# Patient Record
Sex: Male | Born: 1945
Health system: Southern US, Community
[De-identification: ages and names within clinical notes are randomized; demographics above are authoritative.]

## PROBLEM LIST (undated history)

## (undated) HISTORY — PX: TONSILLECTOMY AND ADENOIDECTOMY: SUR1326

---

## 1998-04-12 HISTORY — PX: BACK SURGERY: SHX140

## 1999-04-11 ENCOUNTER — Emergency Department (HOSPITAL_COMMUNITY): Admission: EM | Admit: 1999-04-11 | Discharge: 1999-04-11 | Payer: Self-pay | Admitting: Emergency Medicine

## 1999-04-11 ENCOUNTER — Encounter: Payer: Self-pay | Admitting: Emergency Medicine

## 1999-04-12 ENCOUNTER — Encounter: Payer: Self-pay | Admitting: Family Medicine

## 1999-04-12 ENCOUNTER — Ambulatory Visit (HOSPITAL_COMMUNITY): Admission: RE | Admit: 1999-04-12 | Discharge: 1999-04-12 | Payer: Self-pay | Admitting: Family Medicine

## 1999-04-26 ENCOUNTER — Encounter: Payer: Self-pay | Admitting: Neurosurgery

## 1999-04-26 ENCOUNTER — Ambulatory Visit (HOSPITAL_COMMUNITY): Admission: RE | Admit: 1999-04-26 | Discharge: 1999-04-26 | Payer: Self-pay | Admitting: Neurosurgery

## 2001-03-06 ENCOUNTER — Encounter: Payer: Self-pay | Admitting: Family Medicine

## 2001-03-06 ENCOUNTER — Encounter: Admission: RE | Admit: 2001-03-06 | Discharge: 2001-03-06 | Payer: Self-pay | Admitting: Family Medicine

## 2001-11-19 ENCOUNTER — Ambulatory Visit (HOSPITAL_COMMUNITY): Admission: RE | Admit: 2001-11-19 | Discharge: 2001-11-19 | Payer: Self-pay | Admitting: Orthopedic Surgery

## 2001-11-19 ENCOUNTER — Encounter: Payer: Self-pay | Admitting: Orthopedic Surgery

## 2002-11-23 ENCOUNTER — Encounter: Payer: Self-pay | Admitting: Family Medicine

## 2002-11-23 LAB — CONVERTED CEMR LAB: PSA: 0.4 ng/mL

## 2004-04-20 ENCOUNTER — Ambulatory Visit: Payer: Self-pay | Admitting: Family Medicine

## 2004-05-08 ENCOUNTER — Ambulatory Visit: Payer: Self-pay | Admitting: Family Medicine

## 2004-11-09 ENCOUNTER — Ambulatory Visit: Payer: Self-pay | Admitting: Family Medicine

## 2005-10-14 ENCOUNTER — Ambulatory Visit: Payer: Self-pay | Admitting: Family Medicine

## 2007-02-09 ENCOUNTER — Encounter: Payer: Self-pay | Admitting: Family Medicine

## 2007-02-09 DIAGNOSIS — M1990 Unspecified osteoarthritis, unspecified site: Secondary | ICD-10-CM | POA: Insufficient documentation

## 2007-02-16 ENCOUNTER — Ambulatory Visit: Payer: Self-pay | Admitting: Family Medicine

## 2007-02-18 LAB — CONVERTED CEMR LAB
AST: 33 units/L (ref 0–37)
Albumin: 4 g/dL (ref 3.5–5.2)
Alkaline Phosphatase: 62 units/L (ref 39–117)
BUN: 18 mg/dL (ref 6–23)
Basophils Absolute: 0.1 10*3/uL (ref 0.0–0.1)
Chloride: 106 meq/L (ref 96–112)
Cholesterol: 198 mg/dL (ref 0–200)
Creatinine, Ser: 0.9 mg/dL (ref 0.4–1.5)
Eosinophils Absolute: 0.2 10*3/uL (ref 0.0–0.6)
GFR calc non Af Amer: 91 mL/min
HDL: 52.8 mg/dL (ref >39.0)
LDL Cholesterol: 134 mg/dL — ABNORMAL HIGH (ref 0–99)
MCHC: 35.2 g/dL (ref 30.0–36.0)
MCV: 86.5 fL (ref 78.0–100.0)
Monocytes Relative: 6.9 % (ref 3.0–11.0)
Neutrophils Relative %: 68.6 % (ref 43.0–77.0)
PSA: 0.61 ng/mL (ref 0.10–4.00)
Platelets: 214 10*3/uL (ref 150–400)
Potassium: 3.7 meq/L (ref 3.5–5.1)
RBC: 4.94 M/uL (ref 4.22–5.81)
RDW: 12 % (ref 11.5–14.6)
Sodium: 143 meq/L (ref 135–145)
TSH: 1 microintl units/mL (ref 0.35–5.50)
Total Bilirubin: 1 mg/dL (ref 0.3–1.2)
Total CHOL/HDL Ratio: 3.8
Triglycerides: 55 mg/dL (ref 0–149)

## 2007-03-04 ENCOUNTER — Encounter: Payer: Self-pay | Admitting: Family Medicine

## 2007-03-31 ENCOUNTER — Ambulatory Visit: Payer: Self-pay | Admitting: Family Medicine

## 2007-04-01 ENCOUNTER — Encounter (INDEPENDENT_AMBULATORY_CARE_PROVIDER_SITE_OTHER): Payer: Self-pay | Admitting: *Deleted

## 2007-04-20 ENCOUNTER — Ambulatory Visit: Payer: Self-pay | Admitting: Family Medicine

## 2007-04-20 DIAGNOSIS — E782 Mixed hyperlipidemia: Secondary | ICD-10-CM | POA: Insufficient documentation

## 2007-04-20 DIAGNOSIS — I10 Essential (primary) hypertension: Secondary | ICD-10-CM

## 2007-07-20 ENCOUNTER — Ambulatory Visit: Payer: Self-pay | Admitting: Family Medicine

## 2007-09-22 ENCOUNTER — Ambulatory Visit: Payer: Self-pay | Admitting: Family Medicine

## 2007-09-24 LAB — CONVERTED CEMR LAB
HDL: 46.1 mg/dL (ref >39.0)
Triglycerides: 42 mg/dL (ref 0–149)
VLDL: 8 mg/dL (ref 0–40)

## 2008-01-21 ENCOUNTER — Ambulatory Visit: Payer: Self-pay | Admitting: Family Medicine

## 2008-07-14 ENCOUNTER — Encounter (INDEPENDENT_AMBULATORY_CARE_PROVIDER_SITE_OTHER): Payer: Self-pay | Admitting: *Deleted

## 2008-07-18 ENCOUNTER — Ambulatory Visit: Payer: Self-pay | Admitting: Family Medicine

## 2008-07-26 ENCOUNTER — Ambulatory Visit: Payer: Self-pay | Admitting: Family Medicine

## 2008-07-27 LAB — CONVERTED CEMR LAB
ALT: 25 units/L (ref 0–53)
AST: 27 units/L (ref 0–37)
Alkaline Phosphatase: 59 units/L (ref 39–117)
BUN: 16 mg/dL (ref 6–23)
Bilirubin, Direct: 0.1 mg/dL (ref 0.0–0.3)
Calcium: 9 mg/dL (ref 8.4–10.5)
Cholesterol: 191 mg/dL (ref 0–200)
Creatinine, Ser: 1 mg/dL (ref 0.4–1.5)
Eosinophils Relative: 5.1 % — ABNORMAL HIGH (ref 0.0–5.0)
GFR calc non Af Amer: 80.27 mL/min (ref >60)
LDL Cholesterol: 129 mg/dL — ABNORMAL HIGH (ref 0–99)
Lymphocytes Relative: 22 % (ref 12.0–46.0)
Monocytes Absolute: 0.5 10*3/uL (ref 0.1–1.0)
Monocytes Relative: 9.1 % (ref 3.0–12.0)
Neutrophils Relative %: 63.5 % (ref 43.0–77.0)
PSA: 1.01 ng/mL (ref 0.10–4.00)
Platelets: 168 10*3/uL (ref 150.0–400.0)
RBC: 5.3 M/uL (ref 4.22–5.81)
Total Bilirubin: 1.2 mg/dL (ref 0.3–1.2)
Total CHOL/HDL Ratio: 4
Triglycerides: 52 mg/dL (ref 0.0–149.0)
VLDL: 10.4 mg/dL (ref 0.0–40.0)
WBC: 5 10*3/uL (ref 4.5–10.5)

## 2008-08-05 ENCOUNTER — Ambulatory Visit: Payer: Self-pay | Admitting: Gastroenterology

## 2008-08-19 ENCOUNTER — Ambulatory Visit: Payer: Self-pay | Admitting: Gastroenterology

## 2009-01-17 ENCOUNTER — Ambulatory Visit: Payer: Self-pay | Admitting: Family Medicine

## 2009-01-18 ENCOUNTER — Ambulatory Visit: Payer: Self-pay | Admitting: Family Medicine

## 2009-01-18 LAB — CONVERTED CEMR LAB
ALT: 24 units/L (ref 0–53)
HDL: 52.9 mg/dL (ref >39.00)
LDL Cholesterol: 124 mg/dL — ABNORMAL HIGH (ref 0–99)
VLDL: 7.6 mg/dL (ref 0.0–40.0)

## 2009-07-19 ENCOUNTER — Ambulatory Visit: Payer: Self-pay | Admitting: Family Medicine

## 2009-07-19 LAB — CONVERTED CEMR LAB
ALT: 46 units/L (ref 0–53)
AST: 38 units/L — ABNORMAL HIGH (ref 0–37)
Albumin: 3.9 g/dL (ref 3.5–5.2)
Alkaline Phosphatase: 62 units/L (ref 39–117)
Basophils Relative: 0.3 % (ref 0.0–3.0)
CO2: 31 meq/L (ref 19–32)
Calcium: 9.2 mg/dL (ref 8.4–10.5)
GFR calc non Af Amer: 80.01 mL/min (ref >60)
Hemoglobin: 15.7 g/dL (ref 13.0–17.0)
Lymphocytes Relative: 20.8 % (ref 12.0–46.0)
Monocytes Relative: 9.2 % (ref 3.0–12.0)
Neutro Abs: 3 10*3/uL (ref 1.4–7.7)
RBC: 5.16 M/uL (ref 4.22–5.81)
Sodium: 144 meq/L (ref 135–145)
Total CHOL/HDL Ratio: 3
Total Protein: 6.4 g/dL (ref 6.0–8.3)
Triglycerides: 45 mg/dL (ref 0.0–149.0)

## 2009-08-21 ENCOUNTER — Ambulatory Visit: Payer: Self-pay | Admitting: Family Medicine

## 2009-08-21 DIAGNOSIS — R7309 Other abnormal glucose: Secondary | ICD-10-CM

## 2009-09-10 HISTORY — PX: LIPOMA EXCISION: SHX5283

## 2009-09-27 ENCOUNTER — Ambulatory Visit (HOSPITAL_BASED_OUTPATIENT_CLINIC_OR_DEPARTMENT_OTHER): Admission: RE | Admit: 2009-09-27 | Discharge: 2009-09-27 | Payer: Self-pay | Admitting: General Surgery

## 2009-11-08 ENCOUNTER — Ambulatory Visit: Payer: Self-pay | Admitting: Family Medicine

## 2009-11-08 LAB — CONVERTED CEMR LAB
Glucose, Synovial Fluid: 90 mg/dL
Monocyte/Macrophage: 9 % — ABNORMAL LOW (ref 50–90)
Neutrophil, Synovial: 69 % — ABNORMAL HIGH (ref 0–25)

## 2009-11-11 ENCOUNTER — Ambulatory Visit: Payer: Self-pay | Admitting: Family Medicine

## 2009-11-30 ENCOUNTER — Ambulatory Visit: Payer: Self-pay | Admitting: Family Medicine

## 2009-12-04 LAB — CONVERTED CEMR LAB: Crystals, Fluid: NONE SEEN

## 2010-02-19 ENCOUNTER — Ambulatory Visit: Payer: Self-pay | Admitting: Family Medicine

## 2010-02-19 LAB — CONVERTED CEMR LAB
Glucose, Bld: 95 mg/dL (ref 70–99)
HDL: 56.8 mg/dL (ref >39.00)
LDL Cholesterol: 125 mg/dL — ABNORMAL HIGH (ref 0–99)
Total CHOL/HDL Ratio: 3
Triglycerides: 47 mg/dL (ref 0.0–149.0)

## 2010-02-21 ENCOUNTER — Ambulatory Visit: Payer: Self-pay | Admitting: Family Medicine

## 2010-04-30 ENCOUNTER — Ambulatory Visit: Payer: Self-pay | Admitting: Family Medicine

## 2010-04-30 DIAGNOSIS — M67919 Unspecified disorder of synovium and tendon, unspecified shoulder: Secondary | ICD-10-CM | POA: Insufficient documentation

## 2010-04-30 DIAGNOSIS — M719 Bursopathy, unspecified: Secondary | ICD-10-CM

## 2010-05-02 ENCOUNTER — Encounter
Admission: RE | Admit: 2010-05-02 | Discharge: 2010-05-02 | Payer: Self-pay | Source: Home / Self Care | Attending: Family Medicine | Admitting: Family Medicine

## 2010-05-13 HISTORY — PX: ROTATOR CUFF REPAIR: SHX139

## 2010-05-23 ENCOUNTER — Ambulatory Visit: Admit: 2010-05-23 | Payer: Self-pay | Admitting: Family Medicine

## 2010-05-24 ENCOUNTER — Encounter (INDEPENDENT_AMBULATORY_CARE_PROVIDER_SITE_OTHER): Payer: Self-pay | Admitting: *Deleted

## 2010-05-28 ENCOUNTER — Ambulatory Visit: Admit: 2010-05-28 | Payer: Self-pay | Admitting: Family Medicine

## 2010-05-28 ENCOUNTER — Encounter: Payer: Self-pay | Admitting: Family Medicine

## 2010-06-12 NOTE — Assessment & Plan Note (Signed)
Summary: FOLLOW UP AFTER LABS / LFW   Vital Signs:  Patient profile:   65 year old male Height:      73 inches Weight:      239.50 pounds BMI:     31.71 Temp:     97.8 degrees F oral Pulse rate:   64 / minute Pulse rhythm:   regular BP sitting:   124 / 80  (left arm) Cuff size:   large  Vitals Entered By: Lewanda Rife LPN (August 21, 2009 4:04 PM) CC: follow up after labs   History of Present Illness: here for f/u for chronic health problems and to review health mt list   feeling good -- overall working very hard  keeping schedule is harder than it used to be -- is hoping to cut back to 4 days per week  does not think he will ever totally retire   wt is up 4 lb with bmi 31 had been doing well with wt loss is not as good with his habits -- cuttng back some but not enough  he feels comfortable at this wt   eating has not gotten any worse  not as much exercise --  walks 5 miles per day at work he thinks - does try to get his heartrate up   bp great at 124/80-- this is stable with no problems   colonosc ok 2010- due 5 y  psa 1.91  no prostate problems  nocturia times one max -only if he drinks a lot  no problems with stream   Td 04   lipids up a little with LDL in 130s from 120s diet   sugar 111 - borderliine   ALT just a little high  drinks a lot of coffee with sugar and cream   Allergies: 1)  Parafon Forte  Past History:  Past Medical History: Last updated: 07/18/2008 Osteoarthritis HTN-- imp with diet and exercise/wt loss  hyperlipidemia- diet controlled  obesity  Past Surgical History: Last updated: 08/21/2008 Tonsillectomy MRI- spine- herniated disk (04/12/1999) Back surgery (04/26/1999) Colonoscopy (1999) Colonoscopy- diverticulosis, hemorrhoids (12/2002) (4/10) colonosc- tics, re check 5 y  Family History: Last updated: 2009-02-02 Father: deceased- cancer ? intestine, lung, CHF Mother: deceased- DM, alzheimer's, TIA's Siblings: 2  brothers, 1 sister sister high cholesterol Brother HTN, GERD sister breast ca brother DM  Social History: Last updated: 01/21/2008 Marital Status: Married Children: 3 Occupation: Research scientist (life sciences) store  non smoker exercises regularly  Risk Factors: Smoking Status: never (02/09/2007)  Review of Systems General:  Denies fatigue, fever, loss of appetite, and malaise. Eyes:  Denies blurring and eye irritation. CV:  Denies chest pain or discomfort and palpitations. Resp:  Denies cough and pleuritic. GI:  Denies abdominal pain, bloody stools, and change in bowel habits. MS:  Denies joint pain, joint redness, joint swelling, and cramps. Derm:  Denies itching, lesion(s), poor wound healing, and rash. Neuro:  Denies numbness and tingling. Endo:  Denies cold intolerance, excessive thirst, excessive urination, and heat intolerance. Heme:  Denies abnormal bruising and bleeding.  Physical Exam  General:  overweight but generally well appearing  Head:  normocephalic, atraumatic, and no abnormalities observed.   Eyes:  vision grossly intact, pupils equal, pupils round, and pupils reactive to light.  no conjunctival pallor, injection or icterus  Ears:  R ear normal and L ear normal.   Nose:  no nasal discharge.   Mouth:  pharynx pink and moist.   Neck:  supple with full rom and no masses  or thyromegally, no JVD or carotid bruit  Lungs:  Normal respiratory effort, chest expands symmetrically. Lungs are clear to auscultation, no crackles or wheezes. Heart:  Normal rate and regular rhythm. S1 and S2 normal without gallop, murmur, click, rub or other extra sounds. Abdomen:  Bowel sounds positive,abdomen soft and non-tender without masses, organomegaly or hernias noted. no renal bruits  Rectal:  No external abnormalities noted. Normal sphincter tone. No rectal masses or tenderness. Prostate:  Prostate gland firm and smooth, no enlargement, nodularity, tenderness, mass, asymmetry or induration. Msk:  No  deformity or scoliosis noted of thoracic or lumbar spine.  no acute joint changes  Pulses:  R and L carotid,radial,femoral,dorsalis pedis and posterior tibial pulses are full and equal bilaterally Extremities:  No clubbing, cyanosis, edema, or deformity noted with normal full range of motion of all joints.   Neurologic:  sensation intact to light touch, gait normal, and DTRs symmetrical and normal.   Skin:  Intact without suspicious lesions or rashes some lentigos baseline large lipoma on mid back that is nontender  Cervical Nodes:  No lymphadenopathy noted Inguinal Nodes:  No significant adenopathy Psych:  normal affect, talkative and pleasant    Impression & Recommendations:  Problem # 1:  SPECIAL SCREENING MALIGNANT NEOPLASM OF PROSTATE (ICD-V76.44) Assessment Unchanged psa is wnl and no change in exam/no symptoms  Problem # 2:  HYPERLIPIDEMIA (ICD-272.2) Assessment: Deteriorated  cholesterol is creeping up  disc diet change incl cutting fried foods and processed foods re check 6 mo and f/u has been very well diet controlled in past   Labs Reviewed: SGOT: 38 (07/19/2009)   SGPT: 46 (07/19/2009)   HDL:69.70 (07/19/2009), 52.90 (01/17/2009)  LDL:124 (01/17/2009), 129 (07/26/2008)  Chol:207 (07/19/2009), 184 (01/17/2009)  Trig:45.0 (07/19/2009), 38.0 (01/17/2009)  Problem # 3:  ESSENTIAL HYPERTENSION (ICD-401.9) Assessment: Unchanged  bp remains in good control with lifestyle at this time  rev labs with pt  will continue to monitor   BP today: 124/80 Prior BP: 100/68 (01/18/2009)  Labs Reviewed: K+: 4.0 (07/19/2009) Creat: : 1.0 (07/19/2009)   Chol: 207 (07/19/2009)   HDL: 69.70 (07/19/2009)   LDL: 124 (01/17/2009)   TG: 45.0 (07/19/2009)  Problem # 4:  LIPOMA, BACK (ICD-214.9) Assessment: Deteriorated bothering him more  he is interested in another surgeon opinion for this  Orders: Surgical Referral (Surgery)  Problem # 5:  HYPERGLYCEMIA (ICD-790.29) mild with  fasting sugar of 111- need to watch disc link between obesity and DM and his fam hx  disc risks assoc with DM - vasc and otherwise  disc healthy diet (low simple sugar/ choose complex carbs/ low sat fat) diet and exercise in detail  check AIC in 6 mo and f/u  Patient Instructions: 1)  we will do surgical referral at check out  2)  cut out sweets and sugar - your sugar level is a bit high  3)  artificial sweetner is ok  4)  also cholesterol is up - watch the fats  5)  work on weight loss  6)  add 20 minutes of extra exercise 5 days per week  7)  schedule fasting labs and then follow up in 6 months lipid/ast/alt/glucose and AIC for 272 and hyperglycemia   Prior Medications (reviewed today): None Current Allergies (reviewed today): PARAFON FORTE

## 2010-06-12 NOTE — Assessment & Plan Note (Signed)
Summary: SWOLLEN ELBOW/CLE   Vital Signs:  Patient profile:   65 year old male Height:      73 inches Weight:      236.38 pounds BMI:     31.30 Temp:     97.7 degrees F oral Pulse rate:   68 / minute Pulse rhythm:   regular BP sitting:   120 / 80  (right arm) Cuff size:   regular  Vitals Entered By: Linde Gillis CMA Duncan Dull) (November 08, 2009 2:48 PM) CC: swollen left elbow   History of Present Illness: 65 yo here for left swollen, red elbow joint.  Started about two weeks ago, outside of elbow a little swollen, slowly increasing in size.  Now red and a little warm to touch.  Not very painful to touch.  Has FROM of joint.  No h/o gout. No fevers, chills, nausea vomiting. No tick bites, does not participate in high risk sexual activity or drug use.  Does sometimes get "pimples" on his elbow.  He thinks he had one prior to this starting.  Current Medications (verified): 1)  None  Allergies: 1)  Parafon Forte  Past History:  Past Medical History: Last updated: 07/18/2008 Osteoarthritis HTN-- imp with diet and exercise/wt loss  hyperlipidemia- diet controlled  obesity  Past Surgical History: Last updated: 09/27/2009 Tonsillectomy MRI- spine- herniated disk (04/12/1999) Back surgery (04/26/1999) Colonoscopy (1999) Colonoscopy- diverticulosis, hemorrhoids (12/2002) (4/10) colonosc- tics, re check 5 y 5/11 large lipoma removal on back   Family History: Last updated: January 28, 2009 Father: deceased- cancer ? 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 GI:  Denies nausea and vomiting. MS:   Complains of joint pain, joint redness, and joint swelling; denies loss of strength.  Physical Exam  General:  overweight but generally well appearing  Msk:  left elbow- moderate effusion, erythema and warm to touch.  Not tender to palpation.  FROM. Psych:  normal affect, talkative and pleasant    Impression & Recommendations:  Problem # 1:  EFFUSION OF UPPER ARM JOINT (ICD-719.02) Assessment New Based on exam and appearance of fluid, inflammatory versus gouty arthritis.  Does not appear consistent with bacterial or septic joint.  Pt is afebrile, has FROM of elbow and no signs of other systemic infection.  Will treat with dose IM CTX empirically and send for fluid analysis.  Will also check CBC and uric acid. Orders: T-Fluid, Synovial Panel ([id number]) Venipuncture (54098) TLB-Uric Acid, Blood (84550-URIC) Specimen Handling (11914) Rocephin  250mg  (N8295) Admin of Therapeutic Inj  intramuscular or subcutaneous (62130)  Prior Medications (reviewed today): None Current Allergies (reviewed today): PARAFON FORTE   Procedure Note Last Tetanus: Td (11/22/2002)  Injections: Duration of symptoms: 2 weeks  Procedure # 1: joint aspiration & injection    Location: left elbow    Comment: 15 cc of clear yellow fluid removed.  Pt tolerated procedure well.  No bleeding.  Cleaned and prepped with: alcohol and betadine Wound dressing: bandaid   Medication Administration  Injection # 1:    Medication: Rocephin  250mg     Diagnosis: EFFUSION OF UPPER ARM JOINT (ICD-719.02)    Route: IM    Site:  LUOQ gluteus    Exp Date: 05/13/2012    Lot #: [id number]    Mfr: Novaplus    Comments: 1 Gram given    Patient tolerated injection without complications    Given by: Linde Gillis CMA Duncan Dull) (November 08, 2009 3:18 PM)  Orders Added: 1)  T-Fluid, Synovial Panel [[id number]] 2)  Venipuncture [04540] 3)  TLB-Uric Acid, Blood [84550-URIC] 4)  Specimen Handling [99000] 5)  Rocephin   250mg  [J0696] 6)  Admin of Therapeutic Inj  intramuscular or subcutaneous [96372] 7)  Est. Patient Level IV [98119]

## 2010-06-12 NOTE — Letter (Signed)
Summary: Out of Work  [facility]  [address] [place], [place] [address]   Phone: [phone]  Fax: [phone]    November 08, 2009   Employee:  Troy [name]    To Whom It May Concern:   For Medical reasons, please excuse the above named employee from work for the following dates:  Start:   11/08/2009  End:   May return to work on 11/09/2009  If you need additional information, please feel free to contact our office.         Sincerely,        Ruthe Mannan, MD

## 2010-06-12 NOTE — Assessment & Plan Note (Signed)
Summary: F/U AFTER LABS / LFW   Vital Signs:  Patient profile:   65 year old male Height:      73 inches Weight:      239.25 pounds BMI:     31.68 Temp:     98 degrees F oral Pulse rate:   68 / minute Pulse rhythm:   regular BP sitting:   144 / 86  (left arm) Cuff size:   regular  Vitals Entered By: Lewanda Rife LPN (February 21, 2010 4:27 PM)  Serial Vital Signs/Assessments:  Time      Position  BP       Pulse  Resp  Temp     By                     132/85                         Judith Part MD  CC: six month f/u after labs, Hypertension Management   History of Present Illness: here for f/u of HTN and lipids and hyperglycemia   is tired but feeling ok overall  trying to take care of himself  eats healthy half the time   does fair but cheats now and then   wt is up 4 lb   bp is 144/86  lipids trig 47 and HDL 56 and LDL 125  (from 161)   AIC 6.0 sweets are his downfall  has some at the house no sugar drinks except some sugar in coffee  strong family hx of diabetes  eats fair amount of starches too  really likes potato- does not have a lot of it    Hypertension History:      He complains of chest pain.        Positive major cardiovascular risk factors include male age 80 years old or older, hyperlipidemia, and hypertension.  Negative major cardiovascular risk factors include non-tobacco-user status.    Allergies: 1)  Parafon Forte  Past History:  Past Medical History: Last updated: 07/18/2008 Osteoarthritis HTN-- imp with diet and exercise/wt loss  hyperlipidemia- diet controlled  obesity  Past Surgical History: Last updated: 09/27/2009 Tonsillectomy MRI- spine- herniated disk (04/12/1999) Back surgery (04/26/1999) Colonoscopy (1999) Colonoscopy- diverticulosis, hemorrhoids (12/2002) (4/10) colonosc- tics, re check 5 y 5/11 large lipoma removal on back   Family History: Last updated: 02-02-09 Father: deceased- cancer ? 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 Eyes:  Denies blurring and eye irritation. CV:  Denies chest pain or discomfort and palpitations. Resp:  Denies cough, shortness of breath, and wheezing. GI:  Denies indigestion and nausea. GU:  Denies urinary frequency. MS:  Denies joint pain, joint redness, and joint swelling. Derm:  Denies itching, lesion(s), poor wound healing, and rash. Neuro:  Denies headaches, numbness, and tingling. Psych:  mood is ok . Endo:  Denies excessive hunger, excessive thirst, excessive urination, and heat intolerance. Heme:  Denies abnormal bruising and bleeding.  Physical Exam  General:  Well-developed,well-nourished,in no acute distress; alert,appropriate and cooperative throughout examination Head:  normocephalic, atraumatic, and no abnormalities observed.   Eyes:  vision  grossly intact, pupils equal, pupils round, and pupils reactive to light.  no conjunctival pallor, injection or icterus  Mouth:  pharynx pink and moist.   Neck:  supple with full rom and no masses or thyromegally, no JVD or carotid bruit  Chest Wall:  No deformities, masses, tenderness or gynecomastia noted. Lungs:  Normal respiratory effort, chest expands symmetrically. Lungs are clear to auscultation, no crackles or wheezes. Heart:  Normal rate and regular rhythm. S1 and S2 normal without gallop, murmur, click, rub or other extra sounds. Abdomen:  Bowel sounds positive,abdomen soft and non-tender without masses, organomegaly or hernias noted. no renal bruits  Msk:  No deformity or scoliosis noted of  thoracic or lumbar spine.   Pulses:  R and L carotid,radial,femoral,dorsalis pedis and posterior tibial pulses are full and equal bilaterally Extremities:  No clubbing, cyanosis, edema, or deformity noted with normal full range of motion of all joints.   Neurologic:  sensation intact to light touch, gait normal, and DTRs symmetrical and normal.   Skin:  Intact without suspicious lesions or rashes Cervical Nodes:  No lymphadenopathy noted Inguinal Nodes:  No significant adenopathy Psych:  normal affect, talkative and pleasant   Diabetes Management Exam:    Foot Exam (with socks and/or shoes not present):       Sensory-Pinprick/Light touch:          Left medial foot (L-4): normal          Left dorsal foot (L-5): normal          Left lateral foot (S-1): normal          Right medial foot (L-4): normal          Right dorsal foot (L-5): normal          Right lateral foot (S-1): normal       Sensory-Monofilament:          Left foot: normal          Right foot: normal       Inspection:          Left foot: normal          Right foot: normal       Nails:          Left foot: normal          Right foot: normal   Impression & Recommendations:  Problem # 1:  HYPERLIPIDEMIA (ICD-272.2) cholesterol up a bit  had long disc about low sat fat diet  lab and f/u in 3 months  Problem # 2:  ESSENTIAL HYPERTENSION (ICD-401.9) Assessment: Unchanged bp is overall stable - better on second check  disc imp of exercise/ wt loss and low salt diet  BP today: 144/86-- re check 132/85 at rest  Prior BP: 124/80 (11/30/2009)  Labs Reviewed: K+: 4.0 (07/19/2009) Creat: : 1.0 (07/19/2009)   Chol: 191 (02/19/2010)   HDL: 56.80 (02/19/2010)   LDL: 125 (02/19/2010)   TG: 47.0 (02/19/2010)  Problem # 3:  HYPERGLYCEMIA (ICD-790.29) Assessment: Deteriorated  this is worse-- had long disc about need to get back on track with wt loss  disc healthy diet (low simple sugar/ choose complex carbs/ low sat fat) diet  and exercise in detail  will re check lab and f/u in 3 mon  Labs Reviewed: Creat: 1.0 (07/19/2009)     Other Orders: Admin 1st Vaccine (01027) Flu Vaccine 1yrs + (25366)  Hypertension Assessment/Plan:      The patient's hypertensive risk group is  category B: At least one risk factor (excluding diabetes) with no target organ damage.  His calculated 10 year risk of coronary heart disease is 18 %.  Today's blood pressure is 144/86.     Patient Instructions: 1)  work hard on weight loss 2)  It is important that you exercise reguarly at least 20 minutes 5 times a week. If you develop chest pain, have severe difficulty breathing, or feel very tired, stop exercising immediately and seek medical attention.  3)  cut out sweets entirely 4)  keep carbohydrates servings small -- 2-3 oz (bread/ pasta/potato/ fruit) 5)  also no sugar drinks - diet is ok  6)  schedule fasting labs and then follow up in 3 months  7)  lipid/ast/alt  / AIC  / cbc with diff and tsh ,272 and hyperglycemia , and 401.1  Prior Medications (reviewed today): None Current Allergies (reviewed today): PARAFON FORTE    Flu Vaccine Consent Questions     Do you have a history of severe allergic reactions to this vaccine? no    Any prior history of allergic reactions to egg and/or gelatin? no    Do you have a sensitivity to the preservative Thimersol? no    Do you have a past history of Guillan-Barre Syndrome? no    Do you currently have an acute febrile illness? no    Have you ever had a severe reaction to latex? no    Vaccine information given and explained to patient? yes    Are you currently pregnant? no    Lot Number:[id number]   Exp Date:11/10/2010   Site Given  Right Deltoid IMlbflu Lewanda Rife LPN  February 21, 2010 5:12 PM

## 2010-06-12 NOTE — Assessment & Plan Note (Signed)
Summary: F/U ELBOW,SWOLLEN,RED/CLE   Vital Signs:  Patient profile:   65 year old male Height:      73 inches Weight:      235.38 pounds BMI:     31.17 Temp:     97.9 degrees F oral Pulse rate:   64 / minute Pulse rhythm:   regular BP sitting:   124 / 80  (left arm) Cuff size:   regular  Vitals Entered By: Lewanda Rife LPN (November 30, 2009 3:29 PM) CC: f/u lt elbow still swollen and red   History of Present Illness: 65 yo here for recurrent olecranon bursitis.  Was last seen on 7/2. Elbow has been aspirated twice. Received 10 days of Keflex.  Elbow seemed fine until 3 days after finishing abx. Fluid recummulated and became red and warm again.  FROM of joint.  Last aspiration showed: 15 cc of hazy, yellow fluid removed that showed 1255 cu WBC, 69% neutrophils, rare gram stain, no growth x 2 days.  Uric acid was negative.  Given IM CTX x 1 in office and 10 day course of Keflex.    No fevers, chills, nausea,vomiting or other systemic symptoms.  Current Medications (verified): 1)  Bactrim Ds 800-160 Mg Tabs (Sulfamethoxazole-Trimethoprim) .Marland Kitchen.. 1 Tab By Mouth Two Times A Day X 10 Days  Allergies: 1)  Parafon Forte  Review of Systems General:  Denies chills and fever. MS:  Complains of joint pain, joint redness, and joint swelling.  Physical Exam  General:  overweight but generally well appearing  Msk:  left elbow- moderate effusion, with warmth and erythema- appears smaller than last time. FROM, NTTP Psych:  normal affect, talkative and pleasant    Impression & Recommendations:  Problem # 1:  BURSITIS, LEFT ELBOW (ICD-726.33) Assessment Deteriorated 10 cc of yellowish, clear fluid removed. Tolerated procedure well. Consired injecting with steroid today but given how red and warm it was, will wait. Treated with Bactrim DS x 2 days. Advised that if fluid returns, either injection with steroid or referral to surgery.  Complete Medication List: 1)  Bactrim Ds  800-160 Mg Tabs (Sulfamethoxazole-trimethoprim) .Marland Kitchen.. 1 tab by mouth two times a day x 10 days  Other Orders: T-Fluid, Synovial Panel ([id number]) Prescriptions: BACTRIM DS 800-160 MG TABS (SULFAMETHOXAZOLE-TRIMETHOPRIM) 1 tab by mouth two times a day x 10 days  #20 x 0   Entered and Authorized by:   Ruthe Mannan MD   Signed by:   Ruthe Mannan MD on 11/30/2009   Method used:   Electronically to        [facility] [address] [id number]       [address]       [place], [place]  [address]       Ph: [phone]       Fax: [phone]   RxID:   [id number]    Procedure Note Last Tetanus: Td (11/22/2002)  Injections: The patient complains of redness, inflammation, and swelling but denies fever.  Procedure # 1: joint aspiration     Location: left olecranon  Cleaned and prepped with: alcohol and betadine Wound dressing: bandaid  Prior Medications (reviewed today): None Current Allergies (reviewed today): PARAFON FORTE  Appended Document: F/U ELBOW,SWOLLEN,RED/CLE

## 2010-06-12 NOTE — Assessment & Plan Note (Signed)
Summary: fluid on elbow   Vital Signs:  Patient profile:   65 year old male Weight:      233 pounds Temp:     97.7 degrees F oral Pulse rate:   72 / minute Pulse rhythm:   regular BP sitting:   130 / 80  (right arm) Cuff size:   regular  Vitals Entered By: Lowella Petties CMA (November 11, 2009 9:03 AM) CC: Fluid on elbow   History of Present Illness: 65 yo here for recurrent fluid accumulation on left swollen, red elbow joint.  Was seen on 6/29 for red, warm, swollen elbow.  15 cc of hazy, yellow fluid removed that showed 1255 cu WBC, 69% neutrophils, rare gram stain, no growth x 2 days.  Uric acid was negative.  Given IM CTX x 1 in office and 10 day course of Keflex.  Elbow is no longer red or warm but fluid reaccumulated 2 days ago and is uncomfortable.  No fevers, chills, nausea,vomiting or other systemic symptoms.  Current Medications (verified): 1)  Keflex 250 Mg Caps (Cephalexin) .Marland Kitchen.. 1 Tab Every 6 Hours X 10 Days Dispense Qs  Allergies (verified): 1)  Parafon Forte  Review of Systems      See HPI General:  Denies chills and fever. GI:  Denies nausea and vomiting.  Physical Exam  General:  overweight but generally well appearing  Msk:  left elbow- moderate effusion, erythema and warmth have resolved.  Not tender to palpation.  FROM. Psych:  normal affect, talkative and pleasant    Impression & Recommendations:  Problem # 1:  EFFUSION OF UPPER ARM JOINT (ICD-719.02) Assessment Improved  Elbow looks better, FROM and culture not consistent with septic joint. Finish course of abx, call us if any worsening symptoms over the weekend.  Orders: Joint Aspirate / Injection, Intermediate (22025)  Complete Medication List: 1)  Keflex 250 Mg Caps (Cephalexin) .Marland Kitchen.. 1 tab every 6 hours x 10 days dispense qs  Procedure Note Last Tetanus: Td (11/22/2002)  Cyst Removal: The patient complains of swelling and changing mole.    Location: left  elbow  Injections:  Procedure # 1: joint aspiration & injection    Location: left elbow    Comment: apirated 15 cc of bloody fluid, tolerated procedure well.  Cleaned and prepped with: betadine Wound dressing: bandaid

## 2010-06-14 NOTE — Letter (Signed)
Summary: [place] No Show Letter  [place] at [facility]  [address] [place], [place] [address]   Phone: [phone]  Fax: [phone]    05/24/2010 MRN: [id number]  [facility] [place] [address] [place], [place]  [address]   Dear Mr. Burdin,   Our records indicate that you missed your scheduled appointment with ____Laboratory_________________ on __1.11.2012__________.  Please contact this office to reschedule your appointment as soon as possible.  It is important that you keep your scheduled appointments with your physician, so we can provide you the best care possible.  Please be advised that there may be a charge for "no show" appointments.    Sincerely,   Bertha at [facility]

## 2010-06-14 NOTE — Assessment & Plan Note (Signed)
Summary: SHOULDER PAIN/CLE   Vital Signs:  Patient profile:   65 year old male Height:      73 inches Weight:      242.50 pounds BMI:     32.11 Temp:     97.8 degrees F oral Pulse rate:   68 / minute Pulse rhythm:   regular BP sitting:   140 / 90  (left arm) Cuff size:   regular  Vitals Entered By: Benny Lennert CMA Duncan Dull) (April 30, 2010 11:02 AM)  History of Present Illness: Chief complaint left shoulder pain for 1 1/2 months(hurt it lifting boxes at work)  DOI: 02/16/2010 per his recall. Exact date of injury may have been different. Filed with worker's comp at his employer.  65 year old presents with left sided shoulder pain:  Patient was initially seen through [facility]. Reportedly x-rays were negative.   Per his report, the WC case was closed.  left shoulder: The patient still complains of lateral pain. He is a great deal of difficulty abducting his shoulder. minimal flexion as well. Deltoid function remains intact. Is having some loss of motion and is able to have some difficulty with reaching around in his lower back region as well.  No prior traumatic fracture dislocation. No prior operative intervention. No fractures.  REVIEW OF SYSTEMS  GEN: No systemic complaints, no fevers, chills, sweats, or other acute illnesses MSK: Detailed in the HPI GI: tolerating PO intake without difficulty Neuro: No numbness, parasthesias, or tingling associated. Otherwise the pertinent positives of the ROS are noted above.    Allergies: 1)  Parafon Forte  Past History:  Past medical, surgical, family and social histories (including risk factors) reviewed, and no changes noted (except as noted below).  Past Medical History: Reviewed history from 07/18/2008 and no changes required. Osteoarthritis HTN-- imp with diet and exercise/wt loss  hyperlipidemia- diet controlled  obesity  Past Surgical History: Reviewed history from 09/27/2009 and no changes  required. Tonsillectomy MRI- spine- herniated disk (04/12/1999) Back surgery (04/26/1999) Colonoscopy (1999) Colonoscopy- diverticulosis, hemorrhoids (12/2002) (4/10) colonosc- tics, re check 5 y 5/11 large lipoma removal on back   Family History: Reviewed history from 01/18/2009 and no changes required. Father: deceased- cancer ? intestine, lung, CHF Mother: deceased- DM, alzheimer's, TIA's Siblings: 2 brothers, 1 sister sister high cholesterol Brother HTN, GERD sister breast ca brother DM  Social History: Reviewed history from 01/21/2008 and no changes required. Marital Status: Married Children: 3 Occupation: Scientist, physiological  non smoker exercises regularly  Physical Exam  General:  GEN: Well-developed,well-nourished,in no acute distress; alert,appropriate and cooperative throughout examination HEENT: Normocephalic and atraumatic without obvious abnormalities. No apparent alopecia or balding. Ears, externally no deformities PULM: Breathing comfortably in no respiratory distress EXT: No clubbing, cyanosis, or edema PSYCH: Normally interactive. Cooperative during the interview. Pleasant. Friendly and conversant. Not anxious or depressed appearing. Normal, full affect.  Msk:  Shoulder: LEFT Inspection: dramatic elevation of the left scapula on partial abduction. Range of motion is rather limited. Ecchymosis/edema: neg  AC joint, scapula, clavicle: NT Cervical spine: NT, full ROM Spurling's: neg Abduction: 2/5 Flexion: 4/5 IR, full, lift-off: 4+/5 ER at neutral: 4+/5 AC crossover: neg Drop Test: POSITIVE Empty Can: UNABLE TO COMPLETE Supraspinatus insertion: mild-mod T Bicipital groove: NT Speed's: neg Yergason's: neg Sulcus sign: neg Scapular dyskinesis: NOTABLE  Neuro: Sensation intact Grip 5/5    Impression & Recommendations:  Problem # 1:  ROTATOR CUFF INJURY, LEFT SHOULDER (ICD-726.10) clinically consistent with full-thickness rotator cuff  tear, left  supraspinatus tendon versus high-grade partial thickness supraspinatus tendon rupture. Failure to improve, > 6 weeks  Recommend MRI to evaluate for definitive management, preoperative standpoint, to evaluate the integrity of the supraspinatus tendon and infraspinatus tendons in this case. Clinically suspicious for rotator cuff tear, full-thickness tear versus high-grade partial tear, positive drop test and minimal abduction on examination with classic appearing examination.  I believe that this injury occurred at the time of his accident per his report to me today on February 16, 2010. He reports no other injuries in and around that time that could relate to this case.  Orders: Radiology Referral (Radiology)  Patient Instructions: 1)  Referral Appointment Information 2)  Day/Date: 3)  Time: 4)  Place/MD: 5)  Address: 6)  Phone/Fax: 7)  Patient given appointment information. Information/Orders faxed/mailed.    Orders Added: 1)  Radiology Referral [Radiology] 2)  Est. Patient Level IV [[address]]    Prior Medications (reviewed today): None Current Allergies (reviewed today): PARAFON FORTE

## 2010-06-14 NOTE — Letter (Signed)
Summary: Generic Letter   at [facility]  [address] [place], [place] [address]   Phone: [phone]  Fax: [phone]    04/30/2010  [facility] [address] [place], [place]  [address]  TO WHOM IT MAY CONCERN:  Mr. Troy [name] relates to me that he sustained a work-related injury 02/16/2010 - he believes this is the correct date of injury. He injured his left shoulder and his symptoms persist.   I am suggesting to him that further evaluation and definitive management is needed in this case and suggested that he have his worker's Public affairs consultant in his company requisition all of my notes. 04/30/2010 office visit, which will include my recommendations.     Sincerely,   Hannah Beat MD

## 2010-06-14 NOTE — Consult Note (Signed)
Summary: [facility]  [facility]   Imported By: Maryln Gottron 06/06/2010 10:42:25  _____________________________________________________________________  External Attachment:    Type:   Image     Comment:   External Document

## 2010-07-04 ENCOUNTER — Encounter: Payer: Self-pay | Admitting: Family Medicine

## 2010-07-24 NOTE — Letter (Signed)
Summary: [facility]   [facility]   Imported By: Kassie Mends 07/13/2010 09:39:30  _____________________________________________________________________  External Attachment:    Type:   Image     Comment:   External Document

## 2010-07-30 LAB — DIFFERENTIAL
Eosinophils Absolute: 0.1 10*3/uL (ref 0.0–0.7)
Eosinophils Relative: 2 % (ref 0–5)
Lymphocytes Relative: 22 % (ref 12–46)
Lymphs Abs: 1.1 10*3/uL (ref 0.7–4.0)
Monocytes Absolute: 0.4 10*3/uL (ref 0.1–1.0)
Monocytes Relative: 8 % (ref 3–12)

## 2010-07-30 LAB — CBC
Hemoglobin: 14.7 g/dL (ref 13.0–17.0)
RBC: 4.86 MIL/uL (ref 4.22–5.81)
RDW: 12.8 % (ref 11.5–15.5)

## 2010-07-30 LAB — BASIC METABOLIC PANEL
Calcium: 8.9 mg/dL (ref 8.4–10.5)
Creatinine, Ser: 0.84 mg/dL (ref 0.4–1.5)
GFR calc Af Amer: >60 mL/min (ref >60)
GFR calc non Af Amer: >60 mL/min (ref >60)
Sodium: 141 mEq/L (ref 135–145)

## 2013-07-26 ENCOUNTER — Encounter: Payer: Self-pay | Admitting: Gastroenterology

## 2013-10-18 ENCOUNTER — Encounter (INDEPENDENT_AMBULATORY_CARE_PROVIDER_SITE_OTHER): Payer: Self-pay

## 2013-10-18 ENCOUNTER — Ambulatory Visit (INDEPENDENT_AMBULATORY_CARE_PROVIDER_SITE_OTHER): Payer: BC Managed Care – PPO | Admitting: Family Medicine

## 2013-10-18 ENCOUNTER — Encounter: Payer: Self-pay | Admitting: Family Medicine

## 2013-10-18 VITALS — BP 136/78 | HR 60 | Temp 97.8°F | Ht 73.0 in | Wt 240.5 lb

## 2013-10-18 DIAGNOSIS — I10 Essential (primary) hypertension: Secondary | ICD-10-CM

## 2013-10-18 DIAGNOSIS — R7309 Other abnormal glucose: Secondary | ICD-10-CM

## 2013-10-18 DIAGNOSIS — J309 Allergic rhinitis, unspecified: Secondary | ICD-10-CM

## 2013-10-18 DIAGNOSIS — E782 Mixed hyperlipidemia: Secondary | ICD-10-CM

## 2013-10-18 MED ORDER — FLUTICASONE PROPIONATE 50 MCG/ACT NA SUSP: 2.0000 | Freq: Every day | NASAL | Status: DC

## 2013-10-18 NOTE — Progress Notes (Signed)
Subjective:    Patient ID: Troy [name], male    DOB: Apr 11, 1946, 68 y.o.   MRN: [id number]  HPI 68 year old Male here with uri symptoms  Has had sinus symptoms for over a month  constant post nasal drip and congestion  He thinks he may have allergies  He has some facial pain now  The mucous is clear  No wheezing or trouble breathing  Some night time cough from drip  Takes occ benadryl (he does not like benadryl and cough drops)-these do help  occ feels feverish- but no actual fever  Sluggish    Of note-has not been here in 4 years  Says he has been doing well / still working  He has not been to a doctor  ? If he is interested in health mt - feels fine    Wt is down 2 lb with bmi of 31  bp is pretty good 136/78 BP Readings from Last 3 Encounters:  10/18/13 136/78  04/30/10 140/90  02/21/10 144/86    Has not had his cholesterol or blood sugar tested in a while    Lab Results  Component Value Date   CHOL 191 02/19/2010   HDL 56.80 02/19/2010   LDLCALC 125* 02/19/2010   LDLDIRECT 136.9 07/19/2009   TRIG 47.0 02/19/2010   CHOLHDL 3 02/19/2010    Lab Results  Component Value Date   HGBA1C 6.0 02/19/2010    Patient Active Problem List   Diagnosis Date Noted  . ROTATOR CUFF INJURY, LEFT SHOULDER 04/30/2010  . HYPERGLYCEMIA 08/21/2009  . HYPERLIPIDEMIA 04/20/2007  . ESSENTIAL HYPERTENSION 04/20/2007  . OSTEOARTHRITIS 02/09/2007   No past medical history on file. No past surgical history on file. History  Substance Use Topics  . Smoking status: Never Smoker   . Smokeless tobacco: Never Used  . Alcohol Use: No     Comment: quit over 39 years ago   No family history on file. Allergies  Allergen Reactions  . Chlorzoxazone     REACTION: dizzy   No current outpatient prescriptions on file prior to visit.   No current facility-administered medications on file prior to visit.      Review of Systems Review of Systems  Constitutional: Negative for fever,  appetite change, fatigue and unexpected weight change.  ENT pos for congestion and rhinorrhea and drip and sinus fullness  Eyes: Negative for pain and visual disturbance.  Respiratory: Negative for cough and shortness of breath.   Cardiovascular: Negative for cp or palpitations    Gastrointestinal: Negative for nausea, diarrhea and constipation.  Genitourinary: Negative for urgency and frequency.  Skin: Negative for pallor or rash   Neurological: Negative for weakness, light-headedness, numbness and headaches.  Hematological: Negative for adenopathy. Does not bruise/bleed easily.  Psychiatric/Behavioral: Negative for dysphoric mood. The patient is not nervous/anxious.         Objective:   Physical Exam  Constitutional: He appears well-developed and well-nourished. No distress.  HENT:  Head: Normocephalic and atraumatic.  Right Ear: External ear normal.  Left Ear: External ear normal.  Mouth/Throat: Oropharynx is clear and moist. No oropharyngeal exudate.  Nares are injected and congested  No sinus tenderness Clear rhinorrhea and post nasal drip   Eyes: Conjunctivae and EOM are normal. Pupils are equal, round, and reactive to light. Right eye exhibits no discharge. Left eye exhibits no discharge. No scleral icterus.  Neck: Normal range of motion. Neck supple.  Cardiovascular: Normal rate, regular rhythm, normal heart sounds  and intact distal pulses.  Exam reveals no gallop.   Pulmonary/Chest: Effort normal and breath sounds normal. No respiratory distress. He has no wheezes. He has no rales.  Abdominal: Soft. Bowel sounds are normal. He exhibits no distension and no mass. There is no tenderness.  Musculoskeletal: He exhibits no edema.  Lymphadenopathy:    He has no cervical adenopathy.  Neurological: He is alert. He has normal reflexes. No cranial nerve deficit. He exhibits normal muscle tone. Coordination normal.  Skin: Skin is warm and dry. No rash noted. No erythema.    Psychiatric: He has a normal mood and affect.          Assessment & Plan:   Problem List Items Addressed This Visit     Cardiovascular and Mediastinum   ESSENTIAL HYPERTENSION - Primary     Lost to f/u for a while Per pt - very well controlled w/o meds lately  Eats a healthy diet and has lost wt  Will continue healthy lifestyle and f/u for PE in late summer       Respiratory   Allergic rhinitis     With rhinorrhea/congestion and post nasal drip (no facial pain or purulent drainage)  Begin otc antihistamine- zyrtec daily  Sent px flonase to pharmacy-daily use through all season Update if not starting to improve in a week or if worsening  (esp if sympt of sinus inf)      Other   HYPERLIPIDEMIA     Pt controlling with good diet Will schedule fasting lab later in summer with PE     HYPERGLYCEMIA     No sympt of DM Better diet  Lost to f/u for 4 years  Check A1C and f/u for annual exam later this summer Disc low glycemic diet

## 2013-10-18 NOTE — Assessment & Plan Note (Signed)
No sympt of DM Better diet  Lost to f/u for 4 years  Check A1C and f/u for annual exam later this summer Disc low glycemic diet

## 2013-10-18 NOTE — Assessment & Plan Note (Signed)
Lost to f/u for a while Per pt - very well controlled w/o meds lately  Eats a healthy diet and has lost wt  Will continue healthy lifestyle and f/u for PE in late summer

## 2013-10-18 NOTE — Progress Notes (Signed)
Pre visit review using our clinic review tool, if applicable. No additional management support is needed unless otherwise documented below in the visit note. 

## 2013-10-18 NOTE — Patient Instructions (Signed)
Start an antihistamine over the counter daily - I like zyrtec or store brand zyrtec   (also allegra is good) I sent flonase to the pharmacy- use it every day through allergy season  Take care of yourself  Please schedule annual exam in late summer (30 min appt is ok) with labs prior

## 2013-10-18 NOTE — Assessment & Plan Note (Signed)
Pt controlling with good diet Will schedule fasting lab later in summer with PE

## 2013-10-18 NOTE — Assessment & Plan Note (Signed)
With rhinorrhea/congestion and post nasal drip (no facial pain or purulent drainage)  Begin otc antihistamine- zyrtec daily  Sent px flonase to pharmacy-daily use through all season Update if not starting to improve in a week or if worsening  (esp if sympt of sinus inf)

## 2013-12-05 ENCOUNTER — Telehealth: Payer: Self-pay | Admitting: Family Medicine

## 2013-12-05 DIAGNOSIS — Z125 Encounter for screening for malignant neoplasm of prostate: Secondary | ICD-10-CM | POA: Insufficient documentation

## 2013-12-05 DIAGNOSIS — R7309 Other abnormal glucose: Secondary | ICD-10-CM

## 2013-12-05 DIAGNOSIS — E782 Mixed hyperlipidemia: Secondary | ICD-10-CM

## 2013-12-05 DIAGNOSIS — I10 Essential (primary) hypertension: Secondary | ICD-10-CM

## 2013-12-05 NOTE — Telephone Encounter (Signed)
Message copied by Judy Pimple on Sun Dec 05, 2013 11:19 AM ------      Message from: Alvina Chou      Created: Tue Nov 30, 2013  2:31 PM      Regarding: Lab orders for Monday, 7.27.15       Patient is scheduled for CPX labs, please order future labs, Thanks , Terri       ------

## 2013-12-06 ENCOUNTER — Other Ambulatory Visit (INDEPENDENT_AMBULATORY_CARE_PROVIDER_SITE_OTHER): Payer: BC Managed Care – PPO

## 2013-12-06 DIAGNOSIS — R7309 Other abnormal glucose: Secondary | ICD-10-CM

## 2013-12-06 DIAGNOSIS — E782 Mixed hyperlipidemia: Secondary | ICD-10-CM

## 2013-12-06 DIAGNOSIS — Z125 Encounter for screening for malignant neoplasm of prostate: Secondary | ICD-10-CM

## 2013-12-06 DIAGNOSIS — I10 Essential (primary) hypertension: Secondary | ICD-10-CM

## 2013-12-06 LAB — COMPREHENSIVE METABOLIC PANEL
ALBUMIN: 3.7 g/dL (ref 3.5–5.2)
ALT: 19 U/L (ref 0–53)
AST: 22 U/L (ref 0–37)
Alkaline Phosphatase: 55 U/L (ref 39–117)
BUN: 22 mg/dL (ref 6–23)
CALCIUM: 9 mg/dL (ref 8.4–10.5)
CHLORIDE: 106 meq/L (ref 96–112)
CO2: 28 meq/L (ref 19–32)
CREATININE: 0.9 mg/dL (ref 0.4–1.5)
GFR: 84.78 mL/min (ref >60.00)
Glucose, Bld: 106 mg/dL — ABNORMAL HIGH (ref 70–99)
POTASSIUM: 4.7 meq/L (ref 3.5–5.1)
Sodium: 141 mEq/L (ref 135–145)
Total Bilirubin: 0.7 mg/dL (ref 0.2–1.2)
Total Protein: 5.9 g/dL — ABNORMAL LOW (ref 6.0–8.3)

## 2013-12-06 LAB — CBC WITH DIFFERENTIAL/PLATELET
BASOS PCT: 0.4 % (ref 0.0–3.0)
Basophils Absolute: 0 10*3/uL (ref 0.0–0.1)
Eosinophils Absolute: 0.2 10*3/uL (ref 0.0–0.7)
Eosinophils Relative: 3.6 % (ref 0.0–5.0)
HCT: 45.6 % (ref 39.0–52.0)
HEMOGLOBIN: 15.6 g/dL (ref 13.0–17.0)
LYMPHS PCT: 17 % (ref 12.0–46.0)
Lymphs Abs: 0.9 10*3/uL (ref 0.7–4.0)
MCHC: 34.2 g/dL (ref 30.0–36.0)
MCV: 88.3 fl (ref 78.0–100.0)
MONOS PCT: 8.2 % (ref 3.0–12.0)
Monocytes Absolute: 0.4 10*3/uL (ref 0.1–1.0)
NEUTROS ABS: 3.6 10*3/uL (ref 1.4–7.7)
NEUTROS PCT: 70.8 % (ref 43.0–77.0)
Platelets: 167 10*3/uL (ref 150.0–400.0)
RBC: 5.16 Mil/uL (ref 4.22–5.81)
RDW: 12.5 % (ref 11.5–15.5)
WBC: 5.1 10*3/uL (ref 4.0–10.5)

## 2013-12-06 LAB — LIPID PANEL
CHOL/HDL RATIO: 3
CHOLESTEROL: 179 mg/dL (ref 0–200)
HDL: 55.5 mg/dL (ref >39.00)
LDL CALC: 117 mg/dL — AB (ref 0–99)
NonHDL: 123.5
TRIGLYCERIDES: 32 mg/dL (ref 0.0–149.0)
VLDL: 6.4 mg/dL (ref 0.0–40.0)

## 2013-12-06 LAB — TSH: TSH: 1.17 u[IU]/mL (ref 0.35–4.50)

## 2013-12-06 LAB — HEMOGLOBIN A1C: Hgb A1c MFr Bld: 5.8 % (ref 4.6–6.5)

## 2013-12-06 LAB — PSA: PSA: 1.18 ng/mL (ref 0.10–4.00)

## 2013-12-10 ENCOUNTER — Encounter: Payer: Self-pay | Admitting: Gastroenterology

## 2013-12-10 ENCOUNTER — Ambulatory Visit (INDEPENDENT_AMBULATORY_CARE_PROVIDER_SITE_OTHER): Payer: BC Managed Care – PPO | Admitting: Family Medicine

## 2013-12-10 ENCOUNTER — Encounter: Payer: Self-pay | Admitting: Family Medicine

## 2013-12-10 VITALS — BP 126/74 | HR 57 | Temp 98.3°F | Ht 74.0 in | Wt 235.8 lb

## 2013-12-10 DIAGNOSIS — I10 Essential (primary) hypertension: Secondary | ICD-10-CM

## 2013-12-10 DIAGNOSIS — Z125 Encounter for screening for malignant neoplasm of prostate: Secondary | ICD-10-CM

## 2013-12-10 DIAGNOSIS — R7309 Other abnormal glucose: Secondary | ICD-10-CM

## 2013-12-10 DIAGNOSIS — Z0000 Encounter for general adult medical examination without abnormal findings: Secondary | ICD-10-CM

## 2013-12-10 DIAGNOSIS — Z800 Family history of malignant neoplasm of digestive organs: Secondary | ICD-10-CM | POA: Insufficient documentation

## 2013-12-10 DIAGNOSIS — E782 Mixed hyperlipidemia: Secondary | ICD-10-CM

## 2013-12-10 NOTE — Progress Notes (Signed)
Pre visit review using our clinic review tool, if applicable. No additional management support is needed unless otherwise documented below in the visit note. 

## 2013-12-10 NOTE — Assessment & Plan Note (Signed)
Lab Results  Component Value Date   HGBA1C 5.8 12/06/2013    Doing well - disc low glycemic diet and weight control

## 2013-12-10 NOTE — Assessment & Plan Note (Signed)
Ref for 5 year recall colonosc  No clinical changes

## 2013-12-10 NOTE — Assessment & Plan Note (Signed)
Controlled with lifestyle and stress reduction Urged good diet and exercise  BP: 126/74 mmHg

## 2013-12-10 NOTE — Progress Notes (Signed)
Subjective:    Patient ID: Troy [name], male    DOB: Jan 22, 1946, 68 y.o.   MRN: [id number]  HPI Here for health maintenance exam and to review chronic medical problems    Doing well overall  Nothing new going on   He is still working   [employer] is down 5 lb with bmi of 30 - he has been more active and wife cooks healthier  Traveling a lot lately  Job is physical - that keeps him active-nothing additional (lot of lifting ) - he enjoys his job  Not stressed   Falls -none  Mood- very good / motivated and not depressed   Colonosc 4/10- due for 5 year recall due to fam hx (father) Will work on setting that up   Zoster status- ? If had shingles vaccine at the [place] - he will find out for [place]   Pneumovax - thinks he had one at the [place]  Td 7/04 Last flu shot - got it in the fall   Hx of HTN treated with lifestyle change and stress reduction  BP Readings from Last 3 Encounters:  12/10/13 126/74  10/18/13 136/78  04/30/10 140/90   improved   Hyperglycemia Lab Results  Component Value Date   HGBA1C 5.8 12/06/2013   down from 6  Prostate ca screen  Lab Results  Component Value Date   PSA 1.18 12/06/2013   PSA 1.91 07/19/2009   PSA 1.01 07/26/2008   no prostate problems  Nocturia times 2 -normal for him and no change   Cholesterol Lab Results  Component Value Date   CHOL 179 12/06/2013   CHOL 191 02/19/2010   CHOL 207* 07/19/2009   Lab Results  Component Value Date   HDL 55.50 12/06/2013   HDL 09.98 02/19/2010   HDL 69.70 07/19/2009   Lab Results  Component Value Date   LDLCALC 117* 12/06/2013   LDLCALC 125* 02/19/2010   LDLCALC 124* 01/17/2009   Lab Results  Component Value Date   TRIG 32.0 12/06/2013   TRIG 47.0 02/19/2010   TRIG 45.0 07/19/2009   Lab Results  Component Value Date   CHOLHDL 3 12/06/2013   CHOLHDL 3 02/19/2010   CHOLHDL 3 07/19/2009   Lab Results  Component Value Date   LDLDIRECT 136.9 07/19/2009   it is improved - he is eating healthier -wife is prepping  food better now    Patient Active Problem List   Diagnosis Date Noted  . Routine general medical examination at a health care facility 12/10/2013  . Family history of colon cancer 12/10/2013  . Prostate cancer screening 12/05/2013  . Allergic rhinitis 10/18/2013  . ROTATOR CUFF INJURY, LEFT SHOULDER 04/30/2010  . HYPERGLYCEMIA 08/21/2009  . HYPERLIPIDEMIA 04/20/2007  . ESSENTIAL HYPERTENSION 04/20/2007  . OSTEOARTHRITIS 02/09/2007   No past medical history on file. No past surgical history on file. History  Substance Use Topics  . Smoking status: Never Smoker   . Smokeless tobacco: Never Used  . Alcohol Use: No     Comment: quit over 39 years ago   No family history on file. Allergies  Allergen Reactions  . Chlorzoxazone     REACTION: dizzy   No current outpatient prescriptions on file prior to visit.   No current facility-administered medications on file prior to visit.    Review of Systems Review of Systems  Constitutional: Negative for fever, appetite change, fatigue and unexpected weight change.  Eyes: Negative for pain and visual disturbance.  Respiratory:  Negative for cough and shortness of breath.   Cardiovascular: Negative for cp or palpitations    Gastrointestinal: Negative for nausea, diarrhea and constipation.  Genitourinary: Negative for urgency and frequency. pos for stable nocturia / neg for flow problems or incontinence  Skin: Negative for pallor or rash   Neurological: Negative for weakness, light-headedness, numbness and headaches.  Hematological: Negative for adenopathy. Does not bruise/bleed easily.  Psychiatric/Behavioral: Negative for dysphoric mood. The patient is not nervous/anxious.         Objective:   Physical Exam  Constitutional: He appears well-developed and well-nourished. No distress.  overwt and well app  HENT:  Head: Normocephalic and atraumatic.  Right Ear: External ear normal.  Left Ear: External ear normal.  Nose: Nose  normal.  Mouth/Throat: Oropharynx is clear and moist.  Eyes: Conjunctivae and EOM are normal. Pupils are equal, round, and reactive to light. Right eye exhibits no discharge. Left eye exhibits no discharge. No scleral icterus.  Neck: Normal range of motion. Neck supple. No JVD present. Carotid bruit is not present. No thyromegaly present.  Cardiovascular: Normal rate, regular rhythm, normal heart sounds and intact distal pulses.  Exam reveals no gallop.   Pulmonary/Chest: Effort normal and breath sounds normal. No respiratory distress. He has no wheezes. He exhibits no tenderness.  Abdominal: Soft. Bowel sounds are normal. He exhibits no distension, no abdominal bruit and no mass. There is no tenderness.  Musculoskeletal: He exhibits no edema and no tenderness.  Lymphadenopathy:    He has no cervical adenopathy.  Neurological: He is alert. He has normal reflexes. No cranial nerve deficit. He exhibits normal muscle tone. Coordination normal.  Skin: Skin is warm and dry. No rash noted. No erythema. No pallor.  Psychiatric: He has a normal mood and affect.          Assessment & Plan:   Problem List Items Addressed This Visit     Cardiovascular and Mediastinum   ESSENTIAL HYPERTENSION - Primary     Controlled with lifestyle and stress reduction Urged good diet and exercise  BP: 126/74 mmHg        Other   HYPERLIPIDEMIA     Disc goals for lipids and reasons to control them Rev labs with pt Rev low sat fat diet in detail  Overall improved with better eating    HYPERGLYCEMIA      Lab Results  Component Value Date   HGBA1C 5.8 12/06/2013    Doing well - disc low glycemic diet and weight control     Prostate cancer screening      Lab Results  Component Value Date   PSA 1.18 12/06/2013   PSA 1.91 07/19/2009   PSA 1.01 07/26/2008    No clinical changes DRE not done    Routine general medical examination at a health care facility     Reviewed health habits including diet and  exercise and skin cancer prevention Reviewed appropriate screening tests for age  Also reviewed health mt list, fam hx and immunization status , as well as social and family history   See HPI Labs reviewed  [facility] (he is fairly sure he did)    Family history of colon cancer     Ref for 5 year recall colonosc  No clinical changes     Relevant Orders      Ambulatory referral to Gastroenterology

## 2013-12-10 NOTE — Assessment & Plan Note (Signed)
Lab Results  Component Value Date   PSA 1.18 12/06/2013   PSA 1.91 07/19/2009   PSA 1.01 07/26/2008    No clinical changes DRE not done

## 2013-12-10 NOTE — Assessment & Plan Note (Signed)
Reviewed health habits including diet and exercise and skin cancer prevention Reviewed appropriate screening tests for age  Also reviewed health mt list, fam hx and immunization status , as well as social and family history   See HPI Labs reviewed  He will call the [place] to see if he had pneumovax and zoster vaccines (he is fairly sure he did)

## 2013-12-10 NOTE — Patient Instructions (Signed)
I'm glad you are doing well  Start regular exercise  [facility] to see if you have had a shingles vaccine and a pneumonia vaccine and let me know the dates  Stop at check out for colonoscopy referral

## 2013-12-10 NOTE — Assessment & Plan Note (Signed)
Disc goals for lipids and reasons to control them Rev labs with pt Rev low sat fat diet in detail  Overall improved with better eating

## 2013-12-13 ENCOUNTER — Telehealth: Payer: Self-pay | Admitting: Family Medicine

## 2014-02-09 ENCOUNTER — Ambulatory Visit (AMBULATORY_SURGERY_CENTER): Payer: Self-pay | Admitting: *Deleted

## 2014-02-09 ENCOUNTER — Telehealth: Payer: Self-pay | Admitting: *Deleted

## 2014-02-09 VITALS — Ht 75.0 in | Wt 238.0 lb

## 2014-02-09 DIAGNOSIS — Z800 Family history of malignant neoplasm of digestive organs: Secondary | ICD-10-CM

## 2014-02-09 MED ORDER — NA SULFATE-K SULFATE-MG SULF 17.5-3.13-1.6 GM/177ML PO SOLN: ORAL | Status: DC

## 2014-02-09 NOTE — Telephone Encounter (Signed)
Patient is for recall colonoscopy on 02-18-14. Patient states last surgery was 2012 rotator cuff repair with Dr.Chandler at [facility], patient states he was told he "scared them" while he was waking up, so his wife called them yesterday, she was told he went "stiff" after surgery.  I was unsure what happened so I called Dr.Chandler's office [phone]) and left a message. Nurse will call pre-visit direct # tomorrow with this information. Thanks!

## 2014-02-09 NOTE — Progress Notes (Signed)
Patient denies any allergies to eggs or soy.  Patient denies any oxygen use at home and does not take any diet/weight loss medications. EMMI education assisgned to patient on colonoscopy, this was explained and instructions given to patient. Patient states after left shoulder rotator cuff repair in 2012 he was told "you scared [place]" and wife called orthopedic doctor(Dr.Chandler) yesterday she was told he was "stiff". Patient denies any anesthesia problems in the past with his previous back surgery or colonoscopies.  Called Dr.Chandler's office and left message. Phone note sent to Langley Adie pre-visit nurse to follow up.

## 2014-02-10 NOTE — Telephone Encounter (Signed)
Received call from Jiles Harold, PA for Dr [facility] Filter.  She does not have anesthesia records for 2012 surgery; no note on operative report that shows problems with anesthesia .  She said that [facility] is where pt had surgery.  Called [facility] at [phone].  They will call when records are available.

## 2014-02-10 NOTE — Telephone Encounter (Signed)
Troy [name]: Received Anesthesia Record from 2012 surgery.  It  is in [facility] Room 50 for you to review. Pt is scheduled for colonoscopy 10/9

## 2014-02-11 NOTE — Telephone Encounter (Signed)
Troy [name],  I have reviewed the pt's last operative record and he is cleared for care at [facility]  Thanks for your efforts in this matter.  Best,  Cathlyn Parsons

## 2014-02-11 NOTE — Telephone Encounter (Signed)
Tried pt to tell him he is fine to have colon in [facility], states VM not set up and could not LM. Will try again Monday. Bufford Spikes rn

## 2014-02-14 NOTE — Telephone Encounter (Signed)
1210 pm attempted to call pt back x2 and still could not leave a message. Will attempt at a later time.  Bufford Spikes RN

## 2014-02-14 NOTE — Telephone Encounter (Signed)
Informed pt that per Cathlyn Parsons CRNA he is fine to proceed with his colon as planned and scheduled 10-9. Friday. Pt returned verbal understanding of such.   Bufford Spikes RN

## 2014-02-16 ENCOUNTER — Telehealth: Payer: Self-pay | Admitting: Gastroenterology

## 2014-02-16 ENCOUNTER — Encounter: Payer: Self-pay | Admitting: Family Medicine

## 2014-02-16 ENCOUNTER — Ambulatory Visit (INDEPENDENT_AMBULATORY_CARE_PROVIDER_SITE_OTHER): Payer: BC Managed Care – PPO | Admitting: Family Medicine

## 2014-02-16 VITALS — BP 120/80 | HR 61 | Temp 98.3°F | Ht 74.0 in | Wt 238.5 lb

## 2014-02-16 DIAGNOSIS — H8113 Benign paroxysmal vertigo, bilateral: Secondary | ICD-10-CM

## 2014-02-16 DIAGNOSIS — H8110 Benign paroxysmal vertigo, unspecified ear: Secondary | ICD-10-CM | POA: Insufficient documentation

## 2014-02-16 MED ORDER — MECLIZINE HCL 25 MG PO TABS: 25.0000 mg | ORAL_TABLET | Freq: Three times a day (TID) | ORAL | Status: DC | PRN

## 2014-02-16 NOTE — Assessment & Plan Note (Signed)
Suspect worsened by allergies/ ETD Will start flonase for at least 2 weeks (this has helped in the past)  Adv to change position slowly to dec fall risk  Meclizine px printed -to fill only if he needs it  Reassuring exam today Update if not starting to improve in a week or if worsening

## 2014-02-16 NOTE — Patient Instructions (Addendum)
I think you may have an inner ear problem causing vertigo  Make an effort to change position slowly  Start flonase daily for at least 2 week (2 sprays in each nostril once daily)  If your dizziness worsens-fill the px for meclizine and take it with caution (it may sedate you)  Update if not starting to improve in a week or if worsening   Also do not forget to get an eye exam

## 2014-02-16 NOTE — Progress Notes (Signed)
Subjective:    Patient ID: Troy [name], male    DOB: 11-03-1945, 68 y.o.   MRN: [id number]  HPI Here for lightheadedness 3-4 days   It is allergy season-but symptoms are not too bad occ blows nose  Some pressure in the top of his head at times   He notices dizziness getting out of shower/ getting into truck  Worse with change in position  A little hard to focus at times  Feels generally ok    Does not have a feeling of spinning   Episodes last less than a minute   No allergy medicines   BP Readings from Last 3 Encounters:  02/16/14 120/80  12/10/13 126/74  10/18/13 136/78     Had colonosc planned fri- cancelled it for now   Patient Active Problem List   Diagnosis Date Noted  . Benign paroxysmal positional vertigo 02/16/2014  . Routine general medical examination at a health care facility 12/10/2013  . Family history of colon cancer 12/10/2013  . Prostate cancer screening 12/05/2013  . Allergic rhinitis 10/18/2013  . ROTATOR CUFF INJURY, LEFT SHOULDER 04/30/2010  . HYPERGLYCEMIA 08/21/2009  . HYPERLIPIDEMIA 04/20/2007  . ESSENTIAL HYPERTENSION 04/20/2007  . OSTEOARTHRITIS 02/09/2007   No past medical history on file. Past Surgical History  Procedure Laterality Date  . Back surgery  04/1998  . Rotator cuff repair Left 2012  . Tonsillectomy and adenoidectomy  age 46  . Lipoma excision  09/2009   History  Substance Use Topics  . Smoking status: Never Smoker   . Smokeless tobacco: Never Used  . Alcohol Use: No     Comment: quit over 39 years ago   Family History  Problem Relation Age of Onset  . Dementia Mother   . Colon cancer Father 10   Allergies  Allergen Reactions  . Chlorzoxazone     REACTION: dizzy   Current Outpatient Prescriptions on File Prior to Visit  Medication Sig Dispense Refill  . Multiple Vitamin (MULTIVITAMIN) tablet Take 1 tablet by mouth daily.       No current facility-administered medications on file prior to visit.      Review of Systems Review of Systems  Constitutional: Negative for fever, appetite change, fatigue and unexpected weight change.  Eyes: Negative for pain and visual disturbance.  ENT pos for occ congestion and ear fullness  Respiratory: Negative for cough and shortness of breath.   Cardiovascular: Negative for cp or palpitations    Gastrointestinal: Negative for nausea, diarrhea and constipation.  Genitourinary: Negative for urgency and frequency.  Skin: Negative for pallor or rash   Neurological: Negative for weakness, numbness and headaches. neg for facial droop or speech or swallowing problems  Hematological: Negative for adenopathy. Does not bruise/bleed easily.  Psychiatric/Behavioral: Negative for dysphoric mood. The patient is not nervous/anxious.         Objective:   Physical Exam  Constitutional: He appears well-developed and well-nourished. No distress.  obese and well appearing   HENT:  Head: Normocephalic and atraumatic.  Right Ear: External ear normal.  Left Ear: External ear normal.  Mouth/Throat: Oropharynx is clear and moist.  Nares are boggy No sinus tenderness   Throat clear   Eyes: Conjunctivae and EOM are normal. Pupils are equal, round, and reactive to light. Right eye exhibits no discharge. Left eye exhibits no discharge. No scleral icterus.  2-3 beats of horizontal nystagmus bilat   Neck: Normal range of motion. Neck supple. No JVD present. Carotid bruit is  not present. No thyromegaly present.  Cardiovascular: Normal rate, regular rhythm and normal heart sounds.   Pulmonary/Chest: Effort normal and breath sounds normal. No respiratory distress. He has no wheezes. He has no rales.  Musculoskeletal: He exhibits no edema.  Lymphadenopathy:    He has no cervical adenopathy.  Neurological: He is alert. He has normal reflexes. He displays no atrophy and no tremor. No cranial nerve deficit or sensory deficit. He exhibits normal muscle tone. He displays a negative  Romberg sign. Coordination and gait normal.  No ataxia No facial droop No palmar drift   Is able to walk heel to toe  Skin: Skin is warm and dry. No rash noted. No erythema. No pallor.  Psychiatric: He has a normal mood and affect.          Assessment & Plan:   Problem List Items Addressed This Visit     Nervous and Auditory   Benign paroxysmal positional vertigo - Primary     Suspect worsened by allergies/ ETD Will start flonase for at least 2 weeks (this has helped in the past)  Adv to change position slowly to dec fall risk  Meclizine px printed -to fill only if he needs it  Reassuring exam today Update if not starting to improve in a week or if worsening

## 2014-02-16 NOTE — Progress Notes (Signed)
Pre visit review using our clinic review tool, if applicable. No additional management support is needed unless otherwise documented below in the visit note. 

## 2014-02-16 NOTE — Telephone Encounter (Signed)
No

## 2014-02-18 ENCOUNTER — Encounter: Payer: BC Managed Care – PPO | Admitting: Gastroenterology

## 2014-03-01 ENCOUNTER — Encounter: Payer: Self-pay | Admitting: Gastroenterology

## 2015-06-09 ENCOUNTER — Ambulatory Visit (INDEPENDENT_AMBULATORY_CARE_PROVIDER_SITE_OTHER): Payer: BLUE CROSS/BLUE SHIELD | Admitting: Family Medicine

## 2015-06-09 ENCOUNTER — Encounter: Payer: Self-pay | Admitting: Family Medicine

## 2015-06-09 VITALS — BP 126/82 | HR 76 | Temp 98.2°F | Wt 250.0 lb

## 2015-06-09 DIAGNOSIS — E782 Mixed hyperlipidemia: Secondary | ICD-10-CM | POA: Diagnosis not present

## 2015-06-09 DIAGNOSIS — F419 Anxiety disorder, unspecified: Secondary | ICD-10-CM | POA: Insufficient documentation

## 2015-06-09 DIAGNOSIS — R7309 Other abnormal glucose: Secondary | ICD-10-CM | POA: Diagnosis not present

## 2015-06-09 DIAGNOSIS — I10 Essential (primary) hypertension: Secondary | ICD-10-CM | POA: Diagnosis not present

## 2015-06-09 DIAGNOSIS — F411 Generalized anxiety disorder: Secondary | ICD-10-CM

## 2015-06-09 LAB — COMPREHENSIVE METABOLIC PANEL
ALT: 18 U/L (ref 0–53)
AST: 23 U/L (ref 0–37)
Albumin: 4.3 g/dL (ref 3.5–5.2)
Alkaline Phosphatase: 61 U/L (ref 39–117)
BUN: 16 mg/dL (ref 6–23)
CO2: 30 meq/L (ref 19–32)
Calcium: 9.2 mg/dL (ref 8.4–10.5)
Chloride: 104 mEq/L (ref 96–112)
Creatinine, Ser: 0.97 mg/dL (ref 0.40–1.50)
GFR: 81.4 mL/min (ref >60.00)
GLUCOSE: 143 mg/dL — AB (ref 70–99)
POTASSIUM: 3.8 meq/L (ref 3.5–5.1)
SODIUM: 141 meq/L (ref 135–145)
Total Bilirubin: 0.7 mg/dL (ref 0.2–1.2)
Total Protein: 6.3 g/dL (ref 6.0–8.3)

## 2015-06-09 LAB — CBC WITH DIFFERENTIAL/PLATELET
BASOS ABS: 0 10*3/uL (ref 0.0–0.1)
Basophils Relative: 0.4 % (ref 0.0–3.0)
EOS PCT: 4.1 % (ref 0.0–5.0)
Eosinophils Absolute: 0.3 10*3/uL (ref 0.0–0.7)
HCT: 48.9 % (ref 39.0–52.0)
Hemoglobin: 16.5 g/dL (ref 13.0–17.0)
LYMPHS ABS: 1.2 10*3/uL (ref 0.7–4.0)
Lymphocytes Relative: 17.8 % (ref 12.0–46.0)
MCHC: 33.8 g/dL (ref 30.0–36.0)
MCV: 88.2 fl (ref 78.0–100.0)
MONO ABS: 0.3 10*3/uL (ref 0.1–1.0)
MONOS PCT: 5.1 % (ref 3.0–12.0)
NEUTROS ABS: 4.9 10*3/uL (ref 1.4–7.7)
NEUTROS PCT: 72.6 % (ref 43.0–77.0)
PLATELETS: 211 10*3/uL (ref 150.0–400.0)
RBC: 5.55 Mil/uL (ref 4.22–5.81)
RDW: 12.5 % (ref 11.5–15.5)
WBC: 6.8 10*3/uL (ref 4.0–10.5)

## 2015-06-09 LAB — LIPID PANEL
CHOL/HDL RATIO: 3
Cholesterol: 209 mg/dL — ABNORMAL HIGH (ref 0–200)
HDL: 62.7 mg/dL (ref >39.00)
LDL CALC: 122 mg/dL — AB (ref 0–99)
NONHDL: 146.45
TRIGLYCERIDES: 124 mg/dL (ref 0.0–149.0)
VLDL: 24.8 mg/dL (ref 0.0–40.0)

## 2015-06-09 LAB — TSH: TSH: 1.21 u[IU]/mL (ref 0.35–4.50)

## 2015-06-09 LAB — HEMOGLOBIN A1C: Hgb A1c MFr Bld: 5.8 % (ref 4.6–6.5)

## 2015-06-09 NOTE — Patient Instructions (Addendum)
Flu shot today  Please find out from the [place] if you had a shingles shot and let me know the date , (also pneumonia shots - both the prevnar and the pneumovax 23)   Labs today If you feel you need medication for anxiety - let me know (I do not think you do now) Keep busy -think about projects  Even though you are active - do add an exercise program (I think it would help mood and weight) Try to eat healthier and stop junk food    For nasal congestion- get Flonase nasal spray over the counter - use daily as directed  I think this will help a lot

## 2015-06-09 NOTE — Progress Notes (Signed)
Pre visit review using our clinic review tool, if applicable. No additional management support is needed unless otherwise documented below in the visit note. 

## 2015-06-09 NOTE — Progress Notes (Signed)
Subjective:    Patient ID: Troy [name], male    DOB: 1946-03-29, 70 y.o.   MRN: [id number]  HPI  Here for anxiety  He thinks he has had trouble with it all his life but getting worse   ? If panic attacks-has moments when he feels anxious and short of breath This happens when he sits still / not doing anything  Is a busy body  Really loves work - works 40 hours per day - never feels anxious when he works   [employer] is up 12 lb with bmi of 32   When he is at home - watches TV  Not eating a healthy diet - too much junk (wife tries to hide it)- snacks at night when he is fidgity  Exercise - he walks every day - 10,000 steps on his fit bit every day  30,000 steps a few times this week! Enjoys being active  Is generally very active  No particular worries General worries about family but nothing specific   Nose feels stuffy quite a bit -nasal  bp is stable today  No cp or palpitations or headaches or edema  No side effects to medicines  BP Readings from Last 3 Encounters:  06/09/15 126/82  02/16/14 120/80  12/10/13 126/74     No allergy medicines  He sleeps pretty well  6-8 hours per night -feels that is enough  Due for labs  Hx of hyperlipidemia Diet is fair for fats right now  No cp or heart problems/vascular dx    Due for A1C for hyperglycemia Is not watching diet very carefully for sugar     Patient Active Problem List   Diagnosis Date Noted  . Anxiety state 06/09/2015  . Benign paroxysmal positional vertigo 02/16/2014  . Routine general medical examination at a health care facility 12/10/2013  . Family history of colon cancer 12/10/2013  . Prostate cancer screening 12/05/2013  . Allergic rhinitis 10/18/2013  . ROTATOR CUFF INJURY, LEFT SHOULDER 04/30/2010  . HYPERGLYCEMIA 08/21/2009  . HYPERLIPIDEMIA 04/20/2007  . Essential hypertension 04/20/2007  . OSTEOARTHRITIS 02/09/2007   No past medical history on file. Past Surgical History  Procedure  Laterality Date  . Back surgery  04/1998  . Rotator cuff repair Left 2012  . Tonsillectomy and adenoidectomy  age 5  . Lipoma excision  09/2009   Social History  Substance Use Topics  . Smoking status: Never Smoker   . Smokeless tobacco: Never Used  . Alcohol Use: No     Comment: quit over 39 years ago   Family History  Problem Relation Age of Onset  . Dementia Mother   . Colon cancer Father 68   Allergies  Allergen Reactions  . Chlorzoxazone     REACTION: dizzy   Current Outpatient Prescriptions on File Prior to Visit  Medication Sig Dispense Refill  . Multiple Vitamin (MULTIVITAMIN) tablet Take 1 tablet by mouth daily.     No current facility-administered medications on file prior to visit.    Review of Systems Review of Systems  Constitutional: Negative for fever, appetite change, fatigue and unexpected weight change.  Eyes: Negative for pain and visual disturbance.  Respiratory: Negative for cough and shortness of breath.   Cardiovascular: Negative for cp or palpitations    Gastrointestinal: Negative for nausea, diarrhea and constipation.  Genitourinary: Negative for urgency and frequency.  Skin: Negative for pallor or rash   Neurological: Negative for weakness, light-headedness, numbness and headaches.  Hematological: Negative for  adenopathy. Does not bruise/bleed easily.  Psychiatric/Behavioral: Negative for dysphoric mood. The patient is anxious when not busy Neg for inattentiveness, pos for restlessness        Objective:   Physical Exam  Constitutional: He is oriented to person, place, and time. He appears well-developed and well-nourished. No distress.  obese and well appearing   HENT:  Head: Normocephalic and atraumatic.  Right Ear: External ear normal.  Left Ear: External ear normal.  Nose: Nose normal.  Mouth/Throat: Oropharynx is clear and moist.  Eyes: Conjunctivae and EOM are normal. Pupils are equal, round, and reactive to light. Right eye  exhibits no discharge. Left eye exhibits no discharge. No scleral icterus.  Neck: Normal range of motion. Neck supple. No JVD present. Carotid bruit is not present. No thyromegaly present.  Cardiovascular: Normal rate, regular rhythm, normal heart sounds and intact distal pulses.  Exam reveals no gallop.   Pulmonary/Chest: Effort normal and breath sounds normal. No respiratory distress. He has no wheezes. He has no rales.  Abdominal: Soft. Bowel sounds are normal. He exhibits no distension, no abdominal bruit and no mass. There is no tenderness.  Musculoskeletal: Normal range of motion. He exhibits no edema or tenderness.  Lymphadenopathy:    He has no cervical adenopathy.  Neurological: He is alert and oriented to person, place, and time. He has normal reflexes. No cranial nerve deficit. He exhibits normal muscle tone. Coordination normal.  No tremor   Skin: Skin is warm and dry. No rash noted. No erythema. No pallor.  Psychiatric: He has a normal mood and affect. His speech is normal and behavior is normal. Thought content normal. His affect is not blunt, not labile and not inappropriate. Thought content is not paranoid. He expresses no homicidal and no suicidal ideation.          Assessment & Plan:   Problem List Items Addressed This Visit      Cardiovascular and Mediastinum   Essential hypertension - Primary    bp in fair control at this time  BP Readings from Last 1 Encounters:  06/09/15 126/82   No changes needed Disc lifstyle change with low sodium diet and exercise  Labs today Wt loss encouraged       Relevant Orders   CBC with Differential/Platelet (Completed)   Comprehensive metabolic panel (Completed)   TSH (Completed)     Other   Anxiety state    Pt has anxiety/ restlessness when not actively busy  Will continue regular work schedule Enc him to add an exercise program as well  Disc hobbies/ things he enjoys to keep busy (? Perhaps boredom) Pt does not think  stressors are significant at this time - declines counseling  Does not think he needs medication Will continue to monitor       HYPERGLYCEMIA    Due for A1C Disc need for low glycemic diet and wt loss to prevent DM emph need for regular small meals with protein to prevent hypoglycemia        Relevant Orders   Hemoglobin A1c (Completed)   HYPERLIPIDEMIA    Lipid panel today Diet controlled Disc goals for lipids and reasons to control them Rev labs with pt (last check) Rev low sat fat diet in detail        Relevant Orders   Lipid panel (Completed)

## 2015-06-11 NOTE — Assessment & Plan Note (Signed)
Lipid panel today Diet controlled Disc goals for lipids and reasons to control them Rev labs with pt (last check) Rev low sat fat diet in detail

## 2015-06-11 NOTE — Assessment & Plan Note (Signed)
Pt has anxiety/ restlessness when not actively busy  Will continue regular work schedule Enc him to add an exercise program as well  Disc hobbies/ things he enjoys to keep busy (? Perhaps boredom) Pt does not think stressors are significant at this time - declines counseling  Does not think he needs medication Will continue to monitor

## 2015-06-11 NOTE — Assessment & Plan Note (Signed)
Due for A1C Disc need for low glycemic diet and wt loss to prevent DM emph need for regular small meals with protein to prevent hypoglycemia

## 2015-06-11 NOTE — Assessment & Plan Note (Signed)
bp in fair control at this time  BP Readings from Last 1 Encounters:  06/09/15 126/82   No changes needed Disc lifstyle change with low sodium diet and exercise  Labs today Wt loss encouraged

## 2015-06-12 ENCOUNTER — Encounter: Payer: Self-pay | Admitting: *Deleted

## 2016-10-16 ENCOUNTER — Encounter: Payer: Self-pay | Admitting: Family Medicine

## 2016-10-16 ENCOUNTER — Ambulatory Visit (INDEPENDENT_AMBULATORY_CARE_PROVIDER_SITE_OTHER): Payer: BLUE CROSS/BLUE SHIELD | Admitting: Family Medicine

## 2016-10-16 VITALS — BP 130/80 | HR 61 | Temp 97.5°F | Wt 243.2 lb

## 2016-10-16 DIAGNOSIS — I10 Essential (primary) hypertension: Secondary | ICD-10-CM | POA: Diagnosis not present

## 2016-10-16 NOTE — Patient Instructions (Addendum)
If you want to get a home cuff -OMRON for arm size regular   Blood pressure is ok today -reassuring   Only check BP when relaxed  Drink more water and avoid caffeine   Look at the DASH eating plan-try to avoid processed foods as much as you can   Exercise at least 30 minutes a day   We will schedule a health maintenance exam with labs prior

## 2016-10-16 NOTE — Progress Notes (Signed)
Subjective:    Patient ID: Troy [name], male    DOB: 03-29-1946, 71 y.o.   MRN: [id number]  HPI Here for elevated bp   Wt Readings from Last 3 Encounters:  10/16/16 243 lb 4 oz (110.3 kg)  06/09/15 250 lb (113.4 kg)  02/16/14 238 lb 8 oz (108.2 kg)  has lost weight since last visit  Very active job/walking a lot at work  Eating the same - eats so/so   (wife has eliminated junk food from house)  bmi 31.2  bp was elevated at employer wellness visit 164/97 electric cuff 164/100 manual  No symptoms He was fasting and felt shaky (wonders if he was a little hypoglycemic)   He re checked it at the pharmacy  150s systolic    BP Readings from Last 3 Encounters:  10/16/16 138/84  06/09/15 126/82  02/16/14 120/80  re check lowest 130/80 R arm and 138/80 L arm      Chemistry      Component Value Date/Time   NA 141 06/09/2015 1511   K 3.8 06/09/2015 1511   CL 104 06/09/2015 1511   CO2 30 06/09/2015 1511   BUN 16 06/09/2015 1511   CREATININE 0.97 06/09/2015 1511      Component Value Date/Time   CALCIUM 9.2 06/09/2015 1511   ALKPHOS 61 06/09/2015 1511   AST 23 06/09/2015 1511   ALT 18 06/09/2015 1511   BILITOT 0.7 06/09/2015 1511       At the health screen :  fingerstick Chol total 207 HDL 63 Triglycerides less than 45 LDL did not check  Chol/HDL ratio   Fasting glucose was 95    Patient Active Problem List   Diagnosis Date Noted  . Anxiety state 06/09/2015  . Benign paroxysmal positional vertigo 02/16/2014  . Routine general medical examination at a health care facility 12/10/2013  . Family history of colon cancer 12/10/2013  . Prostate cancer screening 12/05/2013  . Allergic rhinitis 10/18/2013  . ROTATOR CUFF INJURY, LEFT SHOULDER 04/30/2010  . HYPERGLYCEMIA 08/21/2009  . HYPERLIPIDEMIA 04/20/2007  . Essential hypertension 04/20/2007  . OSTEOARTHRITIS 02/09/2007   No past medical history on file. Past Surgical History:  Procedure Laterality Date    . BACK SURGERY  04/1998  . LIPOMA EXCISION  09/2009  . ROTATOR CUFF REPAIR Left 2012  . TONSILLECTOMY AND ADENOIDECTOMY  age 4   Social History  Substance Use Topics  . Smoking status: Never Smoker  . Smokeless tobacco: Never Used  . Alcohol use No     Comment: quit over 39 years ago   Family History  Problem Relation Age of Onset  . Dementia Mother   . Colon cancer Father 59   Allergies  Allergen Reactions  . Chlorzoxazone     REACTION: dizzy  . Sulfur Rash   Current Outpatient Prescriptions on File Prior to Visit  Medication Sig Dispense Refill  . Multiple Vitamin (MULTIVITAMIN) tablet Take 1 tablet by mouth daily.     No current facility-administered medications on file prior to visit.     Review of Systems  Constitutional: Negative for activity change, appetite change, fatigue, fever and unexpected weight change.  HENT: Negative for congestion, rhinorrhea, sore throat and trouble swallowing.   Eyes: Negative for pain, redness, itching and visual disturbance.  Respiratory: Negative for cough, chest tightness, shortness of breath and wheezing.   Cardiovascular: Negative for chest pain and palpitations.  Gastrointestinal: Negative for abdominal pain, blood in stool, constipation, diarrhea and  nausea.  Endocrine: Negative for cold intolerance, heat intolerance, polydipsia and polyuria.  Genitourinary: Negative for difficulty urinating, dysuria, frequency and urgency.  Musculoskeletal: Negative for arthralgias, joint swelling and myalgias.  Skin: Negative for pallor and rash.  Neurological: Negative for dizziness, tremors, weakness, numbness and headaches.  Hematological: Negative for adenopathy. Does not bruise/bleed easily.  Psychiatric/Behavioral: Negative for decreased concentration and dysphoric mood. The patient is not nervous/anxious.        Objective:   Physical Exam  Constitutional: He appears well-developed and well-nourished. No distress.  overwt and well  appearing   HENT:  Head: Normocephalic and atraumatic.  Mouth/Throat: Oropharynx is clear and moist.  Eyes: Conjunctivae and EOM are normal. Pupils are equal, round, and reactive to light.  Neck: Normal range of motion. Neck supple. No JVD present. Carotid bruit is not present. No thyromegaly present.  Cardiovascular: Normal rate, regular rhythm, normal heart sounds and intact distal pulses.  Exam reveals no gallop.   Pulmonary/Chest: Effort normal and breath sounds normal. No respiratory distress. He has no wheezes. He has no rales.  No crackles  Abdominal: Soft. Bowel sounds are normal. He exhibits no distension, no abdominal bruit and no mass. There is no tenderness.  Musculoskeletal: He exhibits no edema.  Lymphadenopathy:    He has no cervical adenopathy.  Neurological: He is alert. He has normal reflexes. No cranial nerve deficit. He exhibits normal muscle tone. Coordination normal.  Skin: Skin is warm and dry. No rash noted. No pallor.  Psychiatric: He has a normal mood and affect.          Assessment & Plan:   Problem List Items Addressed This Visit      Cardiovascular and Mediastinum   Essential hypertension - Primary    Pt has treated with lifestyle change  Recently elevated numbers outside the office (but while working and not at rest)  BP: 130/80  Here, reassuring  Given handout on way to take bp and also DASH diet  Plan f/u for health mt exam-will re check then  If he continues to have problems-will f/u earlier Low threshold to start tx

## 2016-10-17 NOTE — Assessment & Plan Note (Signed)
Pt has treated with lifestyle change  Recently elevated numbers outside the office (but while working and not at rest)  BP: 130/80  Here, reassuring  Given handout on way to take bp and also DASH diet  Plan f/u for health mt exam-will re check then  If he continues to have problems-will f/u earlier Low threshold to start tx

## 2017-01-01 ENCOUNTER — Telehealth: Payer: Self-pay | Admitting: Family Medicine

## 2017-01-01 ENCOUNTER — Ambulatory Visit (INDEPENDENT_AMBULATORY_CARE_PROVIDER_SITE_OTHER): Payer: BLUE CROSS/BLUE SHIELD

## 2017-01-01 VITALS — BP 142/90 | HR 63 | Temp 97.5°F | Ht 74.0 in | Wt 237.5 lb

## 2017-01-01 DIAGNOSIS — Z0000 Encounter for general adult medical examination without abnormal findings: Secondary | ICD-10-CM | POA: Diagnosis not present

## 2017-01-01 DIAGNOSIS — R7309 Other abnormal glucose: Secondary | ICD-10-CM

## 2017-01-01 DIAGNOSIS — E782 Mixed hyperlipidemia: Secondary | ICD-10-CM

## 2017-01-01 DIAGNOSIS — Z125 Encounter for screening for malignant neoplasm of prostate: Secondary | ICD-10-CM

## 2017-01-01 DIAGNOSIS — Z1159 Encounter for screening for other viral diseases: Secondary | ICD-10-CM

## 2017-01-01 DIAGNOSIS — I10 Essential (primary) hypertension: Secondary | ICD-10-CM

## 2017-01-01 DIAGNOSIS — Z23 Encounter for immunization: Secondary | ICD-10-CM | POA: Diagnosis not present

## 2017-01-01 LAB — LIPID PANEL
Cholesterol: 200 mg/dL (ref 0–200)
HDL: 57.5 mg/dL (ref >39.00)
LDL CALC: 132 mg/dL — AB (ref 0–99)
NONHDL: 142.98
Total CHOL/HDL Ratio: 3
Triglycerides: 56 mg/dL (ref 0.0–149.0)
VLDL: 11.2 mg/dL (ref 0.0–40.0)

## 2017-01-01 LAB — COMPREHENSIVE METABOLIC PANEL
ALT: 18 U/L (ref 0–53)
AST: 21 U/L (ref 0–37)
Albumin: 4.1 g/dL (ref 3.5–5.2)
Alkaline Phosphatase: 54 U/L (ref 39–117)
BILIRUBIN TOTAL: 0.6 mg/dL (ref 0.2–1.2)
BUN: 19 mg/dL (ref 6–23)
CO2: 34 meq/L — AB (ref 19–32)
CREATININE: 1.03 mg/dL (ref 0.40–1.50)
Calcium: 9.5 mg/dL (ref 8.4–10.5)
Chloride: 106 mEq/L (ref 96–112)
GFR: 75.61 mL/min (ref >60.00)
Glucose, Bld: 116 mg/dL — ABNORMAL HIGH (ref 70–99)
Potassium: 5.3 mEq/L — ABNORMAL HIGH (ref 3.5–5.1)
Sodium: 142 mEq/L (ref 135–145)
TOTAL PROTEIN: 6.2 g/dL (ref 6.0–8.3)

## 2017-01-01 LAB — CBC WITH DIFFERENTIAL/PLATELET
BASOS ABS: 0 10*3/uL (ref 0.0–0.1)
Basophils Relative: 0.7 % (ref 0.0–3.0)
EOS ABS: 0.2 10*3/uL (ref 0.0–0.7)
Eosinophils Relative: 3.8 % (ref 0.0–5.0)
HEMATOCRIT: 47.5 % (ref 39.0–52.0)
Hemoglobin: 15.9 g/dL (ref 13.0–17.0)
LYMPHS ABS: 0.9 10*3/uL (ref 0.7–4.0)
LYMPHS PCT: 21.2 % (ref 12.0–46.0)
MCHC: 33.4 g/dL (ref 30.0–36.0)
MCV: 90.3 fl (ref 78.0–100.0)
Monocytes Absolute: 0.3 10*3/uL (ref 0.1–1.0)
Monocytes Relative: 8.5 % (ref 3.0–12.0)
NEUTROS ABS: 2.7 10*3/uL (ref 1.4–7.7)
NEUTROS PCT: 65.8 % (ref 43.0–77.0)
PLATELETS: 173 10*3/uL (ref 150.0–400.0)
RBC: 5.26 Mil/uL (ref 4.22–5.81)
RDW: 13.2 % (ref 11.5–15.5)
WBC: 4 10*3/uL (ref 4.0–10.5)

## 2017-01-01 LAB — HEMOGLOBIN A1C: Hgb A1c MFr Bld: 5.8 % (ref 4.6–6.5)

## 2017-01-01 LAB — PSA: PSA: 1.65 ng/mL (ref 0.10–4.00)

## 2017-01-01 LAB — TSH: TSH: 1.91 u[IU]/mL (ref 0.35–4.50)

## 2017-01-01 NOTE — Patient Instructions (Signed)
Troy [name] , Thank you for taking time to come for your Medicare Wellness Visit. I appreciate your ongoing commitment to your health goals. Please review the following plan we discussed and let me know if I can assist you in the future.   These are the goals we discussed: Goals    . Increase physical activity          Starting 01/01/2017, I will continue to walk at 8,000 - 10,000 steps 5 days per week.        This is a list of the screening recommended for you and due dates:  Health Maintenance  Topic Date Due  . Flu Shot  08/10/2017*  . Pneumonia vaccines (2 of 2 - PPSV23) 01/01/2018  . Colon Cancer Screening  08/20/2018  . Tetanus Vaccine  01/25/2025  .  Hepatitis C: One time screening is recommended by Center for Disease Control  (CDC) for  adults born from [birth year] through 1965.   Completed  *Topic was postponed. The date shown is not the original due date.   Preventive Care for Adults  A healthy lifestyle and preventive care can promote health and wellness. Preventive health guidelines for adults include the following key practices.  . A routine yearly physical is a good way to check with your health care provider about your health and preventive screening. It is a chance to share any concerns and updates on your health and to receive a thorough exam.  . Visit your dentist for a routine exam and preventive care every 6 months. Brush your teeth twice a day and floss once a day. Good oral hygiene prevents tooth decay and gum disease.  . The frequency of eye exams is based on your age, health, family medical history, use  of contact lenses, and other factors. Follow your health care provider's ecommendations for frequency of eye exams.  . Eat a healthy diet. Foods like vegetables, fruits, whole grains, low-fat dairy products, and lean protein foods contain the nutrients you need without too many calories. Decrease your intake of foods high in solid fats, added sugars, and salt. Eat  the right amount of calories for you. Get information about a proper diet from your health care provider, if necessary.  . Regular physical exercise is one of the most important things you can do for your health. Most adults should get at least 150 minutes of moderate-intensity exercise (any activity that increases your heart rate and causes you to sweat) each week. In addition, most adults need muscle-strengthening exercises on 2 or more days a week.  Silver Sneakers may be a benefit available to you. To determine eligibility, you may visit the website: www.silversneakers.com or contact program at 367-561-7029 Mon-Fri between 8AM-8PM.   . Maintain a healthy weight. The body mass index (BMI) is a screening tool to identify possible weight problems. It provides an estimate of body fat based on height and weight. Your health care provider can find your BMI and can help you achieve or maintain a healthy weight.   For adults 20 years and older: ? A BMI below 18.5 is considered underweight. ? A BMI of 18.5 to 24.9 is normal. ? A BMI of 25 to 29.9 is considered overweight. ? A BMI of 30 and above is considered obese.   . Maintain normal blood lipids and cholesterol levels by exercising and minimizing your intake of saturated fat. Eat a balanced diet with plenty of fruit and vegetables. Blood tests for lipids and cholesterol should  begin at age 60 and be repeated every 5 years. If your lipid or cholesterol levels are high, you are over 50, or you are at high risk for heart disease, you may need your cholesterol levels checked more frequently. Ongoing high lipid and cholesterol levels should be treated with medicines if diet and exercise are not working.  . If you smoke, find out from your health care provider how to quit. If you do not use tobacco, please do not start.  . If you choose to drink alcohol, please do not consume more than 2 drinks per day. One drink is considered to be 12 ounces (355 mL)  of beer, 5 ounces (148 mL) of wine, or 1.5 ounces (44 mL) of liquor.  . If you are 35-15 years old, ask your health care provider if you should take aspirin to prevent strokes.  . Use sunscreen. Apply sunscreen liberally and repeatedly throughout the day. You should seek shade when your shadow is shorter than you. Protect yourself by wearing long sleeves, pants, a wide-brimmed hat, and sunglasses year round, whenever you are outdoors.  . Once a month, do a whole body skin exam, using a mirror to look at the skin on your back. Tell your health care provider of new moles, moles that have irregular borders, moles that are larger than a pencil eraser, or moles that have changed in shape or color.

## 2017-01-01 NOTE — Telephone Encounter (Signed)
-----   Message from Robert Bellow, LPN sent at 08/20/7351  4:35 PM EDT ----- Regarding: Labs 01/01/17 Please place lab orders.  BCBS (appears to be non-Medicare based on coverages summary)

## 2017-01-01 NOTE — Progress Notes (Signed)
PCP notes:   Health maintenance:  Flu vaccine - addressed Hep C screening - completed PCV13 - administered  Abnormal screenings:   Fall risk - hx of fall without injury Depression score: 2  Patient concerns:   None  Nurse concerns:  None  Next PCP appt:   01/08/17 @ 1630  I reviewed health advisor's note, was available for consultation, and agree with documentation and plan. Roxy Manns MD

## 2017-01-01 NOTE — Progress Notes (Signed)
Subjective:   Troy [name] is a 71 y.o. male who presents for an Initial Medicare Annual Wellness Visit.  Review of Systems  N/A Cardiac Risk Factors include: advanced age (>17men, >53 women);obesity (BMI >30kg/m2);male gender;dyslipidemia;hypertension    Objective:    Today's Vitals   01/01/17 0831  BP: (!) 142/90  Pulse: 63  Temp: (!) 97.5 F (36.4 C)  TempSrc: Oral  SpO2: 97%  Weight: 237 lb 8 oz (107.7 kg)  Height: 6\' 2"  (1.88 m)  PainSc: 0-No pain   Body mass index is 30.49 kg/m.  Current Medications (verified) Outpatient Encounter Prescriptions as of 01/01/2017  Medication Sig  . Ibuprofen 200 MG CAPS Take 400 mg by mouth daily.  . Multiple Vitamin (MULTIVITAMIN) tablet Take 1 tablet by mouth daily.   No facility-administered encounter medications on file as of 01/01/2017.     Allergies (verified) Chlorzoxazone and Sulfur   History: History reviewed. No pertinent past medical history. Past Surgical History:  Procedure Laterality Date  . BACK SURGERY  04/1998  . LIPOMA EXCISION  09/2009  . ROTATOR CUFF REPAIR Left 2012  . TONSILLECTOMY AND ADENOIDECTOMY  age 62   Family History  Problem Relation Age of Onset  . Dementia Mother   . Colon cancer Father 63   Social History   Occupational History  . Not on file.   Social History Main Topics  . Smoking status: Never Smoker  . Smokeless tobacco: Never Used  . Alcohol use No     Comment: quit over 39 years ago  . Drug use: No  . Sexual activity: Not on file   Tobacco Counseling Counseling given: No   Activities of Daily Living In your present state of health, do you have any difficulty performing the following activities: 01/01/2017  Hearing? N  Vision? N  Difficulty concentrating or making decisions? N  Walking or climbing stairs? N  Dressing or bathing? N  Doing errands, shopping? N  Preparing Food and eating ? N  Using the Toilet? N  In the past six months, have you accidently leaked  urine? N  Do you have problems with loss of bowel control? N  Managing your Medications? N  Managing your Finances? N  Housekeeping or managing your Housekeeping? N  Some recent data might be hidden    Immunizations and Health Maintenance Immunization History  Administered Date(s) Administered  . Influenza Whole 02/16/2007, 02/21/2010  . Pneumococcal Conjugate-13 01/01/2017  . Td 11/22/2002  . Tdap 01/26/2015   There are no preventive care reminders to display for this patient.  Patient Care Team: [place], 01/28/2015, MD as PCP - General  Indicate any recent Medical Services you may have received from other than [facility] providers in the past year (date may be approximate).    Assessment:   This is a routine wellness examination for Troy [name].   Hearing/Vision screen  Hearing Screening   125Hz  250Hz  500Hz  1000Hz  2000Hz  3000Hz  4000Hz  6000Hz  8000Hz   Right ear:   40 40 40  40    Left ear:   40 40 40  40    Vision Screening Comments: Last vision exam in July 2018 @ [facility]  Dietary issues and exercise activities discussed: Current Exercise Habits: Home exercise routine, Type of exercise: walking (8000-10000 steps daily while at work), Exercise limited by: None identified  Goals    . Increase physical activity          Starting 01/01/2017, I will continue to walk at  8,000 - 10,000 steps 5 days per week.       Depression Screen PHQ 2/9 Scores 01/01/2017  PHQ - 2 Score 0  PHQ- 9 Score 2    Fall Risk Fall Risk  01/01/2017  Falls in the past year? Yes  Comment pt tripped and fell while walking in parking lot  Number falls in past yr: 1  Injury with Fall? No    Cognitive Function: MMSE - Mini Mental State Exam 01/01/2017  Orientation to time 5  Orientation to Place 5  Registration 3  Attention/ Calculation 0  Recall 3  Language- name 2 objects 0  Language- repeat 1  Language- follow 3 step command 3  Language- read & follow direction 0  Write a sentence 0  Copy  design 0  Total score 20     PLEASE NOTE: A Mini-Cog screen was completed. Maximum score is 20. A value of 0 denotes this part of Folstein MMSE was not completed or the patient failed this part of the Mini-Cog screening.   Mini-Cog Screening Orientation to Time - Max 5 pts Orientation to Place - Max 5 pts Registration - Max 3 pts Recall - Max 3 pts Language Repeat - Max 1 pts Language Follow 3 Step Command - Max 3 pts     Screening Tests Health Maintenance  Topic Date Due  . INFLUENZA VACCINE  08/10/2017 (Originally 12/11/2016)  . PNA vac Low Risk Adult (2 of 2 - PPSV23) 01/01/2018  . COLONOSCOPY  08/20/2018  . TETANUS/TDAP  01/25/2025  . Hepatitis C Screening  Completed        Plan:     I have personally reviewed and addressed the Medicare Annual Wellness questionnaire and have noted the following in the patient's chart:  A. Medical and social history B. Use of alcohol, tobacco or illicit drugs  C. Current medications and supplements D. Functional ability and status E.  Nutritional status F.  Physical activity G. Advance directives H. List of other physicians I.  Hospitalizations, surgeries, and ER visits in previous 12 months J.  Vitals K. Screenings to include hearing, vision, cognitive, depression L. Referrals and appointments - none  In addition, I have reviewed and discussed with patient certain preventive protocols, quality metrics, and best practice recommendations. A written personalized care plan for preventive services as well as general preventive health recommendations were provided to patient.  See attached scanned questionnaire for additional information.   Signed,   Randa Evens, MHA, BS, LPN Health Coach

## 2017-01-02 LAB — HEPATITIS C ANTIBODY: HCV AB: NONREACTIVE

## 2017-01-08 ENCOUNTER — Ambulatory Visit (INDEPENDENT_AMBULATORY_CARE_PROVIDER_SITE_OTHER): Payer: BLUE CROSS/BLUE SHIELD | Admitting: Family Medicine

## 2017-01-08 ENCOUNTER — Encounter: Payer: Self-pay | Admitting: Family Medicine

## 2017-01-08 VITALS — BP 122/70 | HR 60 | Temp 98.0°F | Ht 74.0 in | Wt 241.2 lb

## 2017-01-08 DIAGNOSIS — Z0000 Encounter for general adult medical examination without abnormal findings: Secondary | ICD-10-CM | POA: Diagnosis not present

## 2017-01-08 DIAGNOSIS — Z125 Encounter for screening for malignant neoplasm of prostate: Secondary | ICD-10-CM

## 2017-01-08 DIAGNOSIS — R7309 Other abnormal glucose: Secondary | ICD-10-CM | POA: Diagnosis not present

## 2017-01-08 DIAGNOSIS — E875 Hyperkalemia: Secondary | ICD-10-CM | POA: Diagnosis not present

## 2017-01-08 DIAGNOSIS — E782 Mixed hyperlipidemia: Secondary | ICD-10-CM | POA: Diagnosis not present

## 2017-01-08 DIAGNOSIS — Z800 Family history of malignant neoplasm of digestive organs: Secondary | ICD-10-CM

## 2017-01-08 DIAGNOSIS — I10 Essential (primary) hypertension: Secondary | ICD-10-CM | POA: Diagnosis not present

## 2017-01-08 NOTE — Progress Notes (Signed)
Subjective:    Patient ID: Troy [name], male    DOB: [birth year], 71 y.o.   MRN: [id number]  HPI Here for health maintenance exam and to review chronic medical problems    Has been doing pretty well  Takes care of himself   He had amw visit 01/01/17 Planning to get a flu shot in the fall  PCV 13 was given  Noted hx of fall w/o injury (stumbled over a concrete stop in a parking lot) - did not hurt himself  Learned to look where he is going and not carry too much  Depression score was 2 States his mood is good - wife agrees   [employer] Readings from Last 3 Encounters:  01/08/17 241 lb 4 oz (109.4 kg)  01/01/17 237 lb 8 oz (107.7 kg)  10/16/16 243 lb 4 oz (110.3 kg)   30.97 kg/m    Going to get hooked up at the [facility] as well - may be interested in shingrix when it is available    Had a colonoscopy  4/10  Father had colon cancer at 33  He had appt for recall colonoscopy and had to cancel - wants to re schedule it    Hep C screen neg  Prostate health Lab Results  Component Value Date   PSA 1.65 01/01/2017   PSA 1.18 12/06/2013   PSA 1.91 07/19/2009   no problems with flow/stream  He gets up 0-2 times at night  He tolerates that and goes back to sleep    bp is stable today  No cp or palpitations or headaches or edema  No side effects to medicines  BP Readings from Last 3 Encounters:  01/08/17 122/70  01/01/17 (!) 142/90  10/16/16 130/80      Lab Results  Component Value Date   CREATININE 1.03 01/01/2017   BUN 19 01/01/2017   NA 142 01/01/2017   K 5.3 (H) 01/01/2017   CL 106 01/01/2017   CO2 34 (H) 01/01/2017  takes mvi daily  Eats a balanced diet      Lab Results  Component Value Date   ALT 18 01/01/2017   AST 21 01/01/2017   ALKPHOS 54 01/01/2017   BILITOT 0.6 01/01/2017      hyperlipidemia Lab Results  Component Value Date   CHOL 200 01/01/2017   CHOL 209 (H) 06/09/2015   CHOL 179 12/06/2013   Lab Results  Component Value Date   HDL 57.50  01/01/2017   HDL 62.70 06/09/2015   HDL 55.50 12/06/2013   Lab Results  Component Value Date   LDLCALC 132 (H) 01/01/2017   LDLCALC 122 (H) 06/09/2015   LDLCALC 117 (H) 12/06/2013   Lab Results  Component Value Date   TRIG 56.0 01/01/2017   TRIG 124.0 06/09/2015   TRIG 32.0 12/06/2013   Lab Results  Component Value Date   CHOLHDL 3 01/01/2017   CHOLHDL 3 06/09/2015   CHOLHDL 3 12/06/2013   Lab Results  Component Value Date   LDLDIRECT 136.9 07/19/2009   Diet controlled  LDL is going up  Eats a lot of ice cream and cheese and shellfish  Goes to [employer] for fried fish  Not a lot of beef   Hyperglycemia Lab Results  Component Value Date   HGBA1C 5.8 01/01/2017    Patient Active Problem List   Diagnosis Date Noted  . Hyperkalemia 01/09/2017  . Benign paroxysmal positional vertigo 02/16/2014  . Routine general medical examination at a health  care facility 12/10/2013  . Family history of colon cancer 12/10/2013  . Prostate cancer screening 12/05/2013  . Allergic rhinitis 10/18/2013  . ROTATOR CUFF INJURY, LEFT SHOULDER 04/30/2010  . HYPERGLYCEMIA 08/21/2009  . HYPERLIPIDEMIA 04/20/2007  . Essential hypertension 04/20/2007  . OSTEOARTHRITIS 02/09/2007   No past medical history on file. Past Surgical History:  Procedure Laterality Date  . BACK SURGERY  04/1998  . LIPOMA EXCISION  09/2009  . ROTATOR CUFF REPAIR Left 2012  . TONSILLECTOMY AND ADENOIDECTOMY  age 25   Social History  Substance Use Topics  . Smoking status: Never Smoker  . Smokeless tobacco: Never Used  . Alcohol use No     Comment: quit over 39 years ago   Family History  Problem Relation Age of Onset  . Dementia Mother   . Colon cancer Father 23   Allergies  Allergen Reactions  . Chlorzoxazone     REACTION: dizzy  . Sulfur Rash   Current Outpatient Prescriptions on File Prior to Visit  Medication Sig Dispense Refill  . Ibuprofen 200 MG CAPS Take 400 mg by mouth daily.    .  Multiple Vitamin (MULTIVITAMIN) tablet Take 1 tablet by mouth daily.     No current facility-administered medications on file prior to visit.     Review of Systems    Review of Systems  Constitutional: Negative for fever, appetite change, fatigue and unexpected weight change.  Eyes: Negative for pain and visual disturbance.  Respiratory: Negative for cough and shortness of breath.   Cardiovascular: Negative for cp or palpitations    Gastrointestinal: Negative for nausea, diarrhea and constipation.  Genitourinary: Negative for urgency and frequency.  Skin: Negative for pallor or rash   Neurological: Negative for weakness, light-headedness, numbness and headaches.  Hematological: Negative for adenopathy. Does not bruise/bleed easily.  Psychiatric/Behavioral: Negative for dysphoric mood. The patient is not nervous/anxious.      Objective:   Physical Exam  Constitutional: He appears well-developed and well-nourished. No distress.  obese and well appearing   HENT:  Head: Normocephalic and atraumatic.  Right Ear: External ear normal.  Left Ear: External ear normal.  Nose: Nose normal.  Mouth/Throat: Oropharynx is clear and moist.  Eyes: Pupils are equal, round, and reactive to light. Conjunctivae and EOM are normal. Right eye exhibits no discharge. Left eye exhibits no discharge. No scleral icterus.  Neck: Normal range of motion. Neck supple. No JVD present. Carotid bruit is not present. No thyromegaly present.  Cardiovascular: Normal rate, regular rhythm, normal heart sounds and intact distal pulses.  Exam reveals no gallop.   Pulmonary/Chest: Effort normal and breath sounds normal. No respiratory distress. He has no wheezes. He exhibits no tenderness.  Abdominal: Soft. Bowel sounds are normal. He exhibits no distension, no abdominal bruit and no mass. There is no tenderness.  Musculoskeletal: He exhibits no edema or tenderness.  Lymphadenopathy:    He has no cervical adenopathy.    Neurological: He is alert. He has normal reflexes. No cranial nerve deficit. He exhibits normal muscle tone. Coordination normal.  Skin: Skin is warm and dry. No rash noted. No erythema. No pallor.  Solar lentigines diffusely Some sks   Psychiatric: He has a normal mood and affect. His mood appears not anxious. He does not exhibit a depressed mood.  Pleasant and talkative           Assessment & Plan:   Problem List Items Addressed This Visit      Cardiovascular and Mediastinum  Essential hypertension    bp in fair control at this time  BP Readings from Last 1 Encounters:  01/08/17 122/70   No changes needed Disc lifstyle change with low sodium diet and exercise  Labs reviewed         Other   Family history of colon cancer    Overdue for recall colonoscopy  Ref done       Relevant Orders   Ambulatory referral to Gastroenterology   HYPERGLYCEMIA    Lab Results  Component Value Date   HGBA1C 5.8 01/01/2017   disc imp of low glycemic diet and wt loss to prevent DM2       Hyperkalemia    Mild Lab Results  Component Value Date   K 5.3 (H) 01/01/2017   ? Error or from intake inst to stop mvi since his diet is balanced  Re check 1-2 wk      HYPERLIPIDEMIA    Disc goals for lipids and reasons to control them Rev labs with pt Rev low sat fat diet in detail LDL is going up  Disc diet change in detail and handout given       Prostate cancer screening    Hx of BPH No change in symptoms /not bothered  Lab Results  Component Value Date   PSA 1.65 01/01/2017   PSA 1.18 12/06/2013   PSA 1.91 07/19/2009   continue to follow       Routine general medical examination at a health care facility - Primary    Reviewed health habits including diet and exercise and skin cancer prevention Reviewed appropriate screening tests for age  Also reviewed health mt list, fam hx and immunization status , as well as social and family history   See HPI Labs reviewed  amw  rev  Still working and enjoying it  Will get flu shot in the fall Hep C screen is negative  Referred for colonoscopy screen/ due to fam hx  No change in prostate status  Disc diet - for cholesterol / prediabetes

## 2017-01-08 NOTE — Patient Instructions (Addendum)
Our office will call you regarding colonoscopy appointment   Stop your multivitamin (you don't need it)  Let's check the potassium level in 2 weeks   For cholesterol  Avoid red meat/ fried foods/ egg yolks/ fatty breakfast meats/ butter, cheese and high fat dairy/ and shellfish   Try to cut the fried fish/shellfish    To prevent diabetes :  Try to get most of your carbohydrates from produce (with the exception of white potatoes)  Eat less bread/pasta/rice/snack foods/cereals/sweets and other items from the middle of the grocery store (processed carbs)

## 2017-01-09 DIAGNOSIS — E875 Hyperkalemia: Secondary | ICD-10-CM | POA: Insufficient documentation

## 2017-01-09 NOTE — Assessment & Plan Note (Signed)
Reviewed health habits including diet and exercise and skin cancer prevention Reviewed appropriate screening tests for age  Also reviewed health mt list, fam hx and immunization status , as well as social and family history   See HPI Labs reviewed  amw rev  Still working and enjoying it  Will get flu shot in the fall Hep C screen is negative  Referred for colonoscopy screen/ due to fam hx  No change in prostate status  Disc diet - for cholesterol / prediabetes

## 2017-01-09 NOTE — Assessment & Plan Note (Signed)
bp in fair control at this time  BP Readings from Last 1 Encounters:  01/08/17 122/70   No changes needed Disc lifstyle change with low sodium diet and exercise  Labs reviewed

## 2017-01-09 NOTE — Assessment & Plan Note (Signed)
Mild Lab Results  Component Value Date   K 5.3 (H) 01/01/2017   ? Error or from intake inst to stop mvi since his diet is balanced  Re check 1-2 wk

## 2017-01-09 NOTE — Assessment & Plan Note (Signed)
Disc goals for lipids and reasons to control them Rev labs with pt Rev low sat fat diet in detail LDL is going up  Disc diet change in detail and handout given

## 2017-01-09 NOTE — Assessment & Plan Note (Signed)
Lab Results  Component Value Date   HGBA1C 5.8 01/01/2017   disc imp of low glycemic diet and wt loss to prevent DM2

## 2017-01-09 NOTE — Assessment & Plan Note (Signed)
Overdue for recall colonoscopy  Ref done

## 2017-01-09 NOTE — Assessment & Plan Note (Signed)
Hx of BPH No change in symptoms /not bothered  Lab Results  Component Value Date   PSA 1.65 01/01/2017   PSA 1.18 12/06/2013   PSA 1.91 07/19/2009   continue to follow

## 2017-02-14 ENCOUNTER — Encounter: Payer: Self-pay | Admitting: Family Medicine

## 2017-02-19 ENCOUNTER — Ambulatory Visit (INDEPENDENT_AMBULATORY_CARE_PROVIDER_SITE_OTHER): Payer: BLUE CROSS/BLUE SHIELD

## 2017-02-19 DIAGNOSIS — Z23 Encounter for immunization: Secondary | ICD-10-CM

## 2018-01-09 ENCOUNTER — Ambulatory Visit: Payer: BLUE CROSS/BLUE SHIELD

## 2018-01-14 ENCOUNTER — Ambulatory Visit: Payer: BLUE CROSS/BLUE SHIELD | Admitting: Family Medicine

## 2018-01-20 ENCOUNTER — Encounter: Payer: BLUE CROSS/BLUE SHIELD | Admitting: Family Medicine

## 2018-02-23 DIAGNOSIS — Z23 Encounter for immunization: Secondary | ICD-10-CM | POA: Diagnosis not present

## 2018-12-11 ENCOUNTER — Other Ambulatory Visit: Payer: Self-pay

## 2019-03-11 ENCOUNTER — Other Ambulatory Visit: Payer: Self-pay

## 2019-03-11 ENCOUNTER — Encounter: Payer: Self-pay | Admitting: Family Medicine

## 2019-03-11 ENCOUNTER — Ambulatory Visit: Payer: Self-pay

## 2019-03-11 ENCOUNTER — Ambulatory Visit (INDEPENDENT_AMBULATORY_CARE_PROVIDER_SITE_OTHER): Payer: Medicare Other | Admitting: Family Medicine

## 2019-03-11 VITALS — BP 128/70 | HR 72 | Temp 98.5°F | Wt 227.0 lb

## 2019-03-11 DIAGNOSIS — R29898 Other symptoms and signs involving the musculoskeletal system: Secondary | ICD-10-CM | POA: Diagnosis not present

## 2019-03-11 DIAGNOSIS — M7989 Other specified soft tissue disorders: Secondary | ICD-10-CM | POA: Insufficient documentation

## 2019-03-11 NOTE — Assessment & Plan Note (Signed)
Etiology unclear with onset worsening over the last few weeks. Seems to have started following flu shot but does not fit with Roseanne Kaufman presentation given distal strength is maintained. Referral to Neurology and sed rate. Could be PMR picture.

## 2019-03-11 NOTE — Assessment & Plan Note (Signed)
Suspect the tingling/burning pain is a result of the swelling. Labs to look for electrolyte/kidney source. Unclear if related to flu shot or something else. Ok to continue gabapentin as it helps with pain. Neuro referral.

## 2019-03-11 NOTE — Patient Instructions (Signed)
#  Hand swelling - we will get labs to look for kidney issues which may explain  #Leg weakness - labs - urgent referral to neurology for further evaluation

## 2019-03-11 NOTE — Progress Notes (Signed)
Subjective:     Troy [name] is a 73 y.o. male presenting for Joint Pain (started to have hand, arm and shoulder pain after getting flu shot on 02/01/2019.)     HPI  #Joint pain - hx of major surgery on the right shoulder - got the shot 9/21 - numbness, burning, tingling at night  - treatment: running cold water over the hands - has also noticed bilateral hand swelling - worse in the middle of the night - swelling in the left knee as well - cannot get up out of a chair now - purchased a lift chair - still working at [employer] - difficulty putting his seatbelt on or with dressing - hands with limiting range of motion - increased sensitivity to socks on LE - weakness in the legs L>R due to injury of the knee - feels like he can lift things, but difficulty with fine motor of the hands - thinks he got blood work at the [place]  Did have a local reaction to the flu shot   Went to Urgent care about 2 weeks ago - on [address] - did an XR on his neck  [facility] - thought maybe carpal tunnel   Review of Systems  Constitutional: Negative for chills and fever.  HENT: Negative.   Gastrointestinal: Negative.   Genitourinary: Negative.      Social History   Tobacco Use  Smoking Status Never Smoker  Smokeless Tobacco Never Used        Objective:    BP Readings from Last 3 Encounters:  03/11/19 128/70  01/08/17 122/70  01/01/17 (!) 142/90   Wt Readings from Last 3 Encounters:  03/11/19 227 lb (103 kg)  01/08/17 241 lb 4 oz (109.4 kg)  01/01/17 237 lb 8 oz (107.7 kg)    BP 128/70   Pulse 72   Temp 98.5 F (36.9 C)   Wt 227 lb (103 kg)   SpO2 96%   BMI 29.15 kg/m    Physical Exam Constitutional:      Appearance: Normal appearance. He is not ill-appearing or diaphoretic.  HENT:     Right Ear: External ear normal.     Left Ear: External ear normal.     Nose: Nose normal.  Eyes:     General: No scleral icterus.  Extraocular Movements: Extraocular movements intact.     Conjunctiva/sclera: Conjunctivae normal.  Neck:     Musculoskeletal: Neck supple.  Cardiovascular:     Rate and Rhythm: Normal rate.  Pulmonary:     Effort: Pulmonary effort is normal.  Musculoskeletal:     Comments: Spine: no obvious deformity. No spinous TTP Upper extremity: normal strength testing of the bicep, triceps. Some decrease in wrist flexion/extension. Also unable to make full grip bilaterally. B/l hand swelling. Normal finger intraosseous strength.  LE: normal knee extension/flexion. Normal foot strength. Decreased Hip flexion b/l.   Skin:    General: Skin is warm and dry.  Neurological:     General: No focal deficit present.     Mental Status: He is alert. Mental status is at baseline.     Deep Tendon Reflexes: Reflexes normal.  Psychiatric:        Mood and Affect: Mood normal.           Assessment & Plan:   Problem List Items Addressed This Visit      Nervous and Auditory   Weakness of both lower extremities  Etiology unclear with onset worsening over the last few weeks. Seems to have started following flu shot but does not fit with Roseanne Kaufman presentation given distal strength is maintained. Referral to Neurology and sed rate. Could be PMR picture.       Relevant Orders   Comprehensive metabolic panel   CBC   Sedimentation rate   Ambulatory referral to Neurology     Other   Bilateral hand swelling - Primary    Suspect the tingling/burning pain is a result of the swelling. Labs to look for electrolyte/kidney source. Unclear if related to flu shot or something else. Ok to continue gabapentin as it helps with pain. Neuro referral.       Relevant Orders   Comprehensive metabolic panel   CBC   Sedimentation rate   Ambulatory referral to Neurology       Return if symptoms worsen or fail to improve.  Lesleigh Noe, MD

## 2019-03-11 NOTE — Telephone Encounter (Signed)
I spoke with pts wife and since pt got flu shot on 02/01/19 pt has had lt knee pain, rt shoulder and arm pain, tired from not sleeping at night due to pain, some muscle pain all over body. Pt received flu shot in rt arm. No CP or other covid symptoms, no travel and no known exposure to covid. Pt was seen at [facility] on [address] about 1 1/2 wks ago. Pt was triaged by [facility] and advised to schedule in office this afternoon; pt scheduled appt with Dr Einar Pheasant today at 4:00pm. pts wife did not want to wait for appt with Dr Glori Bickers on 03/12/19.ED precautions given and pts wife voiced understanding.

## 2019-03-11 NOTE — Telephone Encounter (Signed)
Pt.'s wife reports he had a flu vaccine 02/01/19 at the [place] and within that week began having burning in right hand and then both hands. Has progressed to tingling and weakness in both hands. Can't button his clothing or open doors. Having difficulty getting up and down out of chairs. Went to an UC and they did xrays showing bone spurs per wife.States they mentioned possible  Guillain-Barre, but did not go any further.Pt. Is working today, but would like an afternoon appointment if possible.No COVID 19 symptoms. Please call wife to set up appointment.  Reason for Disposition . Neck pain (and neurologic deficit)  Answer Assessment - Initial Assessment Questions 1. SYMPTOM: "What is the main symptom you are concerned about?" (e.g., weakness, numbness)     Burning in both hands,difficulty getting up out of bed and chair 2. ONSET: "When did this start?" (minutes, hours, days; while sleeping)     September 23 after flu vaccine 3. LAST NORMAL: "When was the last time you were normal (no symptoms)?"     September 4. PATTERN "Does this come and go, or has it been constant since it started?"  "Is it present now?"     Constant - getting worse 5. CARDIAC SYMPTOMS: "Have you had any of the following symptoms: chest pain, difficulty breathing, palpitations?"     No 6. NEUROLOGIC SYMPTOMS: "Have you had any of the following symptoms: headache, dizziness, vision loss, double vision, changes in speech, unsteady on your feet?"     Weakness, difficulty putting clothes on and opening a door 7. OTHER SYMPTOMS: "Do you have any other symptoms?"     No 8. PREGNANCY: "Is there any chance you are pregnant?" "When was your last menstrual period?"     n/a  Protocols used: NEUROLOGIC DEFICIT-A-AH

## 2019-03-11 NOTE — Telephone Encounter (Signed)
See visit note from today

## 2019-03-12 ENCOUNTER — Telehealth: Payer: Self-pay | Admitting: Family Medicine

## 2019-03-12 ENCOUNTER — Encounter: Payer: Self-pay | Admitting: Neurology

## 2019-03-12 LAB — CBC
HCT: 40.8 % (ref 39.0–52.0)
Hemoglobin: 14 g/dL (ref 13.0–17.0)
MCHC: 34.2 g/dL (ref 30.0–36.0)
MCV: 88.7 fl (ref 78.0–100.0)
Platelets: 238 10*3/uL (ref 150.0–400.0)
RBC: 4.61 Mil/uL (ref 4.22–5.81)
RDW: 12.1 % (ref 11.5–15.5)
WBC: 6.5 10*3/uL (ref 4.0–10.5)

## 2019-03-12 LAB — COMPREHENSIVE METABOLIC PANEL
ALT: 20 U/L (ref 0–53)
AST: 22 U/L (ref 0–37)
Albumin: 3.9 g/dL (ref 3.5–5.2)
Alkaline Phosphatase: 86 U/L (ref 39–117)
BUN: 18 mg/dL (ref 6–23)
CO2: 29 mEq/L (ref 19–32)
Calcium: 9.1 mg/dL (ref 8.4–10.5)
Chloride: 101 mEq/L (ref 96–112)
Creatinine, Ser: 0.85 mg/dL (ref 0.40–1.50)
GFR: 88.25 mL/min (ref >60.00)
Glucose, Bld: 88 mg/dL (ref 70–99)
Potassium: 4.3 mEq/L (ref 3.5–5.1)
Sodium: 138 mEq/L (ref 135–145)
Total Bilirubin: 0.8 mg/dL (ref 0.2–1.2)
Total Protein: 6.5 g/dL (ref 6.0–8.3)

## 2019-03-12 LAB — SEDIMENTATION RATE: Sed Rate: 27 mm/hr — ABNORMAL HIGH (ref 0–20)

## 2019-03-12 NOTE — Telephone Encounter (Signed)
Called to relay results. [place] at work but wife answered and confirmed ability to give information on file.   Discussed lab results  Normal electrolytes, kidney, liver, cell counts.   Discussed that the Sed Rate was mildly elevated. Noted that this is an inflammatory marker and may be elevated for many reasons including infection or rheumatological disease.   Given that his symptoms do not completely fit a specific diagnosis and has Neurology appointment on 11/10 discussed option of getting additional lab work now vs waiting until neurology appt and following up after that appointment.   They will watch and wait but will notify me if his symptoms worsen so we can get lab work and potentially trend sed rate while awaiting Neurology referral.   FYI to PCP as also mentioned plan to follow-up with Dr. Glori Bickers or [place] PCP after neurology visit.

## 2019-03-16 ENCOUNTER — Ambulatory Visit (INDEPENDENT_AMBULATORY_CARE_PROVIDER_SITE_OTHER): Payer: Medicare Other | Admitting: Family Medicine

## 2019-03-16 ENCOUNTER — Other Ambulatory Visit: Payer: Self-pay

## 2019-03-16 ENCOUNTER — Encounter: Payer: Self-pay | Admitting: Family Medicine

## 2019-03-16 VITALS — BP 134/66 | HR 67 | Temp 97.7°F | Ht 74.0 in | Wt 229.1 lb

## 2019-03-16 DIAGNOSIS — R29898 Other symptoms and signs involving the musculoskeletal system: Secondary | ICD-10-CM | POA: Diagnosis not present

## 2019-03-16 DIAGNOSIS — M2550 Pain in unspecified joint: Secondary | ICD-10-CM | POA: Diagnosis not present

## 2019-03-16 DIAGNOSIS — M7989 Other specified soft tissue disorders: Secondary | ICD-10-CM | POA: Diagnosis not present

## 2019-03-16 NOTE — Assessment & Plan Note (Signed)
This is pronounced- in soft tissues and joints Reassuring cmet and cbc Some mcp joint tenderness Rev last labs (esr of 27) Adding lyme ab, ana, rf today  Unsure if swelling is the cause of pain and tingling (did have pos tinel test on R for hand tingling)  F/u with neuro at the [place] soon He will take his CS xray results from UC They already have hand film reports at the [place]

## 2019-03-16 NOTE — Assessment & Plan Note (Signed)
On exam -no focal weakness/ but swelling and discomfort slow down gait

## 2019-03-16 NOTE — Patient Instructions (Addendum)
Avoid excess sodium  Watch for fever or rash  More labs today for auto immune process  See neurology for follow up as planned

## 2019-03-16 NOTE — Progress Notes (Signed)
Subjective:    Patient ID: Troy [name], male    DOB: 1946-04-04, 73 y.o.   MRN: [id number]  HPI Pt presents with R arm and hand pain   R shoulder pain is improved -? Bursitis/ had rotator cuff surgery years ago    Also L knee problem  Also feeling of weakness in legs   Has had a knee injury- thinks he injured it getting up and down before he got a lift chair    Wt Readings from Last 3 Encounters:  03/16/19 229 lb 1 oz (103.9 kg)  03/11/19 227 lb (103 kg)  01/08/17 241 lb 4 oz (109.4 kg)   29.41 kg/m   Saw Dr Einar Pheasant last month for this  She referred him to neuro- his appt is on 11/10 Checked cbc/sed rate  Results for orders placed or performed in visit on 03/11/19  Comprehensive metabolic panel  Result Value Ref Range   Sodium 138 135 - 145 mEq/L   Potassium 4.3 3.5 - 5.1 mEq/L   Chloride 101 96 - 112 mEq/L   CO2 29 19 - 32 mEq/L   Glucose, Bld 88 70 - 99 mg/dL   BUN 18 6 - 23 mg/dL   Creatinine, Ser 0.85 0.40 - 1.50 mg/dL   Total Bilirubin 0.8 0.2 - 1.2 mg/dL   Alkaline Phosphatase 86 39 - 117 U/L   AST 22 0 - 37 U/L   ALT 20 0 - 53 U/L   Total Protein 6.5 6.0 - 8.3 g/dL   Albumin 3.9 3.5 - 5.2 g/dL   Calcium 9.1 8.4 - 10.5 mg/dL   GFR 88.25 >60.00 mL/min  CBC  Result Value Ref Range   WBC 6.5 4.0 - 10.5 K/uL   RBC 4.61 4.22 - 5.81 Mil/uL   Platelets 238.0 150.0 - 400.0 K/uL   Hemoglobin 14.0 13.0 - 17.0 g/dL   HCT 40.8 39.0 - 52.0 %   MCV 88.7 78.0 - 100.0 fl   MCHC 34.2 30.0 - 36.0 g/dL   RDW 12.1 11.5 - 15.5 %  Sedimentation rate  Result Value Ref Range   Sed Rate 27 (H) 0 - 20 mm/hr     Went to urgent care 2 wk ago and they did xr on neck (to see if he had radiculopathy)  He had neck pain at that time  Given hydrocodone and told to f/u at the [facility] doctor who thought he may have carpal tunnel They did xr of hands-found arthritis   Med list includes Atorvastatin -wife told him to hold/he is holding that  Holding flomax  voltaren gel  -not using/ does not help  Gabapentin -taking , from the [place] for burning in hands  Ibuprofen otc - not taking  Acetaminophen     bp is stable today  No cp or palpitations or headaches or edema  No side effects to medicines  BP Readings from Last 3 Encounters:  03/16/19 134/66  03/11/19 128/70  01/08/17 122/70     Now:  Hands burn like they are on fire Swollen  Very stiff/cannot grip due to pain and stiffness and swelling   R leg feels ok (just weaker than it used to be)    L knee is hurting a lot  All the swelling is in L leg /knee   Started loss of strength in legs  Started a week after flu shot -in sept   Patient Active Problem List   Diagnosis Date Noted  .  Joint pain 03/16/2019  . Bilateral hand swelling 03/11/2019  . Weakness of both lower extremities 03/11/2019  . Hyperkalemia 01/09/2017  . Benign paroxysmal positional vertigo 02/16/2014  . Routine general medical examination at a health care facility 12/10/2013  . Family history of colon cancer 12/10/2013  . Prostate cancer screening 12/05/2013  . Allergic rhinitis 10/18/2013  . ROTATOR CUFF INJURY, LEFT SHOULDER 04/30/2010  . HYPERGLYCEMIA 08/21/2009  . HYPERLIPIDEMIA 04/20/2007  . Essential hypertension 04/20/2007  . OSTEOARTHRITIS 02/09/2007   History reviewed. No pertinent past medical history. Past Surgical History:  Procedure Laterality Date  . BACK SURGERY  04/1998  . LIPOMA EXCISION  09/2009  . ROTATOR CUFF REPAIR Left 2012  . TONSILLECTOMY AND ADENOIDECTOMY  age 15   Social History   Tobacco Use  . Smoking status: Never Smoker  . Smokeless tobacco: Never Used  Substance Use Topics  . Alcohol use: No    Comment: quit over 39 years ago  . Drug use: No   Family History  Problem Relation Age of Onset  . Dementia Mother   . Colon cancer Father 12   Allergies  Allergen Reactions  . Chlorzoxazone     REACTION: dizzy  . Sulfur Rash   Current Outpatient Medications on File Prior to Visit   Medication Sig Dispense Refill  . Acetaminophen (TYLENOL ARTHRITIS PAIN PO) Take by mouth.    Marland Kitchen atorvastatin (LIPITOR) 20 MG tablet Take 20 mg by mouth daily.    . diclofenac sodium (VOLTAREN) 1 % GEL Apply topically 4 (four) times daily.    Kendall Flack 575 MG/5ML SYRP Take by mouth.    . gabapentin (NEURONTIN) 400 MG capsule Take 400 mg by mouth at bedtime.    . Ibuprofen 200 MG CAPS Take 400 mg by mouth daily.    . tamsulosin (FLOMAX) 0.4 MG CAPS capsule Take 0.4 mg by mouth daily.     No current facility-administered medications on file prior to visit.     Review of Systems  Constitutional: Negative for activity change, appetite change, fatigue, fever and unexpected weight change.  HENT: Negative for congestion, rhinorrhea, sore throat and trouble swallowing.   Eyes: Negative for pain, redness, itching and visual disturbance.  Respiratory: Negative for cough, chest tightness, shortness of breath and wheezing.   Cardiovascular: Positive for leg swelling. Negative for chest pain and palpitations.  Gastrointestinal: Negative for abdominal pain, blood in stool, constipation, diarrhea and nausea.  Endocrine: Negative for cold intolerance, heat intolerance, polydipsia and polyuria.  Genitourinary: Negative for difficulty urinating, dysuria, frequency and urgency.  Musculoskeletal: Positive for arthralgias, joint swelling and neck pain. Negative for gait problem, myalgias and neck stiffness.       L knee pain  Hand /foot swelling  Skin: Negative for pallor and rash.  Neurological: Positive for numbness. Negative for dizziness, tremors, weakness and headaches.       Tingling of hands   Hematological: Negative for adenopathy. Does not bruise/bleed easily.  Psychiatric/Behavioral: Negative for decreased concentration and dysphoric mood. The patient is not nervous/anxious.        Objective:   Physical Exam Constitutional:      General: He is not in acute distress.    Appearance: Normal  appearance. He is obese. He is not ill-appearing.  HENT:     Head: Normocephalic and atraumatic.     Mouth/Throat:     Mouth: Mucous membranes are moist.  Eyes:     General: No scleral icterus.  Right eye: No discharge.        Left eye: No discharge.     Extraocular Movements: Extraocular movements intact.     Conjunctiva/sclera: Conjunctivae normal.     Pupils: Pupils are equal, round, and reactive to light.  Neck:     Musculoskeletal: Normal range of motion. No muscular tenderness.  Cardiovascular:     Rate and Rhythm: Normal rate and regular rhythm.     Pulses: Normal pulses.     Heart sounds: Normal heart sounds.  Pulmonary:     Effort: Pulmonary effort is normal. No respiratory distress.     Breath sounds: Normal breath sounds. No wheezing or rales.  Abdominal:     General: Abdomen is flat.     Palpations: Abdomen is soft.  Musculoskeletal:        General: Swelling and tenderness present. No deformity.     Right lower leg: Edema present.     Left lower leg: Edema present.     Comments: Hands are diffusely swollen (left worse)  Soft tissues and joints Mild MCP tenderness  Unable to make a fist due to tightness/swelling  Pos tinel sign R wrist for hand tingling  No indication of frank weakness in hands  L knee -limited rom and crepitus due to pain but no joint line tenderness or effusion   Feet-both swollen- worse on right (not pitting)  No LE weakness Some dec sens to lt touch and temp in R foot  No signs of trauma     Lymphadenopathy:     Cervical: No cervical adenopathy.  Skin:    General: Skin is warm and dry.     Coloration: Skin is not jaundiced or pale.     Findings: No erythema or rash.  Neurological:     Mental Status: He is alert.     Cranial Nerves: No cranial nerve deficit.     Motor: No weakness.     Coordination: Coordination normal.     Deep Tendon Reflexes: Reflexes normal.     Comments: No focal weakness  Pt cannot grip however due to  hand swelling/discomfort   Psychiatric:        Mood and Affect: Mood normal.     Comments: Pleasant  Supportive wife present           Assessment & Plan:   Problem List Items Addressed This Visit      Nervous and Auditory   Weakness of both lower extremities    On exam -no focal weakness/ but swelling and discomfort slow down gait       Relevant Orders   B. burgdorfi antibodies by WB     Other   Bilateral hand swelling - Primary    This is pronounced- in soft tissues and joints Reassuring cmet and cbc Some mcp joint tenderness Rev last labs (esr of 27) Adding lyme ab, ana, rf today  Unsure if swelling is the cause of pain and tingling (did have pos tinel test on R for hand tingling)  F/u with neuro at the [place] soon He will take his CS xray results from UC They already have hand film reports at the [place]      Relevant Orders   ANA   Rheumatoid factor   B. burgdorfi antibodies by WB   Joint pain    Hands/feet with swelling Poss subjective weakness  Some tingling   Esr was 27 Today lyme ab,ana, rf drawn  Has f/u at [place] on 11/10 with  neuro  They have hand films  He will take cs film reports from UC         Relevant Orders   ANA   Rheumatoid factor   B. burgdorfi antibodies by WB

## 2019-03-16 NOTE — Assessment & Plan Note (Signed)
Hands/feet with swelling Poss subjective weakness  Some tingling   Esr was 27 Today lyme ab,ana, rf drawn  Has f/u at [place] on 11/10 with neuro  They have hand films  He will take cs film reports from [facility]

## 2019-03-19 ENCOUNTER — Telehealth: Payer: Self-pay | Admitting: Family Medicine

## 2019-03-19 LAB — B. BURGDORFI ANTIBODIES BY WB

## 2019-03-19 LAB — ANA: Anti Nuclear Antibody (ANA): POSITIVE — AB

## 2019-03-19 LAB — RHEUMATOID FACTOR: Rheumatoid fact SerPl-aCnc: <14 IU/mL (ref <14)

## 2019-03-19 LAB — ANTI-NUCLEAR AB-TITER (ANA TITER)
ANA TITER: 1:80 {titer} — ABNORMAL HIGH
ANA Titer 1: 1:80 {titer} — ABNORMAL HIGH

## 2019-03-19 NOTE — Telephone Encounter (Signed)
Patient's Wife called to see if the patient's lab results are back She stated they received a message in his my chart that his results were back but did not see anything when they opened it   She is requesting a call back

## 2019-03-19 NOTE — Telephone Encounter (Signed)
I just resulted them  See results

## 2019-03-21 ENCOUNTER — Telehealth: Payer: Self-pay | Admitting: Family Medicine

## 2019-03-21 DIAGNOSIS — M2550 Pain in unspecified joint: Secondary | ICD-10-CM

## 2019-03-21 DIAGNOSIS — M7989 Other specified soft tissue disorders: Secondary | ICD-10-CM

## 2019-03-21 DIAGNOSIS — R768 Other specified abnormal immunological findings in serum: Secondary | ICD-10-CM

## 2019-03-21 NOTE — Telephone Encounter (Signed)
-----   Message from Tammi Sou, [place] sent at 03/19/2019  4:04 PM EST ----- Pt's wife was able to view lab results on mychart. Pt did agree with referral to rheumatology please please referral in and I advise pt our [facility] will call to schedule appt

## 2019-03-23 DIAGNOSIS — D8989 Other specified disorders involving the immune mechanism, not elsewhere classified: Secondary | ICD-10-CM | POA: Diagnosis not present

## 2019-03-23 DIAGNOSIS — M1711 Unilateral primary osteoarthritis, right knee: Secondary | ICD-10-CM | POA: Diagnosis not present

## 2019-03-23 DIAGNOSIS — M25561 Pain in right knee: Secondary | ICD-10-CM | POA: Diagnosis not present

## 2019-03-23 DIAGNOSIS — M79643 Pain in unspecified hand: Secondary | ICD-10-CM | POA: Diagnosis not present

## 2019-03-23 DIAGNOSIS — M7989 Other specified soft tissue disorders: Secondary | ICD-10-CM | POA: Diagnosis not present

## 2019-03-23 DIAGNOSIS — R202 Paresthesia of skin: Secondary | ICD-10-CM | POA: Diagnosis not present

## 2019-03-23 DIAGNOSIS — M79671 Pain in right foot: Secondary | ICD-10-CM | POA: Diagnosis not present

## 2019-03-23 DIAGNOSIS — M79641 Pain in right hand: Secondary | ICD-10-CM | POA: Diagnosis not present

## 2019-03-23 DIAGNOSIS — M25462 Effusion, left knee: Secondary | ICD-10-CM | POA: Diagnosis not present

## 2019-03-23 DIAGNOSIS — M79672 Pain in left foot: Secondary | ICD-10-CM | POA: Diagnosis not present

## 2019-03-23 DIAGNOSIS — M5124 Other intervertebral disc displacement, thoracic region: Secondary | ICD-10-CM | POA: Diagnosis not present

## 2019-03-23 DIAGNOSIS — M19042 Primary osteoarthritis, left hand: Secondary | ICD-10-CM | POA: Diagnosis not present

## 2019-03-23 DIAGNOSIS — R768 Other specified abnormal immunological findings in serum: Secondary | ICD-10-CM | POA: Diagnosis not present

## 2019-03-23 DIAGNOSIS — M1712 Unilateral primary osteoarthritis, left knee: Secondary | ICD-10-CM | POA: Diagnosis not present

## 2019-03-23 DIAGNOSIS — M25569 Pain in unspecified knee: Secondary | ICD-10-CM | POA: Diagnosis not present

## 2019-03-23 DIAGNOSIS — M25562 Pain in left knee: Secondary | ICD-10-CM | POA: Diagnosis not present

## 2019-03-23 DIAGNOSIS — M064 Inflammatory polyarthropathy: Secondary | ICD-10-CM | POA: Diagnosis not present

## 2019-03-23 DIAGNOSIS — M19041 Primary osteoarthritis, right hand: Secondary | ICD-10-CM | POA: Diagnosis not present

## 2019-03-23 DIAGNOSIS — M79642 Pain in left hand: Secondary | ICD-10-CM | POA: Diagnosis not present

## 2019-03-23 DIAGNOSIS — M533 Sacrococcygeal disorders, not elsewhere classified: Secondary | ICD-10-CM | POA: Diagnosis not present

## 2019-03-23 DIAGNOSIS — M130 Polyarthritis, unspecified: Secondary | ICD-10-CM | POA: Diagnosis not present

## 2019-03-24 DIAGNOSIS — M064 Inflammatory polyarthropathy: Secondary | ICD-10-CM | POA: Diagnosis not present

## 2019-04-05 ENCOUNTER — Other Ambulatory Visit: Payer: Self-pay

## 2019-04-06 DIAGNOSIS — M25579 Pain in unspecified ankle and joints of unspecified foot: Secondary | ICD-10-CM | POA: Diagnosis not present

## 2019-04-06 DIAGNOSIS — R768 Other specified abnormal immunological findings in serum: Secondary | ICD-10-CM | POA: Diagnosis not present

## 2019-04-06 DIAGNOSIS — M79643 Pain in unspecified hand: Secondary | ICD-10-CM | POA: Diagnosis not present

## 2019-04-06 DIAGNOSIS — M25473 Effusion, unspecified ankle: Secondary | ICD-10-CM | POA: Diagnosis not present

## 2019-04-06 DIAGNOSIS — M2550 Pain in unspecified joint: Secondary | ICD-10-CM | POA: Diagnosis not present

## 2019-04-06 DIAGNOSIS — M1990 Unspecified osteoarthritis, unspecified site: Secondary | ICD-10-CM | POA: Diagnosis not present

## 2019-04-06 DIAGNOSIS — M7989 Other specified soft tissue disorders: Secondary | ICD-10-CM | POA: Diagnosis not present

## 2019-04-06 DIAGNOSIS — R202 Paresthesia of skin: Secondary | ICD-10-CM | POA: Diagnosis not present

## 2019-04-06 DIAGNOSIS — M0609 Rheumatoid arthritis without rheumatoid factor, multiple sites: Secondary | ICD-10-CM | POA: Diagnosis not present

## 2019-04-06 DIAGNOSIS — M064 Inflammatory polyarthropathy: Secondary | ICD-10-CM | POA: Diagnosis not present

## 2019-04-15 NOTE — Progress Notes (Signed)
[facility] Neurology Division Clinic Note - Initial Visit   Date: 04/16/19  Troy [name] MRN: [id number] DOB: Jul 28, 1945   Dear Dr. Selena Batten:  Thank you for your kind referral of Troy [name] for consultation of left hand pain and leg weakness. Although his history is well known to you, please allow [place] to reiterate it for the purpose of our medical record. The patient was accompanied to the clinic by wife who also provides collateral information.     History of Present Illness: Troy [name] is a 72 y.o. right-handed male with hyperlipidemia and BPH presenting for evaluation of gait unsteadiness and hand pain.  Patient had influenza vaccination in September and a week following this, he developed relatively rapid onset of bilateral hand swelling, joint pain, stiffness, and burning sensation of the left hand.  Symptoms limited his ability to perform fine motor tasks, feed himself, and dress himself.  Wife had to assist with these tasks.  He was evaluated by his PCP who checked labs for autoimmune disease, which returned largely unremarkable.  Due to his complaints of burning sensation in the left hand and leg weakness, he was referred to see me.  He was also referred to rheumatology and saw Dr. Kathlen Mody at [facility] who started him on prednisone which has significantly helped his hand swelling and pain for seronegative RA.  He still has reduced range of motion with his joints and is unable to make a fist, but swelling has markedly improved.  After his left wrist swelling improved, his left hand burning has also resolved.  He does take gabapentin 400 mg at bedtime.    When symptoms first started, he was also having significant pain in the hips and knees, limiting his ability to stand up and walk.  Joint pain has also improved after taking prednisone.  At this point, he denies any leg weakness.  He has chronic numbness over the left foot, which they have attributed to his history of low back surgery  many years ago.  He is a retired Curator.  Out-side paper records, electronic medical record, and images have been reviewed where available and summarized as:  Lab Results  Component Value Date   HGBA1C 5.8 01/01/2017   No results found for: [id number] Lab Results  Component Value Date   TSH 1.91 01/01/2017   Lab Results  Component Value Date   ESRSEDRATE 27 (H) 03/11/2019   Labs 03/16/2019:  ANA 1:80, Lyme neg, RF neg  Past medical history Hyperlipidemia, BPH, seronegative RA   Past Surgical History:  Procedure Laterality Date  . BACK SURGERY  04/1998  . LIPOMA EXCISION  09/2009  . ROTATOR CUFF REPAIR Left 2012  . TONSILLECTOMY AND ADENOIDECTOMY  age 55     Medications:  Outpatient Encounter Medications as of 04/16/2019  Medication Sig  . Acetaminophen (TYLENOL ARTHRITIS PAIN PO) Take by mouth.  Marland Kitchen atorvastatin (LIPITOR) 20 MG tablet Take 20 mg by mouth daily.  . diclofenac sodium (VOLTAREN) 1 % GEL Apply topically 4 (four) times daily.  [name] 575 MG/5ML SYRP Take by mouth.  . gabapentin (NEURONTIN) 400 MG capsule Take 400 mg by mouth at bedtime.  . Ibuprofen 200 MG CAPS Take 400 mg by mouth daily.  . tamsulosin (FLOMAX) 0.4 MG CAPS capsule Take 0.4 mg by mouth daily.   No facility-administered encounter medications on file as of 04/16/2019.     Allergies:  Allergies  Allergen Reactions  . Chlorzoxazone     REACTION: dizzy  .  Sulfur Rash    Family History: Family History  Problem Relation Age of Onset  . Dementia Mother   . Colon cancer Father 20    Social History: Social History   Tobacco Use  . Smoking status: Never Smoker  . Smokeless tobacco: Never Used  Substance Use Topics  . Alcohol use: No    Comment: quit over 39 years ago  . Drug use: No   Social History   Social History Narrative   Right handed   Lives in single story home with wife    Review of Systems:  CONSTITUTIONAL: No fevers, chills, night sweats, or weight loss.    EYES: No visual changes or eye pain ENT: No hearing changes.  No history of nose bleeds.   RESPIRATORY: No cough, wheezing and shortness of breath.   CARDIOVASCULAR: Negative for chest pain, and palpitations.   GI: Negative for abdominal discomfort, blood in stools or black stools.  No recent change in bowel habits.   GU:  No history of incontinence.   MUSCLOSKELETAL: +history of joint pain +swelling.  No myalgias.   SKIN: Negative for lesions, rash, and itching.   HEMATOLOGY/ONCOLOGY: Negative for prolonged bleeding, bruising easily, and swollen nodes.  No history of cancer.   ENDOCRINE: Negative for cold or heat intolerance, polydipsia or goiter.   PSYCH:  No depression or anxiety symptoms.   NEURO: As Above.   Vital Signs:  BP 124/75   Pulse 70   Ht 6\' 4"  (1.93 m)   Wt 230 lb (104.3 kg)   SpO2 100%   BMI 28.00 kg/m    General Medical Exam:   General:  Well appearing, comfortable.   Eyes/ENT: see cranial nerve examination.   Neck:   No carotid bruits. Respiratory:  Clear to auscultation, good air entry bilaterally.   Cardiac:  Regular rate and rhythm, no murmur.   Extremities:  Mild edema over the MCP and fingers bilaterally, worse on the left.  No ankle edema Skin:  No rashes or lesions.  Neurological Exam: MENTAL STATUS including orientation to time, place, person, recent and remote memory, attention span and concentration, language, and fund of knowledge is normal.  Speech is not dysarthric.  CRANIAL NERVES: II:  No visual field defects.  III-IV-VI: Pupils equal round and reactive to light.  Normal conjugate, extra-ocular eye movements in all directions of gaze.  No nystagmus.  No ptosis.   VIII:  Normal hearing and vestibular function.   XI:  Normal shoulder shrug and head rotation.    MOTOR:  Mild left ABP atrophy, no fasciculations or abnormal movements.  No pronator drift.   Upper Extremity:  Right  Left  Deltoid  5/5   5/5   Biceps  5/5   5/5   Triceps  5/5    5/5   Infraspinatus 5/5  5/5  Medial pectoralis 5/5  5/5  Wrist extensors  5/5   5/5   Wrist flexors  5/5   5/5   Finger extensors  5/5   5/5   Finger flexors  5/5   5/5   Dorsal interossei  5/5   5/5   Abductor pollicis  5/5   5-/5   Tone (Ashworth scale)  0  0   Lower Extremity:  Right  Left  Hip flexors  5/5   5/5   Hip extensors  5/5   5/5   Adductor 5/5  5/5  Abductor 5/5  5/5  Knee flexors  5/5   5/5  Knee extensors  5/5   5/5   Dorsiflexors  5/5   5/5   Plantarflexors  5/5   5/5   Toe extensors  5/5   5/5   Toe flexors  5/5   5/5   Tone (Ashworth scale)  0  0   MSRs:  Right        Left                  brachioradialis 2+  2+  biceps 2+  2+  triceps 2+  2+  patellar 2+  2+  ankle jerk 1+  1+  Hoffman no  no  plantar response down  down   SENSORY:  Mildly reduced vibration and temperature over the great toe, L >> R.  Hyperesthesia to pin prick over the left median distribution.  Tinel's sign is negative bilaterally  COORDINATION/GAIT: Normal finger-to- nose-finger.  Intact rapid alternating movements bilaterally.  Unable to rise from a chair without using arms.  Gait wide-based, antalgic, stable and unassisted  IMPRESSION: 1.  Left hand paresthesias, likely from carpal tunnel syndrome.  I suspect he has underlying CTS from working as a Curator which was exacerbated by soft tissue swelling causing localizing compression of the median nerve at the wrist.  Since his wrist swelling has markedly improved, his paresthesias have also improved.    2.  Left foot numbness probably from S1 radiculopathy (chronic).    3.  Seronegative rheumatoid arthritis causing bilateral hand swelling, pain, and stiffness.  Followed by Dr. Kathlen Mody at [facility]  PLAN/RECOMMENDATIONS:  NCS/EMG of the left arm and leg Trial off gabapentin, if symptoms become bothersome, restart gabapentin 400mg  qhs   Thank you for allowing me to participate in patient's care.  If I can answer any additional  questions, I would be pleased to do so.    Sincerely,    Adabella Stanis K. Allena Katz, DO

## 2019-04-16 ENCOUNTER — Other Ambulatory Visit: Payer: Self-pay

## 2019-04-16 ENCOUNTER — Encounter: Payer: Self-pay | Admitting: Neurology

## 2019-04-16 ENCOUNTER — Ambulatory Visit (INDEPENDENT_AMBULATORY_CARE_PROVIDER_SITE_OTHER): Payer: Medicare Other | Admitting: Neurology

## 2019-04-16 VITALS — BP 124/75 | HR 70 | Ht 76.0 in | Wt 230.0 lb

## 2019-04-16 DIAGNOSIS — G5602 Carpal tunnel syndrome, left upper limb: Secondary | ICD-10-CM | POA: Diagnosis not present

## 2019-04-16 DIAGNOSIS — R200 Anesthesia of skin: Secondary | ICD-10-CM

## 2019-04-16 DIAGNOSIS — R208 Other disturbances of skin sensation: Secondary | ICD-10-CM | POA: Diagnosis not present

## 2019-04-16 NOTE — Patient Instructions (Signed)
Nerve testing of the left arm and leg.  You can stop the gabapentin and see if it makes any difference in your symptoms.  If pain returns, go back to taking it  ELECTROMYOGRAM AND NERVE CONDUCTION STUDIES (EMG/NCS) INSTRUCTIONS  How to Prepare The neurologist conducting the EMG will need to know if you have certain medical conditions. Tell the neurologist and other EMG lab personnel if you: . Have a pacemaker or any other electrical medical device . Take blood-thinning medications . Have hemophilia, a blood-clotting disorder that causes prolonged bleeding Bathing Take a shower or bath shortly before your exam in order to remove oils from your skin. Don't apply lotions or creams before the exam.  What to Expect You'll likely be asked to change into a hospital gown for the procedure and lie down on an examination table. The following explanations can help you understand what will happen during the exam.  . Electrodes. The neurologist or a technician places surface electrodes at various locations on your skin depending on where you're experiencing symptoms. Or the neurologist may insert needle electrodes at different sites depending on your symptoms.  . Sensations. The electrodes will at times transmit a tiny electrical current that you may feel as a twinge or spasm. The needle electrode may cause discomfort or pain that usually ends shortly after the needle is removed. If you are concerned about discomfort or pain, you may want to talk to the neurologist about taking a short break during the exam.  . Instructions. During the needle EMG, the neurologist will assess whether there is any spontaneous electrical activity when the muscle is at rest - activity that isn't present in healthy muscle tissue - and the degree of activity when you slightly contract the muscle.  He or she will give you instructions on resting and contracting a muscle at appropriate times. Depending on what muscles and nerves the  neurologist is examining, he or she may ask you to change positions during the exam.  After your EMG You may experience some temporary, minor bruising where the needle electrode was inserted into your muscle. This bruising should fade within several days. If it persists, contact your primary care doctor.

## 2019-05-11 ENCOUNTER — Ambulatory Visit (INDEPENDENT_AMBULATORY_CARE_PROVIDER_SITE_OTHER): Payer: Medicare Other | Admitting: Neurology

## 2019-05-11 ENCOUNTER — Other Ambulatory Visit: Payer: Self-pay

## 2019-05-11 DIAGNOSIS — G5602 Carpal tunnel syndrome, left upper limb: Secondary | ICD-10-CM | POA: Diagnosis not present

## 2019-05-11 DIAGNOSIS — M5417 Radiculopathy, lumbosacral region: Secondary | ICD-10-CM

## 2019-05-11 NOTE — Procedures (Signed)
[facility] Neurology  [place], [address]  [place], [place] [address] Tel: [phone] Fax:  [phone] Test Date:  05/11/2019  Patient: Troy [name] DOB: 04/06/1946 Physician: Narda Amber, DO  Sex: Male Height: 6\' 4"  Ref Phys: Narda Amber, DO  ID#: [id number] Temp: 32.0C Technician:    Patient Complaints: This is a 73 year old man with seronegative rheumatoid arthritis referred for evaluation of left hand and foot numbness.  NCV & EMG Findings: Extensive electrodiagnostic testing of the left upper and lower extremity shows: 1. Left median sensory response is absent.  Left ulnar sensory response shows prolonged latency (3.4 ms) and normal amplitude.  Left radial, sural, and superficial peroneal sensory responses are within normal limits. 2. Left median motor response is markedly reduced and shows prolonged latency.  Of note, there is evidence of a left Martin-Gruber anastomosis, as demonstrated by a motor response at the ulnar-wrist and recording at the abductor pollicis brevis.  Left ulnar motor response shows reduced amplitude (L5.7, L3.5 mV) and decreased conduction velocity (A Elbow-B Elbow, L43, L45 m/s).   3. Left tibial and peroneal motor responses show reduced amplitudes. 4. Left tibial H reflex study shows prolonged latency. 5. In the right upper extremity, chronic motor axonal loss changes are seen affecting the ulnar innervated muscles and abductor pollicis brevis, where there is evidence of fibrillation potentials in the latter. 6. In the right lower extremity, chronic motor axonal loss changes were seen affecting all the tested muscles involving L2-S1 myotomes, which is worse at the L5 nerve root level.  There is no accompanying active denervation.  Impression: 1. Left median neuropathy at or distal to the wrist (very severe), consistent with a clinical diagnosis of carpal tunnel syndrome.   2. Left ulnar neuropathy with slowing across the elbow, with demyelinating  and axonal features, moderate. 3. Multilevel chronic lumbosacral radiculopathies affecting bilateral L2-S1 nerve root/segments, worse at L5 level (moderate). 4. Incidentally, there is a left Martin-Gruber anastomosis, a normal anatomic variant.   5. There is no evidence of a sensorimotor polyneuropathy.   ___________________________ Narda Amber, DO    Nerve Conduction Studies Anti Sensory Summary Table   Site NR Peak (ms) Norm Peak (ms) P-T Amp (V) Norm P-T Amp  Left Median Anti Sensory (2nd Digit)  32C  Wrist NR  <3.8  >10  Left Radial Anti Sensory (Base 1st Digit)  32C  Wrist    2.2 <2.8 12.3 >10  Left Sup Peroneal Anti Sensory (Ant Lat Mall)  32C  12 cm    2.6 <4.6 3.2 >3  Left Sural Anti Sensory (Lat Mall)  32C  Calf    3.4 <4.6 3.8 >3  Site 2    3.7  4.6   Left Ulnar Anti Sensory (5th Digit)  32C  Wrist    3.4 <3.2 17.1 >5   Motor Summary Table   Site NR Onset (ms) Norm Onset (ms) O-P Amp (mV) Norm O-P Amp Site1 Site2 Delta-0 (ms) Dist (cm) Vel (m/s) Norm Vel (m/s)  Left Median Motor (Abd Poll Brev)  32C  Wrist    9.9 <4.0 0.5 >5 Elbow Wrist 5.6 30.0 54 >50  Elbow    15.5  0.4  Ulnar-wrist crossover Elbow 11.3 0.0    Ulnar-wrist crossover    4.2  1.8         Left Peroneal Motor (Ext Dig Brev)  32C  Ankle    3.3 <6.0 0.4 >2.5 B Fib Ankle 10.8 43.0 40 >40  B Fib  14.1  0.3  Poplt B Fib 2.2 10.0 45 >40  Poplt    16.3  0.3         Left Peroneal TA Motor (Tib Ant)  32C  Fib Head    3.8 <4.5 2.3 >3 Poplit Fib Head 1.6 10.0 62 >40  Poplit    5.4  2.3         Left Tibial Motor (Abd Hall Brev)  32C  Ankle    4.9 <6.0 1.3 >4 Knee Ankle 8.9 46.0 52 >40  Knee    13.8  0.8         Left Ulnar Motor (Abd Dig Minimi)  32C  Wrist    2.7 <3.1 5.7 >7 B Elbow Wrist 4.7 28.0 60 >50  B Elbow    7.4  5.4  A Elbow B Elbow 2.3 10.0 43 >50  A Elbow    9.7  4.9         Left Ulnar (FDI) Motor (1st DI)  32C  Wrist    4.5 <4.5 3.5 >7 B Elbow Wrist 5.2 28.0 54 >50  B Elbow     9.7  3.0  A Elbow B Elbow 2.2 10.0 45 >50  A Elbow    11.9  2.8          H Reflex Studies   NR H-Lat (ms) Lat Norm (ms) L-R H-Lat (ms)  Left Tibial (Gastroc)  32C     37.14 <35    EMG   Side Muscle Ins Act Fibs Psw Fasc Number Recrt Dur Dur. Amp Amp. Poly Poly. Comment  Left AntTibialis Nml Nml Nml Nml 3- Rapid All 1+ All 1+ All 1+ N/A  Left Gastroc Nml Nml Nml Nml 2- Rapid Some 1+ Some 1+ Nml Nml N/A  Left RectFemoris Nml Nml Nml Nml 1- Rapid Few 1+ Few 1+ Few 1+ N/A  Left GluteusMed Nml Nml Nml Nml 2- Rapid Many 1+ Many 1+ Nml Nml N/A  Left AdductorLong Nml Nml Nml Nml 1- Rapid Few 1+ Few 1+ Nml Nml N/A  Left BicepsFemS Nml Nml Nml Nml 1- Rapid Some 1+ Some 1+ Nml Nml N/A  Left 1stDorInt Nml Nml Nml Nml 3- Rapid All 1+ All 1+ All 1+ N/A  Left Abd Poll Brev Nml 1+ Nml Nml [facility] Rapid Nml Nml Nml Nml Nml Nml N/A  Left Ext Indicis Nml Nml Nml Nml 1- Rapid Some 1+ Some 1+ Nml Nml N/A  Left PronatorTeres Nml Nml Nml Nml Nml Nml Nml Nml Nml Nml Nml Nml N/A  Left Triceps Nml Nml Nml Nml 1- Rapid Some 1+ Some 1+ Some 1+ N/A  Left Biceps Nml Nml Nml Nml Nml Nml Nml Nml Nml Nml Nml Nml N/A  Left Deltoid Nml Nml Nml Nml Nml Nml Nml Nml Nml Nml Nml Nml N/A  Left FlexCarpiUln Nml Nml Nml Nml 2- Rapid Many 1+ Many 1+ Nml Nml N/A  Left ABD Dig Min Nml Nml Nml Nml 3- Rapid All 1+ All 1+ All 1+ N/A      Waveforms:

## 2019-05-12 ENCOUNTER — Telehealth: Payer: Self-pay | Admitting: Neurology

## 2019-05-12 DIAGNOSIS — G5622 Lesion of ulnar nerve, left upper limb: Secondary | ICD-10-CM

## 2019-05-12 DIAGNOSIS — G5602 Carpal tunnel syndrome, left upper limb: Secondary | ICD-10-CM

## 2019-05-12 NOTE — Telephone Encounter (Signed)
Results of EMG discussed with at patient and his wife as well as management options as noted below: 1.  Left carpal tunnel syndrome, very severe.   Unlikely that he will benefit from conservative therapies based on severity, so will make a referral to Dr. Milly Jakob at [facility] (he has seen Dr. Tamera Punt there for his right shoulder previously) 2.  Left ulnar neuropathy at the elbow, moderate. He admits to habitually resting his left elbow on his recliner and I advised him against this, as well as minimizing hyperflexion at the elbow.  He can use a towel wrap to serve as a block and limit compression of the nerve 3.  Multilevel lumbosacral radiculopathy affecting the left leg He currently denies any leg weakness, instability, or pain.  He has chronic left foot numbness, which has been present for many years.  If he develops any radicular symptoms of the legs, low threshold to obtain MRI lumbar spine.   All questions were answered.

## 2019-05-13 NOTE — Telephone Encounter (Signed)
Referral placed and faxed.

## 2019-05-13 NOTE — Addendum Note (Signed)
Addended by: Amado Coe on: 05/13/2019 08:07 AM   Modules accepted: Orders

## 2019-05-25 ENCOUNTER — Telehealth: Payer: Self-pay | Admitting: Neurology

## 2019-05-25 NOTE — Telephone Encounter (Signed)
Referral was sent to Dr Janee Morn at [facility], patients wife is on [facility] and given the number to call and schedule patient

## 2019-05-25 NOTE — Telephone Encounter (Signed)
Patient's wife called to follow up about a referral for the patient's hand. To date, they've not been contacted for scheduling.

## 2019-05-26 DIAGNOSIS — M0609 Rheumatoid arthritis without rheumatoid factor, multiple sites: Secondary | ICD-10-CM | POA: Diagnosis not present

## 2019-06-03 DIAGNOSIS — M25642 Stiffness of left hand, not elsewhere classified: Secondary | ICD-10-CM | POA: Diagnosis not present

## 2019-06-03 DIAGNOSIS — M06042 Rheumatoid arthritis without rheumatoid factor, left hand: Secondary | ICD-10-CM | POA: Diagnosis not present

## 2019-06-03 DIAGNOSIS — R200 Anesthesia of skin: Secondary | ICD-10-CM | POA: Diagnosis not present

## 2019-06-16 DIAGNOSIS — M1990 Unspecified osteoarthritis, unspecified site: Secondary | ICD-10-CM | POA: Diagnosis not present

## 2019-06-16 DIAGNOSIS — M79643 Pain in unspecified hand: Secondary | ICD-10-CM | POA: Diagnosis not present

## 2019-06-16 DIAGNOSIS — M25579 Pain in unspecified ankle and joints of unspecified foot: Secondary | ICD-10-CM | POA: Diagnosis not present

## 2019-06-16 DIAGNOSIS — M25473 Effusion, unspecified ankle: Secondary | ICD-10-CM | POA: Diagnosis not present

## 2019-06-16 DIAGNOSIS — M130 Polyarthritis, unspecified: Secondary | ICD-10-CM | POA: Diagnosis not present

## 2019-06-16 DIAGNOSIS — Z79899 Other long term (current) drug therapy: Secondary | ICD-10-CM | POA: Diagnosis not present

## 2019-06-16 DIAGNOSIS — R202 Paresthesia of skin: Secondary | ICD-10-CM | POA: Diagnosis not present

## 2019-06-16 DIAGNOSIS — R768 Other specified abnormal immunological findings in serum: Secondary | ICD-10-CM | POA: Diagnosis not present

## 2019-06-16 DIAGNOSIS — M0609 Rheumatoid arthritis without rheumatoid factor, multiple sites: Secondary | ICD-10-CM | POA: Diagnosis not present

## 2019-06-16 DIAGNOSIS — M7989 Other specified soft tissue disorders: Secondary | ICD-10-CM | POA: Diagnosis not present

## 2019-07-06 ENCOUNTER — Ambulatory Visit: Payer: PPO | Attending: Internal Medicine

## 2019-07-06 DIAGNOSIS — Z20822 Contact with and (suspected) exposure to covid-19: Secondary | ICD-10-CM | POA: Diagnosis not present

## 2019-07-07 LAB — NOVEL CORONAVIRUS, NAA: SARS-CoV-2, NAA: NOT DETECTED

## 2019-07-14 DIAGNOSIS — M25579 Pain in unspecified ankle and joints of unspecified foot: Secondary | ICD-10-CM | POA: Diagnosis not present

## 2019-07-14 DIAGNOSIS — Z79899 Other long term (current) drug therapy: Secondary | ICD-10-CM | POA: Diagnosis not present

## 2019-07-14 DIAGNOSIS — M79643 Pain in unspecified hand: Secondary | ICD-10-CM | POA: Diagnosis not present

## 2019-07-14 DIAGNOSIS — M25473 Effusion, unspecified ankle: Secondary | ICD-10-CM | POA: Diagnosis not present

## 2019-07-14 DIAGNOSIS — M130 Polyarthritis, unspecified: Secondary | ICD-10-CM | POA: Diagnosis not present

## 2019-07-14 DIAGNOSIS — M1990 Unspecified osteoarthritis, unspecified site: Secondary | ICD-10-CM | POA: Diagnosis not present

## 2019-07-14 DIAGNOSIS — M0609 Rheumatoid arthritis without rheumatoid factor, multiple sites: Secondary | ICD-10-CM | POA: Diagnosis not present

## 2019-07-14 DIAGNOSIS — R768 Other specified abnormal immunological findings in serum: Secondary | ICD-10-CM | POA: Diagnosis not present

## 2019-07-14 DIAGNOSIS — M7989 Other specified soft tissue disorders: Secondary | ICD-10-CM | POA: Diagnosis not present

## 2019-08-25 DIAGNOSIS — M25579 Pain in unspecified ankle and joints of unspecified foot: Secondary | ICD-10-CM | POA: Diagnosis not present

## 2019-08-25 DIAGNOSIS — Z79899 Other long term (current) drug therapy: Secondary | ICD-10-CM | POA: Diagnosis not present

## 2019-08-25 DIAGNOSIS — M79643 Pain in unspecified hand: Secondary | ICD-10-CM | POA: Diagnosis not present

## 2019-08-25 DIAGNOSIS — M0609 Rheumatoid arthritis without rheumatoid factor, multiple sites: Secondary | ICD-10-CM | POA: Diagnosis not present

## 2019-08-25 DIAGNOSIS — M7989 Other specified soft tissue disorders: Secondary | ICD-10-CM | POA: Diagnosis not present

## 2019-08-25 DIAGNOSIS — M130 Polyarthritis, unspecified: Secondary | ICD-10-CM | POA: Diagnosis not present

## 2019-08-25 DIAGNOSIS — M1990 Unspecified osteoarthritis, unspecified site: Secondary | ICD-10-CM | POA: Diagnosis not present

## 2019-08-25 DIAGNOSIS — R768 Other specified abnormal immunological findings in serum: Secondary | ICD-10-CM | POA: Diagnosis not present

## 2019-09-14 ENCOUNTER — Ambulatory Visit (INDEPENDENT_AMBULATORY_CARE_PROVIDER_SITE_OTHER): Payer: PPO | Admitting: Family Medicine

## 2019-09-14 ENCOUNTER — Other Ambulatory Visit: Payer: Self-pay

## 2019-09-14 ENCOUNTER — Encounter: Payer: Self-pay | Admitting: Family Medicine

## 2019-09-14 VITALS — BP 118/68 | HR 67 | Temp 98.1°F | Ht 76.0 in | Wt 227.1 lb

## 2019-09-14 DIAGNOSIS — I10 Essential (primary) hypertension: Secondary | ICD-10-CM | POA: Diagnosis not present

## 2019-09-14 DIAGNOSIS — E860 Dehydration: Secondary | ICD-10-CM | POA: Diagnosis not present

## 2019-09-14 DIAGNOSIS — R413 Other amnesia: Secondary | ICD-10-CM | POA: Diagnosis not present

## 2019-09-14 DIAGNOSIS — R7303 Prediabetes: Secondary | ICD-10-CM

## 2019-09-14 DIAGNOSIS — R3129 Other microscopic hematuria: Secondary | ICD-10-CM

## 2019-09-14 DIAGNOSIS — R410 Disorientation, unspecified: Secondary | ICD-10-CM | POA: Diagnosis not present

## 2019-09-14 DIAGNOSIS — R319 Hematuria, unspecified: Secondary | ICD-10-CM | POA: Insufficient documentation

## 2019-09-14 DIAGNOSIS — M0609 Rheumatoid arthritis without rheumatoid factor, multiple sites: Secondary | ICD-10-CM

## 2019-09-14 DIAGNOSIS — M069 Rheumatoid arthritis, unspecified: Secondary | ICD-10-CM | POA: Insufficient documentation

## 2019-09-14 DIAGNOSIS — G3184 Mild cognitive impairment, so stated: Secondary | ICD-10-CM | POA: Insufficient documentation

## 2019-09-14 LAB — POC URINALSYSI DIPSTICK (AUTOMATED)
Bilirubin, UA: NEGATIVE
Blood, UA: 200
Glucose, UA: NEGATIVE
Ketones, UA: 5
Leukocytes, UA: NEGATIVE
Nitrite, UA: NEGATIVE
Protein, UA: POSITIVE — AB
Spec Grav, UA: >=1.03 — AB (ref 1.010–1.025)
Urobilinogen, UA: 0.2 E.U./dL
pH, UA: 5.5 (ref 5.0–8.0)

## 2019-09-14 NOTE — Assessment & Plan Note (Signed)
bp in fair control at this time  BP Readings from Last 1 Encounters:  09/14/19 118/68   No changes needed Most recent labs reviewed  Disc lifstyle change with low sodium diet and exercise

## 2019-09-14 NOTE — Assessment & Plan Note (Signed)
Microscopic blood on ua today (as well as protein and high SG) Culture pending  No urinary symptoms beyond BPH  ? If possible uti given issues with mental status/memory  If neg cx will need re check with better hydration and then urology f/u if there is still blood

## 2019-09-14 NOTE — Patient Instructions (Addendum)
Work on fluid intake - 64 oz of fluid per day- mostly water   Urine culture is pending   Labs today   We will make a plan when everything returns Be safe Continue reminders  Try to have good sleep habits

## 2019-09-14 NOTE — Assessment & Plan Note (Signed)
High SG in urine with ketones  ? If related to mental status changes  On high risk medications also  Disc inc fluids to 64 oz daily  Labs today  Continue to follow

## 2019-09-14 NOTE — Assessment & Plan Note (Signed)
A1C today -may be up due to prednisone Per pt he likes sweets but does not eat them often  Has not had a lot of wt gain with prednisone

## 2019-09-14 NOTE — Assessment & Plan Note (Signed)
Currently in study for baricitnib  Recently cut prednisone to 5 mg (in light of mental status change)-unsure if this is related Also continues methotrexate Symptoms are controlled enough to return to work

## 2019-09-14 NOTE — Progress Notes (Signed)
Subjective:    Patient ID: Troy [name], male    DOB: July 06, 1945, 74 y.o.   MRN: [id number]  This visit occurred during the SARS-CoV-2 public health emergency.  Safety protocols were in place, including screening questions prior to the visit, additional usage of staff PPE, and extensive cleaning of exam room while observing appropriate contact time as indicated for disinfecting solutions.    HPI Pt presents with c/o of confusion   Wt Readings from Last 3 Encounters:  09/14/19 227 lb 2 oz (103 kg)  04/16/19 230 lb (104.3 kg)  03/16/19 229 lb 1 oz (103.9 kg)   27.65 kg/m   Episodes of confusion  Came home from work- said he was worried about alzheimers  Asks questions over and over  Wife thinks he does not pay attention / trouble concentrating   occ forgets things at work  Just went back to work (3rd week) -struggling a bit   Sleep is fair/not great   Has felt depressed - at home there was nothing for him to do and this really got him down Would just sit on the couch  Was limited by joint issues  This is improved coming back to work   No delusions/hallucinations or paranoia  Not irritable -just frustrated    BP Readings from Last 3 Encounters:  09/14/19 118/68  04/16/19 124/75  03/16/19 134/66   Pulse Readings from Last 3 Encounters:  09/14/19 67  04/16/19 70  03/16/19 67    Rheumatoid arthritis -fairly controlled  olumiant (baricitinib)- is in a study  Prednisone Methotrexate Folic acid  Not taking any other vitamins   Rheumatology wanted to put him on humira (could not afford)   Wife did call rheumatology office re: confusion and they told her to cut his prednisone in 1/2 (2 weeks)   No new physical symptoms   Hx- was exp to agent orange in [place] Has appt this mo wit the [place]   Has appt next week for methotrexate labs    Ua:- protein and ketones and blood  Results for orders placed or performed in visit on 09/14/19  POCT Urinalysis Dipstick  (Automated)  Result Value Ref Range   Color, UA Dark Yellow    Clarity, UA Clear    Glucose, UA Negative Negative   Bilirubin, UA Negative    Ketones, UA 5 mg/dL    Spec Grav, UA >=1.017 (A) 1.010 - 1.025   Blood, UA 200 Ery/uL    pH, UA 5.5 5.0 - 8.0   Protein, UA Positive (A) Negative   Urobilinogen, UA 0.2 0.2 or 1.0 E.U./dL   Nitrite, UA Negative    Leukocytes, UA Negative Negative    He eats regularly  May not drink enough water   No dysuria  Urine has been darker/cloudy  ? If any changes in frequency   Had his covid vaccines already    Patient Active Problem List   Diagnosis Date Noted  . Rheumatoid arthritis (HCC) 09/14/2019  . Hematuria 09/14/2019  . Short-term memory loss 09/14/2019  . Positive ANA (antinuclear antibody) 03/21/2019  . Joint pain 03/16/2019  . Bilateral hand swelling 03/11/2019  . Weakness of both lower extremities 03/11/2019  . Hyperkalemia 01/09/2017  . Benign paroxysmal positional vertigo 02/16/2014  . Routine general medical examination at a health care facility 12/10/2013  . Family history of colon cancer 12/10/2013  . Prostate cancer screening 12/05/2013  . Allergic rhinitis 10/18/2013  . ROTATOR CUFF INJURY, LEFT SHOULDER 04/30/2010  .  Prediabetes 08/21/2009  . HYPERLIPIDEMIA 04/20/2007  . Essential hypertension 04/20/2007  . OSTEOARTHRITIS 02/09/2007   History reviewed. No pertinent past medical history. Past Surgical History:  Procedure Laterality Date  . BACK SURGERY  04/1998  . LIPOMA EXCISION  09/2009  . ROTATOR CUFF REPAIR Left 2012  . TONSILLECTOMY AND ADENOIDECTOMY  age 14   Social History   Tobacco Use  . Smoking status: Never Smoker  . Smokeless tobacco: Never Used  Substance Use Topics  . Alcohol use: No    Comment: quit over 39 years ago  . Drug use: No   Family History  Problem Relation Age of Onset  . Dementia Mother   . Colon cancer Father 68   Allergies  Allergen Reactions  . Chlorzoxazone      REACTION: dizzy  . Sulfur Rash   Current Outpatient Medications on File Prior to Visit  Medication Sig Dispense Refill  . Acetaminophen (TYLENOL ARTHRITIS PAIN PO) Take by mouth.    . baricitinib (OLUMIANT) 2 MG TABS tablet Take 2 mg by mouth daily.    [name] 575 MG/5ML SYRP Take by mouth.    . folic acid (FOLVITE) 1 MG tablet Take 1 mg by mouth daily.    . methotrexate (RHEUMATREX) 2.5 MG tablet Take 8 tablets by mouth once a week.    . predniSONE (DELTASONE) 5 MG tablet Take 2.5 mg by mouth daily.     No current facility-administered medications on file prior to visit.     Review of Systems  Constitutional: Positive for activity change. Negative for appetite change, fever and unexpected weight change.  HENT: Negative for congestion, rhinorrhea, sore throat and trouble swallowing.   Eyes: Negative for pain, redness, itching and visual disturbance.  Respiratory: Negative for cough, chest tightness, shortness of breath and wheezing.   Cardiovascular: Negative for chest pain and palpitations.  Gastrointestinal: Negative for abdominal pain, blood in stool, constipation, diarrhea and nausea.  Endocrine: Negative for cold intolerance, heat intolerance, polydipsia and polyuria.  Genitourinary: Negative for difficulty urinating, dysuria, frequency and urgency.  Musculoskeletal: Positive for arthralgias and joint swelling. Negative for myalgias.  Skin: Negative for color change, pallor and rash.  Neurological: Negative for dizziness, tremors, weakness, numbness and headaches.  Hematological: Negative for adenopathy. Does not bruise/bleed easily.  Psychiatric/Behavioral: Positive for confusion, decreased concentration, dysphoric mood and sleep disturbance. Negative for agitation. The patient is not nervous/anxious.        Objective:   Physical Exam Constitutional:      General: He is not in acute distress.    Appearance: He is well-developed and normal weight. He is not ill-appearing  or diaphoretic.  HENT:     Head: Normocephalic and atraumatic.  Eyes:     Conjunctiva/sclera: Conjunctivae normal.     Pupils: Pupils are equal, round, and reactive to light.  Neck:     Thyroid: No thyromegaly.     Vascular: No carotid bruit or JVD.  Cardiovascular:     Rate and Rhythm: Normal rate and regular rhythm.     Pulses: Normal pulses.     Heart sounds: Normal heart sounds. No gallop.   Pulmonary:     Effort: Pulmonary effort is normal. No respiratory distress.     Breath sounds: Normal breath sounds. No wheezing or rales.  Abdominal:     General: Bowel sounds are normal. There is no distension or abdominal bruit.     Palpations: Abdomen is soft. There is no mass.  Tenderness: There is no abdominal tenderness. There is no right CVA tenderness or left CVA tenderness.     Comments: No suprapubic tenderness or fullness    Musculoskeletal:     Cervical back: Normal range of motion and neck supple.     Right lower leg: No edema.     Left lower leg: No edema.     Comments: Joint changes of OA  Lymphadenopathy:     Cervical: No cervical adenopathy.  Skin:    General: Skin is warm and dry.     Coloration: Skin is not pale.     Findings: No erythema or rash.  Neurological:     Mental Status: He is alert.     Cranial Nerves: No cranial nerve deficit.     Sensory: No sensory deficit.     Motor: No weakness.     Coordination: Coordination normal.     Deep Tendon Reflexes: Reflexes are normal and symmetric. Reflexes normal.  Psychiatric:        Attention and Perception: Attention normal.        Mood and Affect: Mood normal.        Speech: Speech normal.        Behavior: Behavior normal.        Cognition and Memory: Cognition is impaired. Memory is impaired.     Comments: Decreased short term memory  Wife helps with history  Not acutely confused today            Assessment & Plan:   Problem List Items Addressed This Visit      Cardiovascular and Mediastinum     Essential hypertension    bp in fair control at this time  BP Readings from Last 1 Encounters:  09/14/19 118/68   No changes needed Most recent labs reviewed  Disc lifstyle change with low sodium diet and exercise          Musculoskeletal and Integument   Rheumatoid arthritis (HCC)    Currently in study for baricitnib  Recently cut prednisone to 5 mg (in light of mental status change)-unsure if this is related Also continues methotrexate Symptoms are controlled enough to return to work      Relevant Medications   predniSONE (DELTASONE) 5 MG tablet   methotrexate (RHEUMATREX) 2.5 MG tablet   baricitinib (OLUMIANT) 2 MG TABS tablet     Other   Prediabetes    A1C today -may be up due to prednisone Per pt he likes sweets but does not eat them often  Has not had a lot of wt gain with prednisone       Relevant Orders   Comprehensive metabolic panel   Hemoglobin A1c   Hematuria    Microscopic blood on ua today (as well as protein and high SG) Culture pending  No urinary symptoms beyond BPH  ? If possible uti given issues with mental status/memory  If neg cx will need re check with better hydration and then urology f/u if there is still blood       Relevant Orders   Urine Culture   CBC with Differential/Platelet   Short-term memory loss    Along with decreased concentration  Suspect multifactorial  Multiple new medicines with new RA diagnosis  Also dehydration based on ua  Possible uti  Stress/illness and possible depression symptoms   Labs today  Will include B12 and vit D levels  Will hydrate/ also pend urine cx  Consider return for MMS exam  Relevant Orders   TSH   Vitamin B12   VITAMIN D 25 Hydroxy (Vit-D Deficiency, Fractures)   Mild dehydration    High SG in urine with ketones  ? If related to mental status changes  On high risk medications also  Disc inc fluids to 64 oz daily  Labs today  Continue to follow       Other Visit Diagnoses     Confusion    -  Primary   Relevant Orders   POCT Urinalysis Dipstick (Automated) (Completed)   CBC with Differential/Platelet   Comprehensive metabolic panel   TSH   Vitamin B12   VITAMIN D 25 Hydroxy (Vit-D Deficiency, Fractures)

## 2019-09-14 NOTE — Assessment & Plan Note (Signed)
Along with decreased concentration  Suspect multifactorial  Multiple new medicines with new RA diagnosis  Also dehydration based on ua  Possible uti  Stress/illness and possible depression symptoms   Labs today  Will include B12 and vit D levels  Will hydrate/ also pend urine cx  Consider return for MMS exam

## 2019-09-15 LAB — COMPREHENSIVE METABOLIC PANEL
ALT: 24 U/L (ref 0–53)
AST: 26 U/L (ref 0–37)
Albumin: 4.2 g/dL (ref 3.5–5.2)
Alkaline Phosphatase: 43 U/L (ref 39–117)
BUN: 21 mg/dL (ref 6–23)
CO2: 33 mEq/L — ABNORMAL HIGH (ref 19–32)
Calcium: 9.4 mg/dL (ref 8.4–10.5)
Chloride: 104 mEq/L (ref 96–112)
Creatinine, Ser: 0.91 mg/dL (ref 0.40–1.50)
GFR: 81.46 mL/min (ref >60.00)
Glucose, Bld: 92 mg/dL (ref 70–99)
Potassium: 4.5 mEq/L (ref 3.5–5.1)
Sodium: 141 mEq/L (ref 135–145)
Total Bilirubin: 0.8 mg/dL (ref 0.2–1.2)
Total Protein: 6.2 g/dL (ref 6.0–8.3)

## 2019-09-15 LAB — CBC WITH DIFFERENTIAL/PLATELET
Basophils Absolute: 0 10*3/uL (ref 0.0–0.1)
Basophils Relative: 0.4 % (ref 0.0–3.0)
Eosinophils Absolute: 0 10*3/uL (ref 0.0–0.7)
Eosinophils Relative: 0.5 % (ref 0.0–5.0)
HCT: 41.8 % (ref 39.0–52.0)
Hemoglobin: 14.3 g/dL (ref 13.0–17.0)
Lymphocytes Relative: 13 % (ref 12.0–46.0)
Lymphs Abs: 0.8 10*3/uL (ref 0.7–4.0)
MCHC: 34.1 g/dL (ref 30.0–36.0)
MCV: 93.1 fl (ref 78.0–100.0)
Monocytes Absolute: 0.5 10*3/uL (ref 0.1–1.0)
Monocytes Relative: 7.5 % (ref 3.0–12.0)
Neutro Abs: 4.9 10*3/uL (ref 1.4–7.7)
Neutrophils Relative %: 78.6 % — ABNORMAL HIGH (ref 43.0–77.0)
Platelets: 228 10*3/uL (ref 150.0–400.0)
RBC: 4.49 Mil/uL (ref 4.22–5.81)
RDW: 14.2 % (ref 11.5–15.5)
WBC: 6.2 10*3/uL (ref 4.0–10.5)

## 2019-09-15 LAB — HEMOGLOBIN A1C: Hgb A1c MFr Bld: 5.5 % (ref 4.6–6.5)

## 2019-09-15 LAB — URINE CULTURE
MICRO NUMBER:: 10437222
Result:: NO GROWTH
SPECIMEN QUALITY:: ADEQUATE

## 2019-09-15 LAB — VITAMIN D 25 HYDROXY (VIT D DEFICIENCY, FRACTURES): VITD: 27.17 ng/mL — ABNORMAL LOW (ref 30.00–100.00)

## 2019-09-15 LAB — VITAMIN B12: Vitamin B-12: 300 pg/mL (ref 211–911)

## 2019-09-15 LAB — TSH: TSH: 1.06 u[IU]/mL (ref 0.35–4.50)

## 2019-09-16 ENCOUNTER — Telehealth: Payer: Self-pay | Admitting: *Deleted

## 2019-09-16 NOTE — Telephone Encounter (Signed)
Addressed through result notes, wife will call back and schedule an appt to discuss pt's memory

## 2019-09-16 NOTE — Telephone Encounter (Signed)
Patient's wife returned your call in regards to patient's results

## 2019-09-16 NOTE — Telephone Encounter (Signed)
Left VM requesting pt to call the office back regarding urine cx results  

## 2019-09-16 NOTE — Telephone Encounter (Signed)
Left 2nd VM requesting wife to call the office back

## 2019-09-16 NOTE — Telephone Encounter (Signed)
-----   Message from Judy Pimple, MD sent at 09/16/2019  8:19 AM EDT ----- Negative urine culture  Please continue to hydrate as we discussed  Follow up in the next week of 2 please and we will run through a memory/cognition test

## 2019-09-17 ENCOUNTER — Telehealth: Payer: Self-pay | Admitting: Family Medicine

## 2019-09-17 NOTE — Telephone Encounter (Signed)
done

## 2019-09-17 NOTE — Telephone Encounter (Signed)
Patient's Wife Stanton Kidney called  She stated that the patient is needing a note for work that states he was in the office for his appointment on 5/04  Stanton Kidney is requesting this note be sent via my chart so they can print and take to his work

## 2019-09-21 DIAGNOSIS — M0609 Rheumatoid arthritis without rheumatoid factor, multiple sites: Secondary | ICD-10-CM | POA: Diagnosis not present

## 2019-09-24 ENCOUNTER — Encounter: Payer: Self-pay | Admitting: Family Medicine

## 2019-09-24 ENCOUNTER — Other Ambulatory Visit: Payer: Self-pay

## 2019-09-24 ENCOUNTER — Ambulatory Visit (INDEPENDENT_AMBULATORY_CARE_PROVIDER_SITE_OTHER): Payer: PPO | Admitting: Family Medicine

## 2019-09-24 VITALS — BP 100/60 | HR 62 | Temp 97.7°F | Ht 74.0 in | Wt 228.3 lb

## 2019-09-24 DIAGNOSIS — E559 Vitamin D deficiency, unspecified: Secondary | ICD-10-CM | POA: Insufficient documentation

## 2019-09-24 DIAGNOSIS — G3184 Mild cognitive impairment, so stated: Secondary | ICD-10-CM | POA: Diagnosis not present

## 2019-09-24 MED ORDER — DONEPEZIL HCL 5 MG PO TABS: 5.0000 mg | ORAL_TABLET | Freq: Every day | ORAL | 1 refills | Status: DC

## 2019-09-24 NOTE — Patient Instructions (Addendum)
You are having some issues with short term memory  I call this mild cognitive impairment   Staying hydrated helps a lot  Also exercise  Also regular meals  Socialization/ reading /math-keep your brain active   Take a look for the book called Keep [place] Mody (by Selmer Dominion)  Start the generic aricept 5 mg daily at bedtime  In 2-4 weeks if you are doing well (no side effects like nausea) let me know and we will increase dose to 10 mg

## 2019-09-24 NOTE — Progress Notes (Signed)
Subjective:    Patient ID: Troy [name], male    DOB: 04/13/1946, 74 y.o.   MRN: [id number]  This visit occurred during the SARS-CoV-2 public health emergency.  Safety protocols were in place, including screening questions prior to the visit, additional usage of staff PPE, and extensive cleaning of exam room while observing appropriate contact time as indicated for disinfecting solutions.    HPI Pt presents for f/u of memory/cognitive changes   Wt Readings from Last 3 Encounters:  09/24/19 228 lb 4.8 oz (103.6 kg)  09/14/19 227 lb 2 oz (103 kg)  04/16/19 230 lb (104.3 kg)   29.31 kg/m   Last visit labs were done  Disc need for proper hydration Results for orders placed or performed in visit on 09/14/19  Urine Culture   Specimen: Urine  Result Value Ref Range   MICRO NUMBER: [id number]    SPECIMEN QUALITY: Adequate    Sample Source NOT GIVEN    STATUS: FINAL    Result: No Growth   CBC with Differential/Platelet  Result Value Ref Range   WBC 6.2 4.0 - 10.5 K/uL   RBC 4.49 4.22 - 5.81 Mil/uL   Hemoglobin 14.3 13.0 - 17.0 g/dL   HCT 55.7 32.2 - 02.5 %   MCV 93.1 78.0 - 100.0 fl   MCHC 34.1 30.0 - 36.0 g/dL   RDW 42.7 06.2 - 37.6 %   Platelets 228.0 150.0 - 400.0 K/uL   Neutrophils Relative % 78.6 (H) 43.0 - 77.0 %   Lymphocytes Relative 13.0 12.0 - 46.0 %   Monocytes Relative 7.5 3.0 - 12.0 %   Eosinophils Relative 0.5 0.0 - 5.0 %   Basophils Relative 0.4 0.0 - 3.0 %   Neutro Abs 4.9 1.4 - 7.7 K/uL   Lymphs Abs 0.8 0.7 - 4.0 K/uL   Monocytes Absolute 0.5 0.1 - 1.0 K/uL   Eosinophils Absolute 0.0 0.0 - 0.7 K/uL   Basophils Absolute 0.0 0.0 - 0.1 K/uL  Comprehensive metabolic panel  Result Value Ref Range   Sodium 141 135 - 145 mEq/L   Potassium 4.5 3.5 - 5.1 mEq/L   Chloride 104 96 - 112 mEq/L   CO2 33 (H) 19 - 32 mEq/L   Glucose, Bld 92 70 - 99 mg/dL   BUN 21 6 - 23 mg/dL   Creatinine, Ser 2.83 0.40 - 1.50 mg/dL   Total Bilirubin 0.8 0.2 - 1.2 mg/dL   Alkaline  Phosphatase 43 39 - 117 U/L   AST 26 0 - 37 U/L   ALT 24 0 - 53 U/L   Total Protein 6.2 6.0 - 8.3 g/dL   Albumin 4.2 3.5 - 5.2 g/dL   GFR 15.17 >61.60 mL/min   Calcium 9.4 8.4 - 10.5 mg/dL  TSH  Result Value Ref Range   TSH 1.06 0.35 - 4.50 uIU/mL  Vitamin B12  Result Value Ref Range   Vitamin B-12 300 211 - 911 pg/mL  VITAMIN D 25 Hydroxy (Vit-D Deficiency, Fractures)  Result Value Ref Range   VITD 27.17 (L) 30.00 - 100.00 ng/mL  Hemoglobin A1c  Result Value Ref Range   Hgb A1c MFr Bld 5.5 4.6 - 6.5 %  POCT Urinalysis Dipstick (Automated)  Result Value Ref Range   Color, UA Dark Yellow    Clarity, UA Clear    Glucose, UA Negative Negative   Bilirubin, UA Negative    Ketones, UA 5 mg/dL    Spec Grav, UA >=7.371 (A) 1.010 - 1.025  Blood, UA 200 Ery/uL    pH, UA 5.5 5.0 - 8.0   Protein, UA Positive (A) Negative   Urobilinogen, UA 0.2 0.2 or 1.0 E.U./dL   Nitrite, UA Negative    Leukocytes, UA Negative Negative    Taking in a lot more fluids It has made a difference -feels better   Got some vit D -taking 5000 iu daily   Patient Active Problem List   Diagnosis Date Noted  . Vitamin D deficiency 09/24/2019  . Rheumatoid arthritis ([place]) 09/14/2019  . Hematuria 09/14/2019  . Mild cognitive impairment 09/14/2019  . Mild dehydration 09/14/2019  . Positive ANA (antinuclear antibody) 03/21/2019  . Joint pain 03/16/2019  . Bilateral hand swelling 03/11/2019  . Weakness of both lower extremities 03/11/2019  . Hyperkalemia 01/09/2017  . Benign paroxysmal positional vertigo 02/16/2014  . Routine general medical examination at a health care facility 12/10/2013  . Family history of colon cancer 12/10/2013  . Prostate cancer screening 12/05/2013  . Allergic rhinitis 10/18/2013  . ROTATOR CUFF INJURY, LEFT SHOULDER 04/30/2010  . Prediabetes 08/21/2009  . HYPERLIPIDEMIA 04/20/2007  . Essential hypertension 04/20/2007  . OSTEOARTHRITIS 02/09/2007   History reviewed. No  pertinent past medical history. Past Surgical History:  Procedure Laterality Date  . BACK SURGERY  04/1998  . LIPOMA EXCISION  09/2009  . ROTATOR CUFF REPAIR Left 2012  . TONSILLECTOMY AND ADENOIDECTOMY  age 19   Social History   Tobacco Use  . Smoking status: Never Smoker  . Smokeless tobacco: Never Used  Substance Use Topics  . Alcohol use: No    Comment: quit over 39 years ago  . Drug use: No   Family History  Problem Relation Age of Onset  . Dementia Mother   . Colon cancer Father 41   Allergies  Allergen Reactions  . Chlorzoxazone     REACTION: dizzy  . Sulfur Rash   Current Outpatient Medications on File Prior to Visit  Medication Sig Dispense Refill  . Acetaminophen (TYLENOL ARTHRITIS PAIN PO) Take by mouth.    . baricitinib (OLUMIANT) 2 MG TABS tablet Take 2 mg by mouth daily.    Kendall Flack 575 MG/5ML SYRP Take by mouth.    . folic acid (FOLVITE) 1 MG tablet Take 1 mg by mouth daily.    . methotrexate (RHEUMATREX) 2.5 MG tablet Take 8 tablets by mouth once a week.    . predniSONE (DELTASONE) 5 MG tablet Take 2.5 mg by mouth daily.     No current facility-administered medications on file prior to visit.    Review of Systems  Constitutional: Negative for activity change, appetite change, fatigue, fever and unexpected weight change.  HENT: Negative for congestion, rhinorrhea, sore throat and trouble swallowing.   Eyes: Negative for pain, redness, itching and visual disturbance.  Respiratory: Negative for cough, chest tightness, shortness of breath and wheezing.   Cardiovascular: Negative for chest pain and palpitations.  Gastrointestinal: Negative for abdominal pain, blood in stool, constipation, diarrhea and nausea.  Endocrine: Negative for cold intolerance, heat intolerance, polydipsia and polyuria.  Genitourinary: Negative for difficulty urinating, dysuria, frequency and urgency.  Musculoskeletal: Positive for arthralgias. Negative for joint swelling and  myalgias.  Skin: Negative for pallor and rash.  Neurological: Negative for dizziness, tremors, weakness, numbness and headaches.  Hematological: Negative for adenopathy. Does not bruise/bleed easily.  Psychiatric/Behavioral: Positive for decreased concentration. Negative for confusion and dysphoric mood. The patient is not nervous/anxious.        Short term memory  problems        Objective:   Physical Exam Constitutional:      General: He is not in acute distress.    Appearance: Normal appearance. He is well-developed. He is obese. He is not ill-appearing or diaphoretic.  HENT:     Head: Normocephalic and atraumatic.  Eyes:     General: No scleral icterus.    Conjunctiva/sclera: Conjunctivae normal.     Pupils: Pupils are equal, round, and reactive to light.  Neck:     Thyroid: No thyromegaly.     Vascular: No carotid bruit or JVD.  Cardiovascular:     Rate and Rhythm: Normal rate and regular rhythm.     Heart sounds: Normal heart sounds. No gallop.   Pulmonary:     Effort: Pulmonary effort is normal. No respiratory distress.     Breath sounds: Normal breath sounds. No wheezing or rales.  Abdominal:     General: Bowel sounds are normal. There is no distension or abdominal bruit.     Palpations: Abdomen is soft. There is no mass.     Tenderness: There is no abdominal tenderness.  Musculoskeletal:     Cervical back: Normal range of motion and neck supple.     Right lower leg: No edema.     Left lower leg: No edema.  Lymphadenopathy:     Cervical: No cervical adenopathy.  Skin:    General: Skin is warm and dry.     Findings: No rash.  Neurological:     General: No focal deficit present.     Mental Status: He is alert.     Cranial Nerves: No cranial nerve deficit.     Sensory: No sensory deficit.     Motor: No weakness.     Coordination: Coordination normal.     Deep Tendon Reflexes: Reflexes are normal and symmetric.     Comments: MMS scrore 26 out of 30 (primarily  memory deficit) Nl clock draw   Psychiatric:        Mood and Affect: Mood normal.        Behavior: Behavior normal.        Thought Content: Thought content normal.        Cognition and Memory: Cognition is not impaired. Memory is impaired. He exhibits impaired recent memory.           Assessment & Plan:   Problem List Items Addressed This Visit      Other   Mild cognitive impairment - Primary    Manifesting as purely short term memory loss (no confusion) Some improvement with better hydration since last visit  Reviewed labs in detail  MMS exam score is 26 today (memory questions were missed) Disc MCI and expectations for progression in the future Agreed on trial of generic aricept 5 mg qhs (to inc to 10 mg in 2-4 wk after update)  Pt and wife aware this will slow progression only  Enc exercise/brain activity/socialization/good diet and fluid intake  Recommended pt education  Disc safety in detail- esp in form of reminders and notes  Wife will monitor carefully and update [place] with any safety concerns      Vitamin D deficiency    D level 27.1  Started 5000 iu daily  Hopefully this will help symptoms  Continue to monitor

## 2019-09-25 NOTE — Assessment & Plan Note (Addendum)
Manifesting as purely short term memory loss (no confusion) Some improvement with better hydration since last visit  Reviewed labs in detail  MMS exam score is 26 today (memory questions were missed) Disc MCI and expectations for progression in the future Agreed on trial of generic aricept 5 mg qhs (to inc to 10 mg in 2-4 wk after update)  Pt and wife aware this will slow progression only  Enc exercise/brain activity/socialization/good diet and fluid intake  Recommended pt education  Disc safety in detail- esp in form of reminders and notes  Wife will monitor carefully and update [place] with any safety concerns

## 2019-09-25 NOTE — Assessment & Plan Note (Signed)
D level 27.1  Started 5000 iu daily  Hopefully this will help symptoms  Continue to monitor

## 2019-10-15 ENCOUNTER — Telehealth: Payer: Self-pay

## 2019-10-15 NOTE — Telephone Encounter (Signed)
I spoke with pts wife (DPR signed) thinks pt is having side effects to donepezil 5 mg taking at hs. Pt is having hot flashes. Pt sleeps in underwear due to feeling hot but no fever, pt not sleeping well due to needing fresh air; pt does not feel bad and not having difficulty breathing but due to pt feeling hot. Pt is working and feels OK. Pt was cold natured prior to starting the donepezil mid May 2021. No CP,SOB,H/A or dizziness. Mrs Joynt wants to know if pt should continue the Donepezil or is there a different med pt could try. Request cb. [facility]. Pt last visit 09/24/19.

## 2019-10-15 NOTE — Telephone Encounter (Signed)
Wife notified of Dr. Royden Purl instructions and verbalized understanding. She will have pt stop med for a week and then update [place] on how he's doing

## 2019-10-15 NOTE — Telephone Encounter (Signed)
It can be a side effect (considered very rare)  Stop the medicine and let me know if symptoms do not improve in the next week (if they continue please check temperature to rule out fever when it happens)  Let me know how this goes and then we will make a plan

## 2019-10-16 ENCOUNTER — Other Ambulatory Visit: Payer: Self-pay | Admitting: Family Medicine

## 2019-10-18 NOTE — Telephone Encounter (Signed)
Pt is having side eff to med. This was an auto refill, pt isn't taking med. Refill request declined and wife aware

## 2019-10-19 DIAGNOSIS — M130 Polyarthritis, unspecified: Secondary | ICD-10-CM | POA: Diagnosis not present

## 2019-10-19 DIAGNOSIS — Z79899 Other long term (current) drug therapy: Secondary | ICD-10-CM | POA: Diagnosis not present

## 2019-10-19 DIAGNOSIS — R768 Other specified abnormal immunological findings in serum: Secondary | ICD-10-CM | POA: Diagnosis not present

## 2019-10-19 DIAGNOSIS — M25579 Pain in unspecified ankle and joints of unspecified foot: Secondary | ICD-10-CM | POA: Diagnosis not present

## 2019-10-19 DIAGNOSIS — M1990 Unspecified osteoarthritis, unspecified site: Secondary | ICD-10-CM | POA: Diagnosis not present

## 2019-10-19 DIAGNOSIS — G3184 Mild cognitive impairment, so stated: Secondary | ICD-10-CM | POA: Diagnosis not present

## 2019-10-19 DIAGNOSIS — M7989 Other specified soft tissue disorders: Secondary | ICD-10-CM | POA: Diagnosis not present

## 2019-10-19 DIAGNOSIS — M0609 Rheumatoid arthritis without rheumatoid factor, multiple sites: Secondary | ICD-10-CM | POA: Diagnosis not present

## 2019-10-19 DIAGNOSIS — M79643 Pain in unspecified hand: Secondary | ICD-10-CM | POA: Diagnosis not present

## 2019-10-21 DIAGNOSIS — M0609 Rheumatoid arthritis without rheumatoid factor, multiple sites: Secondary | ICD-10-CM | POA: Diagnosis not present

## 2019-10-29 NOTE — Telephone Encounter (Signed)
Please add aricept to intolerance list (unless already on it)   For constipation  Use miralax as directed three times daily until bms start getting more regular (this will take several days most likely) and then titrate the miralax as needed  Can also try a dose of milk of magnesia  Drink lots of fluids Keep me updated

## 2019-10-29 NOTE — Telephone Encounter (Signed)
Notified Pt's wife (DPR signed) of Dr. Lucretia Roers suggestions for the constipation. She understood and had no other questions/concerns.

## 2019-10-29 NOTE — Telephone Encounter (Signed)
pts wife (DPR signed) calling with update since stopping Aricept; pt is sleeping better; pts wife not sure how many hrs per night sleeping now but she knows he is doing better since stopped Aricept. She cannot ask pt because he is at work.No more hot flashes or wandering around.  Pt wants med for constipation. Pt's wife said about 1 wk ago tried OTC med (does not know name of med) and it really did not help. Pt having small constipated stools. Not sure when last normal BM was. No abd pain or swelling. [facility]. pts wife request cb after Dr Milinda Antis reviews note.

## 2019-11-04 ENCOUNTER — Ambulatory Visit (INDEPENDENT_AMBULATORY_CARE_PROVIDER_SITE_OTHER): Payer: No Typology Code available for payment source

## 2019-11-04 ENCOUNTER — Other Ambulatory Visit: Payer: Self-pay

## 2019-11-04 ENCOUNTER — Ambulatory Visit (INDEPENDENT_AMBULATORY_CARE_PROVIDER_SITE_OTHER): Payer: No Typology Code available for payment source | Admitting: Internal Medicine

## 2019-11-04 ENCOUNTER — Encounter: Payer: Self-pay | Admitting: Internal Medicine

## 2019-11-04 DIAGNOSIS — R9389 Abnormal findings on diagnostic imaging of other specified body structures: Secondary | ICD-10-CM

## 2019-11-04 NOTE — Progress Notes (Signed)
Troy [name], male    DOB: [birth year],    MRN: [id number]   Brief patient profile:  21 yowm never smoker [place] veteran x one year July 67-68 with exp to agent orange but no apparent sequelae and new onset arthritis Fall of 2020 x as RA by [facility]  place on mtx/pred and "investigational drug" Nov  2020 and arthritis improved but told needed pft after cxr showed "emphysema" and PFT could not be done by [place] so referred to pulmonary clinic 11/04/2019 by [place] in [place]     History of Present Illness  11/04/2019  Pulmonary/ 1st office eval/Cathan Gearin  S/p both vaccines for COVID 19  Chief Complaint  Patient presents with   Pulmonary Consult    [facility]. Pt states that the [place] had rec that he have PFT done. He states that he does not have any SOB.   Dyspnea:  [place] parts including up to 50 lb / some slt hills around neighborhood, not doing full flight of steps on any regular basis but Not limited by breathing from desired activities   Cough: no  Sleep: recliner x 45 degrees x few months - "more comfortable"  SABA use: none   No obvious day to day or daytime variability or assoc excess/ purulent sputum or mucus plugs or hemoptysis or cp or chest tightness, subjective wheeze or overt sinus or hb symptoms.   Sleeping as above  without nocturnal  or early am exacerbation  of respiratory  c/o's or need for noct saba. Also denies any obvious fluctuation of symptoms with weather or environmental changes or other aggravating or alleviating factors except as outlined above   No unusual exposure hx or h/o childhood pna/ asthma or knowledge of premature birth.  Current Allergies, Complete Past Medical History, Past Surgical History, Family History, and Social History were reviewed in [employer] record.  ROS  The following are not active complaints unless bolded Hoarseness, sore throat, dysphagia, dental problems, itching, sneezing,  nasal congestion or discharge of excess mucus or  purulent secretions, ear ache,   fever, chills, sweats, unintended wt loss or wt gain, classically pleuritic or exertional cp, ?  orthopnea pnd or arm/hand swelling  or leg swelling, presyncope, palpitations, abdominal pain, anorexia, nausea, vomiting, diarrhea  or change in bowel habits or change in bladder habits, change in stools or change in urine, dysuria, hematuria,  rash, arthralgias, visual complaints, headache, numbness, weakness or ataxia or problems with walking or coordination,  change in mood or  memory.            No past medical history on file.  Outpatient Medications Prior to Visit  Medication Sig Dispense Refill   Acetaminophen (TYLENOL ARTHRITIS PAIN PO) Take by mouth.     baricitinib (OLUMIANT) 2 MG TABS tablet Take 2 mg by mouth daily.     Elderberry 575 MG/5ML SYRP Take by mouth.     folic acid (FOLVITE) 1 MG tablet Take 1 mg by mouth daily.     methotrexate (RHEUMATREX) 2.5 MG tablet Take 8 tablets by mouth once a week.     predniSONE (DELTASONE) 5 MG tablet Take 2.5 mg by mouth daily.     donepezil (ARICEPT) 5 MG tablet Take 1 tablet (5 mg total) by mouth at bedtime. 30 tablet 1         Objective:     BP 120/60 (BP Location: Left Arm, Cuff Size: Normal)    Pulse (!) 50  Temp 97.7 F (36.5 C) (Temporal)    Ht 6\' 4"  (1.93 m)    Wt 222 lb 3.2 oz (100.8 kg)    SpO2 98% Comment: on RA   BMI 27.05 kg/m   SpO2: 98 % (on RA)   Pleasant amb wm nad   HEENT : pt wearing mask not removed for exam due to covid -19 concerns.    NECK :  without JVD/Nodes/TM/ nl carotid upstrokes bilaterally   LUNGS: no acc muscle use,  Nl contour chest which is clear to A and P bilaterally without cough on insp or exp maneuvers   CV:  RRR  no s3 or murmur or increase in P2, and no edema   ABD:  soft and nontender with nl inspiratory excursion in the supine position. No bruits or organomegaly appreciated, bowel sounds nl  MS:  Nl gait/ ext warm without deformities, calf  tenderness, cyanosis or clubbing No obvious joint restrictions / no obvious RA changes   SKIN: warm and dry without lesions    NEURO:  alert, approp, nl sensorium with  no motor or cerebellar deficits apparent.      CXR PA and Lateral:   11/04/2019 :    I personally reviewed images and  impression as follows:   Minimal nonspecific increased markings       Assessment   Abnormal CXR Dx RA Oct 2020 rx mtx/ pred and olumiant  -  11/04/2019   Walked RA  2 laps @ approx 275ft each @ mod fast  pace  stopped due to end of study lowest sat 97% and no sob   No evidence of significant RA lung dz either of airways or parenchyma or adverse effects of RA meds to date.   Will set up for f/u pfts and repeat them at rheum recs but best way to follow advised: Make sure you check your oxygen saturations at highest level of activity to be sure it stays over 90% and maint as much submax exercise as tolerated to maintain conditioning/ mobility.         Each maintenance medication was reviewed in detail including emphasizing most importantly the difference between maintenance and prns and under what circumstances the prns are to be triggered using an action plan format where appropriate.  Total time for H and P, chart review, counseling,  directly observing portions of ambulatory 02 saturation study/ and generating customized AVS unique to this office visit / charting = 45 min           Sandrea Hughs, MD 11/04/2019

## 2019-11-04 NOTE — Patient Instructions (Signed)
Make sure you check your oxygen saturations at highest level of activity to be sure it stays over 90%  Let me know if the trending down  We will schedule a follow up PFT next available and I will call you with the result  Please remember to go to the  x-ray department  for your tests - we will call you with the results when they are available    Pulmonary follow up is as needed or if your rheumatologist recommends it

## 2019-11-05 ENCOUNTER — Encounter: Payer: Self-pay | Admitting: Internal Medicine

## 2019-11-05 NOTE — Assessment & Plan Note (Signed)
Dx RA Oct 2020 rx mtx/ pred and olumiant  -  11/04/2019   Walked RA  2 laps @ approx 256ft each @ mod fast  pace  stopped due to end of study lowest sat 97% and no sob   No evidence of significant RA lung dz either of airways or parenchyma or adverse effects of RA meds to date.   Will set up for f/u pfts and repeat them at rheum recs but best way to follow advised: Make sure you check your oxygen saturations at highest level of activity to be sure it stays over 90% and maint as much submax exercise as tolerated to maintain conditioning/ mobility.         Each maintenance medication was reviewed in detail including emphasizing most importantly the difference between maintenance and prns and under what circumstances the prns are to be triggered using an action plan format where appropriate.  Total time for H and P, chart review, counseling,  directly observing portions of ambulatory 02 saturation study/ and generating customized AVS unique to this office visit / charting = 45 min

## 2019-11-08 ENCOUNTER — Telehealth: Payer: Self-pay | Admitting: Internal Medicine

## 2019-11-08 NOTE — Progress Notes (Signed)
LMTCB

## 2019-11-08 NOTE — Telephone Encounter (Signed)
Spoke with patient's wife, Stanton Kidney (listed on DPR) and provided cxr results.  Nothing further needed.

## 2019-11-08 NOTE — Telephone Encounter (Signed)
Placed notes in the to fax folder. Will close encounter.

## 2019-11-22 ENCOUNTER — Telehealth: Payer: Self-pay

## 2019-11-22 NOTE — Telephone Encounter (Signed)
pts wife (DPR signed) left v/m that pt is not resting well at night due to getting up several times during the night to urinate. I left v/m(per DPR; second pg of demographics on pts chart) requesting pts wife to cb to [facility] to schedule appt for frequent urination at nighttime and pt or pts wife can cb with any questions or concerns also. FYI to [place] at front desk.

## 2019-11-22 NOTE — Telephone Encounter (Signed)
Pt called back and scheduled appt for tomorrow

## 2019-11-23 ENCOUNTER — Encounter: Payer: Self-pay | Admitting: Family Medicine

## 2019-11-23 ENCOUNTER — Other Ambulatory Visit: Payer: Self-pay

## 2019-11-23 ENCOUNTER — Ambulatory Visit (INDEPENDENT_AMBULATORY_CARE_PROVIDER_SITE_OTHER): Payer: No Typology Code available for payment source | Admitting: Family Medicine

## 2019-11-23 VITALS — BP 114/68 | HR 61 | Temp 96.8°F | Ht 74.0 in | Wt 219.2 lb

## 2019-11-23 DIAGNOSIS — N401 Enlarged prostate with lower urinary tract symptoms: Secondary | ICD-10-CM

## 2019-11-23 DIAGNOSIS — S91209A Unspecified open wound of unspecified toe(s) with damage to nail, initial encounter: Secondary | ICD-10-CM | POA: Diagnosis not present

## 2019-11-23 DIAGNOSIS — I10 Essential (primary) hypertension: Secondary | ICD-10-CM

## 2019-11-23 DIAGNOSIS — M0609 Rheumatoid arthritis without rheumatoid factor, multiple sites: Secondary | ICD-10-CM

## 2019-11-23 DIAGNOSIS — R350 Frequency of micturition: Secondary | ICD-10-CM | POA: Diagnosis not present

## 2019-11-23 DIAGNOSIS — K5900 Constipation, unspecified: Secondary | ICD-10-CM | POA: Diagnosis not present

## 2019-11-23 DIAGNOSIS — R3129 Other microscopic hematuria: Secondary | ICD-10-CM | POA: Insufficient documentation

## 2019-11-23 DIAGNOSIS — R8290 Unspecified abnormal findings in urine: Secondary | ICD-10-CM

## 2019-11-23 DIAGNOSIS — N400 Enlarged prostate without lower urinary tract symptoms: Secondary | ICD-10-CM | POA: Insufficient documentation

## 2019-11-23 LAB — POC URINALSYSI DIPSTICK (AUTOMATED)
Bilirubin, UA: NEGATIVE
Blood, UA: 25
Glucose, UA: NEGATIVE
Ketones, UA: NEGATIVE
Leukocytes, UA: NEGATIVE
Nitrite, UA: NEGATIVE
Protein, UA: NEGATIVE
Spec Grav, UA: 1.015 (ref 1.010–1.025)
Urobilinogen, UA: 0.2 E.U./dL
pH, UA: 6 (ref 5.0–8.0)

## 2019-11-23 NOTE — Patient Instructions (Addendum)
Use mirlax three times daily until bowel movements are easier  Then you can determine how much you need it  Drink fluids  Eat fruits and veggies  A stool softener is ok to use with miralax   Let's get a urine sample when we can   I think you have enlarged prostate  I will place a referral to urology - and the office will call to set that up   The toe nail will come off by itself  Protect it until then

## 2019-11-23 NOTE — Assessment & Plan Note (Signed)
Some frequency No dysuria  Sent for cx  Ref made to urology

## 2019-11-23 NOTE — Progress Notes (Signed)
Subjective:    Patient ID: Troy [name], male    DOB: [birth year], 74 y.o.   MRN: [id number]  This visit occurred during the SARS-CoV-2 public health emergency.  Safety protocols were in place, including screening questions prior to the visit, additional usage of staff PPE, and extensive cleaning of exam room while observing appropriate contact time as indicated for disinfecting solutions.    HPI Pt presents with urinary symptoms and L great toe trauma recently  Wife called to let us know he gets up several times at night ot urinate   Wt Readings from Last 3 Encounters:  11/23/19 219 lb 4 oz (99.5 kg)  11/04/19 222 lb 3.2 oz (100.8 kg)  09/24/19 228 lb 4.8 oz (103.6 kg)   28.15 kg/m   He does have h/o BPH  Going on a while  Worse for a month  Wife tries to stop him from drinking after 9 pm   occ light headed -Sunday had had to leave church   No burning to urinate  No change in color or odor -normal for him  No blood   Does not always feel like he empties all the time   ua today: Microscopic blood noted Results for orders placed or performed in visit on 11/23/19  POCT Urinalysis Dipstick (Automated)  Result Value Ref Range   Color, UA Yellow    Clarity, UA Clear    Glucose, UA Negative Negative   Bilirubin, UA Negative    Ketones, UA Negative    Spec Grav, UA 1.015 1.010 - 1.025   Blood, UA 25 Ery/uL    pH, UA 6.0 5.0 - 8.0   Protein, UA Negative Negative   Urobilinogen, UA 0.2 0.2 or 1.0 E.U./dL   Nitrite, UA Negative    Leukocytes, UA Negative Negative      No bladder pain  Is having some constipation   Has a BM - perhaps every 2-4 days  Hard little balls   Wife gave him dulcolax- and it helped a little  She tried miralax    Lab Results  Component Value Date   PSA 1.65 01/01/2017   PSA 1.18 12/06/2013   PSA 1.91 07/19/2009   Family hx   Also prediabetes Lab Results  Component Value Date   HGBA1C 5.5 09/14/2019   Has been on prednisone   2.5 mg daily  Hopes to come off of soon  Joint pain is improved   In the past, dehydration   Had microscopic blood on ua in may -that urine cx was neg  (he was seemingly dehydrated then)   Lab Results  Component Value Date   CREATININE 0.91 09/14/2019   BUN 21 09/14/2019   NA 141 09/14/2019   K 4.5 09/14/2019   CL 104 09/14/2019   CO2 33 (H) 09/14/2019   BP Readings from Last 3 Encounters:  11/23/19 114/68  11/04/19 120/60  09/24/19 100/60   Pulse Readings from Last 3 Encounters:  11/23/19 61  11/04/19 (!) 50  09/24/19 62   Patient Active Problem List   Diagnosis Date Noted  . Frequent urination 11/23/2019  . Constipation 11/23/2019  . Traumatic avulsion of nail plate of toe 91/47/8295  . BPH (benign prostatic hyperplasia) 11/23/2019  . Microscopic hematuria 11/23/2019  . Abnormal CXR 11/04/2019  . Vitamin D deficiency 09/24/2019  . Rheumatoid arthritis (HCC) 09/14/2019  . Hematuria 09/14/2019  . Mild cognitive impairment 09/14/2019  . Mild dehydration 09/14/2019  . Positive ANA (antinuclear antibody) 03/21/2019  .  Joint pain 03/16/2019  . Bilateral hand swelling 03/11/2019  . Weakness of both lower extremities 03/11/2019  . Hyperkalemia 01/09/2017  . Benign paroxysmal positional vertigo 02/16/2014  . Routine general medical examination at a health care facility 12/10/2013  . Family history of colon cancer 12/10/2013  . Prostate cancer screening 12/05/2013  . Allergic rhinitis 10/18/2013  . ROTATOR CUFF INJURY, LEFT SHOULDER 04/30/2010  . Prediabetes 08/21/2009  . HYPERLIPIDEMIA 04/20/2007  . OSTEOARTHRITIS 02/09/2007   History reviewed. No pertinent past medical history. Past Surgical History:  Procedure Laterality Date  . BACK SURGERY  04/1998  . LIPOMA EXCISION  09/2009  . ROTATOR CUFF REPAIR Left 2012  . TONSILLECTOMY AND ADENOIDECTOMY  age 78   Social History   Tobacco Use  . Smoking status: Never Smoker  . Smokeless tobacco: Never Used   Substance Use Topics  . Alcohol use: No    Comment: quit over 39 years ago  . Drug use: No   Family History  Problem Relation Age of Onset  . Dementia Mother   . Colon cancer Father 29   Allergies  Allergen Reactions  . Chlorzoxazone     REACTION: dizzy  . Donepezil   . Sulfur Rash   Current Outpatient Medications on File Prior to Visit  Medication Sig Dispense Refill  . Acetaminophen (TYLENOL ARTHRITIS PAIN PO) Take by mouth.    . baricitinib (OLUMIANT) 2 MG TABS tablet Take 2 mg by mouth daily.    [name] 575 MG/5ML SYRP Take by mouth.    . folic acid (FOLVITE) 1 MG tablet Take 1 mg by mouth daily.    . methotrexate (RHEUMATREX) 2.5 MG tablet Take 8 tablets by mouth once a week.    . predniSONE (DELTASONE) 5 MG tablet Take 2.5 mg by mouth daily.     No current facility-administered medications on file prior to visit.      Review of Systems  Constitutional: Negative for activity change, appetite change, fatigue, fever and unexpected weight change.  HENT: Negative for congestion, rhinorrhea, sore throat and trouble swallowing.   Eyes: Negative for pain, redness, itching and visual disturbance.  Respiratory: Negative for cough, chest tightness, shortness of breath and wheezing.   Cardiovascular: Negative for chest pain and palpitations.  Gastrointestinal: Positive for constipation. Negative for abdominal distention, abdominal pain, anal bleeding, blood in stool, diarrhea, nausea and rectal pain.  Endocrine: Negative for cold intolerance, heat intolerance, polydipsia and polyuria.  Genitourinary: Positive for difficulty urinating and frequency. Negative for decreased urine volume, dysuria, enuresis, flank pain, hematuria, testicular pain and urgency.  Musculoskeletal: Positive for arthralgias. Negative for joint swelling and myalgias.  Skin: Negative for pallor and rash.  Neurological: Negative for dizziness, tremors, weakness, numbness and headaches.  Hematological:  Negative for adenopathy. Does not bruise/bleed easily.  Psychiatric/Behavioral: Negative for decreased concentration and dysphoric mood. The patient is not nervous/anxious.        Short term memory issues persist       Objective:   Physical Exam Constitutional:      General: He is not in acute distress.    Appearance: Normal appearance. He is well-developed and normal weight. He is not ill-appearing or diaphoretic.  HENT:     Head: Normocephalic and atraumatic.     Mouth/Throat:     Mouth: Mucous membranes are moist.  Eyes:     General: No scleral icterus.    Conjunctiva/sclera: Conjunctivae normal.     Pupils: Pupils are equal, round, and reactive to  light.  Neck:     Thyroid: No thyromegaly.     Vascular: No carotid bruit or JVD.  Cardiovascular:     Rate and Rhythm: Normal rate and regular rhythm.     Heart sounds: Normal heart sounds. No gallop.   Pulmonary:     Effort: Pulmonary effort is normal. No respiratory distress.     Breath sounds: Normal breath sounds. No wheezing or rales.  Abdominal:     General: Bowel sounds are normal. There is no distension or abdominal bruit.     Palpations: Abdomen is soft. There is no mass.     Tenderness: There is no abdominal tenderness. There is no right CVA tenderness, left CVA tenderness, guarding or rebound.     Comments: No suprapubic tenderness or fullness    Musculoskeletal:     Cervical back: Normal range of motion and neck supple.     Right lower leg: No edema.     Left lower leg: No edema.  Lymphadenopathy:     Cervical: No cervical adenopathy.  Skin:    General: Skin is warm and dry.     Findings: No rash.     Comments: L great toe nail is loose at the base Non tender  No skin change  No signs of infection   Neurological:     Mental Status: He is alert.     Sensory: No sensory deficit.     Coordination: Coordination normal.     Deep Tendon Reflexes: Reflexes are normal and symmetric. Reflexes normal.  Psychiatric:         Mood and Affect: Mood normal.        Cognition and Memory: He exhibits impaired recent memory.     Comments: Baseline short term memory issues-occ repeats himself            Assessment & Plan:   Problem List Items Addressed This Visit      Cardiovascular and Mediastinum   RESOLVED: Essential hypertension    bp has been trending low w/o treatment  Will resolve this        Musculoskeletal and Integument   Rheumatoid arthritis (HCC)    Pt currently takes mtx and baricitinib and low dose prednisone  Hopes to get off prednisone soon (which may add to frequent urination)       Traumatic avulsion of nail plate of toe    Pt will likely loose L great toe nail soon -very loose  Will wrap with gauze to keep stable in shoe until it goes  No s/s of infection and no pain  Good foot care Will let us know if any changes        Genitourinary   BPH (benign prostatic hyperplasia)    I suspect this is main cause of frequent urination/also at night  Micro blood in ua - will culture as well  Hesitant to try flomax due to baseline low bp and occ light headedness   Will ref to urology for formal eval/exam of prostate and microscopic hematuria   Lab Results  Component Value Date   PSA 1.65 01/01/2017   PSA 1.18 12/06/2013   PSA 1.91 07/19/2009          Relevant Orders   Ambulatory referral to Urology   Microscopic hematuria    Some frequency No dysuria  Sent for cx  Ref made to urology        Relevant Orders   Ambulatory referral to Urology  Other   Frequent urination - Primary    Worse at night With hesitancy  Expect from BPH  Also constipation and recent prednisone worsens it   ua with microscopic blood (pend cx) Ref to urology for eval /tx  Hesitant to try flomax with baseline low bp and light headedness  Will work on the constipation problem       Relevant Orders   POCT Urinalysis Dipstick (Automated) (Completed)   Urine Culture   Ambulatory  referral to Urology   Constipation    This may worsen his urinary issues Enc better water intake Also fruit/veggies Handout given-fiber  Will start miralax otc TID until better bm and then titrate to need Also stool softener (colace)  Update if no improvement       Other Visit Diagnoses    Abnormal urinalysis       Relevant Orders   Urine Culture

## 2019-11-23 NOTE — Assessment & Plan Note (Signed)
bp has been trending low w/o treatment  Will resolve this

## 2019-11-23 NOTE — Assessment & Plan Note (Signed)
This may worsen his urinary issues Enc better water intake Also fruit/veggies Handout given-fiber  Will start miralax otc TID until better bm and then titrate to need Also stool softener (colace)  Update if no improvement

## 2019-11-23 NOTE — Assessment & Plan Note (Addendum)
I suspect this is main cause of frequent urination/also at night  Micro blood in ua - will culture as well  Hesitant to try flomax due to baseline low bp and occ light headedness   Will ref to urology for formal eval/exam of prostate and microscopic hematuria   Lab Results  Component Value Date   PSA 1.65 01/01/2017   PSA 1.18 12/06/2013   PSA 1.91 07/19/2009

## 2019-11-23 NOTE — Assessment & Plan Note (Signed)
Pt currently takes mtx and baricitinib and low dose prednisone  Hopes to get off prednisone soon (which may add to frequent urination)

## 2019-11-23 NOTE — Assessment & Plan Note (Signed)
Worse at night With hesitancy  Expect from BPH  Also constipation and recent prednisone worsens it   ua with microscopic blood (pend cx) Ref to urology for eval /tx  Hesitant to try flomax with baseline low bp and light headedness  Will work on the constipation problem

## 2019-11-23 NOTE — Assessment & Plan Note (Signed)
Pt will likely loose L great toe nail soon -very loose  Will wrap with gauze to keep stable in shoe until it goes  No s/s of infection and no pain  Good foot care Will let us know if any changes

## 2019-11-24 LAB — URINE CULTURE
MICRO NUMBER:: 10698757
Result:: NO GROWTH
SPECIMEN QUALITY:: ADEQUATE

## 2019-12-03 ENCOUNTER — Telehealth: Payer: Self-pay

## 2019-12-03 NOTE — Telephone Encounter (Signed)
Pt has not heard from [facility] for his referral.  Spoke w/pt's wife.  Referral sent 11-30-19.  Gave pt's wife [facility]'s phone#.  She will call them to schedule.

## 2019-12-10 ENCOUNTER — Other Ambulatory Visit: Payer: Self-pay | Admitting: Internal Medicine

## 2019-12-10 DIAGNOSIS — R0609 Other forms of dyspnea: Secondary | ICD-10-CM

## 2019-12-13 ENCOUNTER — Other Ambulatory Visit: Payer: Self-pay

## 2019-12-13 ENCOUNTER — Ambulatory Visit: Payer: No Typology Code available for payment source

## 2019-12-20 ENCOUNTER — Telehealth: Payer: Self-pay | Admitting: Internal Medicine

## 2019-12-20 DIAGNOSIS — G3184 Mild cognitive impairment, so stated: Secondary | ICD-10-CM | POA: Diagnosis not present

## 2019-12-20 DIAGNOSIS — M0609 Rheumatoid arthritis without rheumatoid factor, multiple sites: Secondary | ICD-10-CM | POA: Diagnosis not present

## 2019-12-20 DIAGNOSIS — M79646 Pain in unspecified finger(s): Secondary | ICD-10-CM | POA: Diagnosis not present

## 2019-12-20 DIAGNOSIS — M1990 Unspecified osteoarthritis, unspecified site: Secondary | ICD-10-CM | POA: Diagnosis not present

## 2019-12-20 DIAGNOSIS — R768 Other specified abnormal immunological findings in serum: Secondary | ICD-10-CM | POA: Diagnosis not present

## 2019-12-20 DIAGNOSIS — R0609 Other forms of dyspnea: Secondary | ICD-10-CM

## 2019-12-20 DIAGNOSIS — Z79899 Other long term (current) drug therapy: Secondary | ICD-10-CM | POA: Diagnosis not present

## 2019-12-20 NOTE — Telephone Encounter (Signed)
Yes try again please

## 2019-12-20 NOTE — Telephone Encounter (Signed)
Called and spoke with patients wife Stanton Kidney about patient's PFT results from 12/13/19. Informed her that it did not look like patient had done this test as it was still active and in his appointment desk showed him as a no show. Patient's wife stated that we needed to check our documentation and get our facts straight because she knew he was here that day. Informed her that I would go talk to my supervisor and see what I could find out and call her back.   Spoke with Leotis Shames looks like patient was arrived on 12/13/2019 3:53 PM and then appt was canceled 12/13/2019 4:39 PM. He was here as he signed form to see him and bill insurance. We spoke with Johny Drilling you states she got a teams message asking her to cancel appointment his appointment because he was unable to perform test.   Called wife back to let her know what was found out and that we have no results since the test was not completed. Informed her that I would send message to Dr. Sherene Sires to see how he wants to proceed or if he wants patient to reschedule. Wife states that if he did reschedule if she could go back with him to do test because he struggles with instructions and hearing sometimes. Informed her that we do not let anyone go back with patients. She expressed understanding. I apologized for the mix up.    Dr. Sherene Sires please advise on how you would like to proceed.

## 2019-12-20 NOTE — Telephone Encounter (Signed)
Spoke with the pt's spouse and scheduled PFT  Pt has been fully vaccinated  Will bring card

## 2019-12-31 DIAGNOSIS — R351 Nocturia: Secondary | ICD-10-CM | POA: Diagnosis not present

## 2019-12-31 DIAGNOSIS — R350 Frequency of micturition: Secondary | ICD-10-CM | POA: Diagnosis not present

## 2019-12-31 DIAGNOSIS — R3121 Asymptomatic microscopic hematuria: Secondary | ICD-10-CM | POA: Diagnosis not present

## 2020-01-20 ENCOUNTER — Ambulatory Visit: Payer: No Typology Code available for payment source

## 2020-01-20 ENCOUNTER — Other Ambulatory Visit: Payer: Self-pay

## 2020-01-20 DIAGNOSIS — R3121 Asymptomatic microscopic hematuria: Secondary | ICD-10-CM | POA: Diagnosis not present

## 2020-02-01 DIAGNOSIS — R351 Nocturia: Secondary | ICD-10-CM | POA: Diagnosis not present

## 2020-02-01 DIAGNOSIS — R3121 Asymptomatic microscopic hematuria: Secondary | ICD-10-CM | POA: Diagnosis not present

## 2020-02-02 ENCOUNTER — Telehealth: Payer: Self-pay | Admitting: *Deleted

## 2020-02-02 NOTE — Telephone Encounter (Signed)
Spoke with wife she said she never called because he seem to be doing better, I did urge her to at least let them know how he was acting after his procedure so they are aware. Wife said she will call them tomorrow but thanked [place] for checking in on pt

## 2020-02-02 NOTE — Telephone Encounter (Signed)
Please check in with them at the end of the day to see if they got through to urology

## 2020-02-02 NOTE — Telephone Encounter (Signed)
[facility] [place] Day - Client TELEPHONE ADVICE RECORD AccessNurse Patient Name: DYSEN Tafoya Gender: Male DOB: March 23, 1946 Age: 74 Y 3 M 25 D Return Phone Number: [phone] (Primary) Address[place] [address] Client [facility] Day - Client Client Site  [facility] - Day Physician Milinda Antis, Idamae Schuller - MD Contact Type Call Who Is Calling Patient / Member / Family / Caregiver Call Type Triage / Clinical Caller Name Jermain Curt Relationship To Patient Spouse Return Phone Number [phone] (Primary) Chief Complaint CONFUSION - new onset Reason for Call Symptomatic / Request for Health Information Initial Comment Caller states her husband is experiencing episodes of extreme confusion and agitation. No other sx. Translation No Nurse Assessment Nurse: Odis Luster, RN, Bjorn Loser Date/Time (Eastern Time): 02/02/2020 10:17:45 AM Confirm and document reason for call. If symptomatic, describe symptoms. ---Caller states her husband is experiencing episodes of extreme confusion and agitation. Has been dx with mild cognitive disorder but the last couple of days are worse. Reports that patient had a cystoscopy yesterday. Does the patient have any new or worsening symptoms? ---Yes Will a triage be completed? ---Yes Related visit to physician within the last 2 weeks? ---No Does the PT have any chronic conditions? (i.e. diabetes, asthma, this includes High risk factors for pregnancy, etc.) ---Yes List chronic conditions. ---dementia; Is this a behavioral health or substance abuse call? ---No Guidelines Guideline Title Affirmed Question Affirmed Notes Nurse Date/Time (Eastern Time) Confusion - Delirium [1] Longstanding confusion (e.g., dementia, stroke) AND [2] worsening Kerrin Champagne 02/02/2020 10:19:54 AM Disp. Time Lamount Cohen Time) Disposition Final User 02/02/2020 10:16:13 AM Send to Urgent Queue [place] Neighbor 02/02/2020 10:31:02 AM See HCP within 4 Hours (or PCP triage) Yes Odis Luster, RN, Bjorn Loser PLEASE NOTE: All timestamps contained within this report are represented as [place] Standard Time. CONFIDENTIALTY NOTICE: This fax transmission is intended only for the addressee. It contains information that is legally privileged, confidential or otherwise protected from use or disclosure. If you are not the intended recipient, you are strictly prohibited from reviewing, disclosing, copying using or disseminating any of this information or taking any action in reliance on or regarding this information. If you have received this fax in error, please notify us immediately by telephone so that we can arrange for its return to [place]. Phone: [phone], Toll-Free: [phone], Fax: [phone] Page: 2 of 2 Call Id: [id number] Caller Disagree/Comply Comply Caller Understands Yes PreDisposition Call Doctor Care Advice Given Per Guideline SEE HCP (OR PCP TRIAGE) WITHIN 4 HOURS: * IF OFFICE WILL BE OPEN: You need to be seen within the next 3 or 4 hours. Call your doctor (or NP/PA) now or as soon as the office opens. CALL BACK IF: * You become worse CARE ADVICE given per Confusion-Delirium (Adult) guideline. Referrals REFERRED TO PCP OFFICE

## 2020-02-02 NOTE — Telephone Encounter (Signed)
Aware, thanks!

## 2020-02-02 NOTE — Telephone Encounter (Signed)
Patient's wife called stating that she had talked with access nurse and was advised because of his symptoms he needed to go to an urgent care. Patient's wife stated that her husband saw a urologist yesterday and was scoped. Patient's wife stated that he has had a CT scan done and the scope yesterday and both test were negative. Patient's wife stated that her husband was given a medication yesterday to help with the flow of his urine but she has not picked it up yet.  Stanton Kidney stated the nurse that she talked with earlier thought that he may have a UTI since he seems to be more contused today and agitated.. After speaking to Dr. Milinda Antis patient's wife was advised that she should reach out to the urologist that he saw yesterday. Patient's wife was advised that the urologist may have done a urine test yesterday and if there was anything abnormal it may have been sent for a culture. Patient's wife stated that she will call Dr. McDermott's office and follow-up with his urologist.

## 2020-02-03 NOTE — Telephone Encounter (Signed)
Pt's wife need for you to give her a call.

## 2020-02-03 NOTE — Telephone Encounter (Signed)
If he is back to baseline then I don't think we need to.  Please keep me posted if symptoms return

## 2020-02-03 NOTE — Telephone Encounter (Signed)
Wife notified of Dr. Royden Purl comments and verbalized understanding

## 2020-02-03 NOTE — Telephone Encounter (Signed)
Pt's wife stated that she did call the urologist and they told her that pt could be developing a UTI and that she should call us to have it checked out. Pt is back to baseline with no urinary issues. No frequency, no dysuria, no hematuria, no fever, no sxs at all. Pt's wife is confused on why urologist would tell his PCP to take care of the possible UTI issue but she is just doing what they told her. Wife question If pt needs an appt or just drop off a urine for [place] to check or does PCP just want wife to keep an eye on pt since he is back to baseline.

## 2020-02-11 ENCOUNTER — Encounter: Payer: Self-pay | Admitting: Family Medicine

## 2020-02-11 ENCOUNTER — Ambulatory Visit (INDEPENDENT_AMBULATORY_CARE_PROVIDER_SITE_OTHER): Payer: No Typology Code available for payment source | Admitting: Family Medicine

## 2020-02-11 ENCOUNTER — Other Ambulatory Visit: Payer: Self-pay

## 2020-02-11 VITALS — BP 124/78 | HR 56 | Temp 97.4°F | Ht 74.0 in | Wt 218.6 lb

## 2020-02-11 DIAGNOSIS — F419 Anxiety disorder, unspecified: Secondary | ICD-10-CM | POA: Diagnosis not present

## 2020-02-11 DIAGNOSIS — R519 Headache, unspecified: Secondary | ICD-10-CM | POA: Diagnosis not present

## 2020-02-11 DIAGNOSIS — Z23 Encounter for immunization: Secondary | ICD-10-CM | POA: Diagnosis not present

## 2020-02-11 DIAGNOSIS — F0390 Unspecified dementia without behavioral disturbance: Secondary | ICD-10-CM | POA: Diagnosis not present

## 2020-02-11 DIAGNOSIS — R350 Frequency of micturition: Secondary | ICD-10-CM

## 2020-02-11 MED ORDER — MEMANTINE HCL 5 MG PO TABS: 5.0000 mg | ORAL_TABLET | Freq: Two times a day (BID) | ORAL | 1 refills | Status: DC

## 2020-02-11 NOTE — Progress Notes (Signed)
Subjective:    Patient ID: Troy [name], male    DOB: 08-26-1945, 74 y.o.   MRN: [id number]  This visit occurred during the SARS-CoV-2 public health emergency.  Safety protocols were in place, including screening questions prior to the visit, additional usage of staff PPE, and extensive cleaning of exam room while observing appropriate contact time as indicated for disinfecting solutions.    HPI Pt presents to discuss memory problems   Wt Readings from Last 3 Encounters:  02/11/20 218 lb 9 oz (99.1 kg)  11/23/19 219 lb 4 oz (99.5 kg)  11/04/19 222 lb 3.2 oz (100.8 kg)   28.06 kg/m  Known h/o MCI  Wife called on 9/22 with concern re: delerium (periods of confusion and agitation) - shortly after having a cystoscopy became agitated and belligerent at home the next day (not physically violent)  Could not be reasoned with  Was worried about uti but then he returned to normal before getting a ua   In the past eval for short term memory loss  Last MMS exam score was 26 He was started on a trial of aricept but had side effects and had to stop it  It caused bothersome hot flashes and sleeplessness   Pt states he feels back to normal (was by that afternoon)  Wife found out some things happened at work (no one told her until then)  One day had given 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 but he could not figure it out  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  (he is a [employer] fast [employer]er)  Fails to remember certain tasks and destination (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 and does not remember how to use and answer phone)  He was taken off of driving  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   He does not remember most of this   Knows he needs to stay in a routine   Today is a good day -mood wise  His driving itself is fairly good /but wife is worried   Likes to keep moving-is restless   Lab Results  Component Value Date   CREATININE 0.91 09/14/2019   BUN 21 09/14/2019    NA 141 09/14/2019   K 4.5 09/14/2019   CL 104 09/14/2019   CO2 33 (H) 09/14/2019   Lab Results  Component Value Date   WBC 6.2 09/14/2019   HGB 14.3 09/14/2019   HCT 41.8 09/14/2019   MCV 93.1 09/14/2019   PLT 228.0 09/14/2019   Lab Results  Component Value Date   VITAMINB12 300 09/14/2019   Not sleeping well  Is off prednisone   Pt mentions when he gets anxious he gets a headache  No nausea  Perhaps a little light headed   BP Readings from Last 3 Encounters:  02/11/20 124/78  11/23/19 114/68  11/04/19 120/60   Now on flomax for BPH  Thinks it may be helping   Patient Active Problem List   Diagnosis Date Noted  . Headache 02/11/2020  . Dementia (HCC) 02/11/2020  . Frequent urination 11/23/2019  . Constipation 11/23/2019  . Traumatic avulsion of nail plate of toe 67/59/1638  . BPH (benign prostatic hyperplasia) 11/23/2019  . Microscopic hematuria 11/23/2019  . Abnormal CXR 11/04/2019  . Vitamin D deficiency 09/24/2019  . Rheumatoid arthritis (HCC) 09/14/2019  . Hematuria 09/14/2019  . Mild dehydration 09/14/2019  . Positive ANA (antinuclear antibody) 03/21/2019  . Joint pain 03/16/2019  . Bilateral  hand swelling 03/11/2019  . Weakness of both lower extremities 03/11/2019  . Hyperkalemia 01/09/2017  . Anxiety 06/09/2015  . Benign paroxysmal positional vertigo 02/16/2014  . Routine general medical examination at a health care facility 12/10/2013  . Family history of colon cancer 12/10/2013  . Prostate cancer screening 12/05/2013  . Allergic rhinitis 10/18/2013  . ROTATOR CUFF INJURY, LEFT SHOULDER 04/30/2010  . Prediabetes 08/21/2009  . HYPERLIPIDEMIA 04/20/2007  . OSTEOARTHRITIS 02/09/2007   History reviewed. No pertinent past medical history. Past Surgical History:  Procedure Laterality Date  . BACK SURGERY  04/1998  . LIPOMA EXCISION  09/2009  . ROTATOR CUFF REPAIR Left 2012  . TONSILLECTOMY AND ADENOIDECTOMY  age 65   Social History   Tobacco Use   . Smoking status: Never Smoker  . Smokeless tobacco: Never Used  Substance Use Topics  . Alcohol use: No    Comment: quit over 39 years ago  . Drug use: No   Family History  Problem Relation Age of Onset  . Dementia Mother   . Colon cancer Father 25   Allergies  Allergen Reactions  . Chlorzoxazone     REACTION: dizzy  . Donepezil   . Sulfur Rash   Current Outpatient Medications on File Prior to Visit  Medication Sig Dispense Refill  . Acetaminophen (TYLENOL ARTHRITIS PAIN PO) Take by mouth.    . baricitinib (OLUMIANT) 2 MG TABS tablet Take 2 mg by mouth daily.    [name] 575 MG/5ML SYRP Take by mouth.    . folic acid (FOLVITE) 1 MG tablet Take 1 mg by mouth daily.    . methotrexate (RHEUMATREX) 2.5 MG tablet Take 8 tablets by mouth once a week.    . tamsulosin (FLOMAX) 0.4 MG CAPS capsule Take 0.4 mg by mouth daily.    Marland Kitchen VITAMIN D PO Take 5,000 Units by mouth daily.     No current facility-administered medications on file prior to visit.     Review of Systems  Constitutional: Negative for activity change, appetite change, fatigue, fever and unexpected weight change.  HENT: Negative for congestion, rhinorrhea, sore throat and trouble swallowing.   Eyes: Negative for pain, redness, itching and visual disturbance.  Respiratory: Negative for cough, chest tightness, shortness of breath and wheezing.   Cardiovascular: Negative for chest pain and palpitations.  Gastrointestinal: Negative for abdominal pain, blood in stool, constipation, diarrhea and nausea.  Endocrine: Negative for cold intolerance, heat intolerance, polydipsia and polyuria.  Genitourinary: Positive for frequency. Negative for decreased urine volume, difficulty urinating, dysuria, hematuria and urgency.       Some improvement in urination with flomax  Musculoskeletal: Negative for arthralgias, joint swelling, myalgias and neck pain.  Skin: Negative for pallor and rash.  Neurological: Positive for  light-headedness and headaches. Negative for dizziness, tremors, seizures, syncope, facial asymmetry, speech difficulty, weakness and numbness.  Hematological: Negative for adenopathy. Does not bruise/bleed easily.  Psychiatric/Behavioral: Positive for agitation, confusion and decreased concentration. Negative for dysphoric mood, self-injury, sleep disturbance and suicidal ideas. The patient is nervous/anxious.        Objective:   Physical Exam Constitutional:      General: He is not in acute distress.    Appearance: Normal appearance. He is well-developed and normal weight. He is not ill-appearing.  HENT:     Head: Normocephalic and atraumatic.     Comments: No facial tenderness Eyes:     General: No scleral icterus.    Conjunctiva/sclera: Conjunctivae normal.  Pupils: Pupils are equal, round, and reactive to light.  Neck:     Thyroid: No thyromegaly.     Vascular: No carotid bruit or JVD.  Cardiovascular:     Rate and Rhythm: Regular rhythm. Bradycardia present.     Heart sounds: Normal heart sounds. No gallop.   Pulmonary:     Effort: Pulmonary effort is normal. No respiratory distress.     Breath sounds: Normal breath sounds. No wheezing or rales.  Abdominal:     General: Bowel sounds are normal. There is no distension or abdominal bruit.     Palpations: Abdomen is soft. There is no mass.     Tenderness: There is no abdominal tenderness.  Musculoskeletal:     Cervical back: Normal range of motion and neck supple. No tenderness.     Right lower leg: No edema.     Left lower leg: No edema.  Lymphadenopathy:     Cervical: No cervical adenopathy.  Skin:    General: Skin is warm and dry.     Coloration: Skin is not pale.     Findings: No rash.  Neurological:     Mental Status: He is alert.     Cranial Nerves: No cranial nerve deficit.     Sensory: No sensory deficit.     Motor: No weakness.     Coordination: Coordination normal.     Gait: Gait normal.     Deep Tendon  Reflexes: Reflexes are normal and symmetric. Reflexes normal.     Comments: No focal neuro deficits Nl gait   Psychiatric:        Mood and Affect: Mood is anxious.        Behavior: Behavior is not agitated or aggressive.        Thought Content: Thought content is not paranoid.        Cognition and Memory: Cognition is impaired. He exhibits impaired recent memory.     Comments: Mildly anxious but not agitated  Pt repeats himself frequently but notes awareness of his cognitive deficits  When asked to remember/re tell past events he has no difficulty             Assessment & Plan:   Problem List Items Addressed This Visit      Nervous and Auditory   Dementia (HCC) - Primary    Progressive from last dx of MCI -now with worse short term memory and more episodes of confusion and agitation Has unfortunately lost his job (unable to perform it and he agrees) There will be a struggle to keep him engaged and busy Recommended book "the 36 hour day" to wife and family Plan to start on Namenda (as he was intol of aricept)- start with 5 mg qd, inc to bid after a week and then call to update in 2 weeks (will titrate up as tolerated)  Disc goal to slow down progression of dz  In light of recent headaches - CT head w/o contrast ordered Ref made to neuro for help in management  Disc use of ssri like paroxetine to help mood/agitation once he is est on namenda       Relevant Medications   memantine (NAMENDA) 5 MG tablet   Other Relevant Orders   CT Head Wo Contrast   Ambulatory referral to Neurology     Other   Anxiety    Anxiety symptoms of irritability and agitation have worsened with advancement of cognitive disorder/dementia  Disc pros/cons of different treatments for this  Plan to start paroxetine once he is est on namenda  Also ref made to neurology      Frequent urination    From BPH- seeing urology Urine stream is improved with Flomax so far Had reassuring cystoscopy       Headache    Pt has developed some new headaches (with light headedness) Reassuring exam  In light of this with worse cognitive status- CT of head w/o contrast ordered  Enc to stay well hydrated and regular walking for exercise        Relevant Orders   CT Head Wo Contrast   Ambulatory referral to Neurology    Other Visit Diagnoses    Need for influenza vaccination       Relevant Orders   Flu Vaccine QUAD High Dose(Fluad) (Completed)

## 2020-02-11 NOTE — Patient Instructions (Addendum)
I do recommend retiring from job  Do some exercise - walking may be best   For safety - continue to supervise /stay close   I want to try namenda next for cognitive and memory problems  Start namenda 5 mg each evening for a week  Then increase to twice daily  After another week call or email and let me know how this is doing  If any side effects please stop it and let me know     At that time I would like to start paxil for anxiety/agitation/mood   Melatonin is good for sleep   I placed an order for head CT scan Also for referral to neurology  Our office will call you to get those set up

## 2020-02-12 NOTE — Assessment & Plan Note (Signed)
Progressive from last dx of MCI -now with worse short term memory and more episodes of confusion and agitation Has unfortunately lost his job (unable to perform it and he agrees) There will be a struggle to keep him engaged and busy Recommended book "the 36 hour day" to wife and family Plan to start on Namenda (as he was intol of aricept)- start with 5 mg qd, inc to bid after a week and then call to update in 2 weeks (will titrate up as tolerated)  Disc goal to slow down progression of dz  In light of recent headaches - CT head w/o contrast ordered Ref made to neuro for help in management  Disc use of ssri like paroxetine to help mood/agitation once he is est on namenda

## 2020-02-12 NOTE — Assessment & Plan Note (Signed)
Anxiety symptoms of irritability and agitation have worsened with advancement of cognitive disorder/dementia  Disc pros/cons of different treatments for this  Plan to start paroxetine once he is est on namenda  Also ref made to neurology

## 2020-02-12 NOTE — Assessment & Plan Note (Signed)
Pt has developed some new headaches (with light headedness) Reassuring exam  In light of this with worse cognitive status- CT of head w/o contrast ordered  Enc to stay well hydrated and regular walking for exercise

## 2020-02-12 NOTE — Assessment & Plan Note (Signed)
From BPH- seeing urology Urine stream is improved with Flomax so far Had reassuring cystoscopy

## 2020-02-16 ENCOUNTER — Telehealth: Payer: Self-pay | Admitting: Family Medicine

## 2020-02-16 NOTE — Telephone Encounter (Signed)
Patient has dropped off disability forms. IN blue folder to be completed.

## 2020-02-18 NOTE — Telephone Encounter (Signed)
Called wife answered ? And faxed from to fax @ listed, copy placed at the front for wife to pick up and copy sent to scanning

## 2020-02-18 NOTE — Telephone Encounter (Signed)
Gillis Santa found ppw and it's in your inbox (blue folder)

## 2020-02-18 NOTE — Telephone Encounter (Signed)
Done and in IN box  Please ask what last day worked was (can ask wife) for page 2

## 2020-02-18 NOTE — Telephone Encounter (Signed)
I have not seen the paperwork.

## 2020-03-02 ENCOUNTER — Other Ambulatory Visit: Payer: Self-pay

## 2020-03-02 ENCOUNTER — Ambulatory Visit
Admission: RE | Admit: 2020-03-02 | Discharge: 2020-03-02 | Disposition: A | Discharge status: Home / Self Care | Payer: PPO | Source: Ambulatory Visit | Attending: Family Medicine | Admitting: Family Medicine

## 2020-03-02 DIAGNOSIS — R4182 Altered mental status, unspecified: Secondary | ICD-10-CM | POA: Diagnosis not present

## 2020-03-02 DIAGNOSIS — I739 Peripheral vascular disease, unspecified: Secondary | ICD-10-CM | POA: Diagnosis not present

## 2020-03-02 DIAGNOSIS — G319 Degenerative disease of nervous system, unspecified: Secondary | ICD-10-CM | POA: Diagnosis not present

## 2020-03-02 DIAGNOSIS — R42 Dizziness and giddiness: Secondary | ICD-10-CM | POA: Diagnosis not present

## 2020-03-02 DIAGNOSIS — F0390 Unspecified dementia without behavioral disturbance: Secondary | ICD-10-CM

## 2020-03-02 DIAGNOSIS — R519 Headache, unspecified: Secondary | ICD-10-CM

## 2020-03-03 ENCOUNTER — Telehealth: Payer: Self-pay | Admitting: *Deleted

## 2020-03-03 MED ORDER — MEMANTINE HCL 10 MG PO TABS: 10.0000 mg | ORAL_TABLET | Freq: Two times a day (BID) | ORAL | 5 refills | Status: DC

## 2020-03-03 NOTE — Telephone Encounter (Signed)
Wife notified and verbalized understanding.  

## 2020-03-03 NOTE — Telephone Encounter (Signed)
If he is taking namenda 5 mg bid now  Go up to 5 in am and 10 in pm for a week  Then 10 mg bid Keep me posted Thanks

## 2020-03-03 NOTE — Telephone Encounter (Signed)
Pt's wife called Triage. She was advised to update PCP on how pt's doing on the namenda. Wife said it seems to be helping some, he's doing okay with it but wife would like the dose increased to see if it will help a little more as her and Dr. Milinda Antis discussed. Wife would like Rx sent to [facility]/ [address]

## 2020-03-27 DIAGNOSIS — G3184 Mild cognitive impairment, so stated: Secondary | ICD-10-CM | POA: Diagnosis not present

## 2020-03-27 DIAGNOSIS — Z79899 Other long term (current) drug therapy: Secondary | ICD-10-CM | POA: Diagnosis not present

## 2020-03-27 DIAGNOSIS — M0609 Rheumatoid arthritis without rheumatoid factor, multiple sites: Secondary | ICD-10-CM | POA: Diagnosis not present

## 2020-03-27 DIAGNOSIS — M79646 Pain in unspecified finger(s): Secondary | ICD-10-CM | POA: Diagnosis not present

## 2020-03-27 DIAGNOSIS — R768 Other specified abnormal immunological findings in serum: Secondary | ICD-10-CM | POA: Diagnosis not present

## 2020-03-27 DIAGNOSIS — M1990 Unspecified osteoarthritis, unspecified site: Secondary | ICD-10-CM | POA: Diagnosis not present

## 2020-03-29 DIAGNOSIS — R4586 Emotional lability: Secondary | ICD-10-CM | POA: Diagnosis not present

## 2020-03-29 DIAGNOSIS — G3183 Dementia with Lewy bodies: Secondary | ICD-10-CM | POA: Diagnosis not present

## 2020-03-29 DIAGNOSIS — R413 Other amnesia: Secondary | ICD-10-CM | POA: Diagnosis not present

## 2020-03-29 DIAGNOSIS — R519 Headache, unspecified: Secondary | ICD-10-CM | POA: Diagnosis not present

## 2020-03-29 DIAGNOSIS — F0281 Dementia in other diseases classified elsewhere with behavioral disturbance: Secondary | ICD-10-CM | POA: Diagnosis not present

## 2020-04-18 DIAGNOSIS — Z03818 Encounter for observation for suspected exposure to other biological agents ruled out: Secondary | ICD-10-CM | POA: Diagnosis not present

## 2020-04-18 DIAGNOSIS — Z20822 Contact with and (suspected) exposure to covid-19: Secondary | ICD-10-CM | POA: Diagnosis not present

## 2020-04-20 ENCOUNTER — Encounter: Payer: Self-pay | Admitting: Family Medicine

## 2020-04-20 ENCOUNTER — Telehealth (INDEPENDENT_AMBULATORY_CARE_PROVIDER_SITE_OTHER): Payer: PPO | Admitting: Family Medicine

## 2020-04-20 DIAGNOSIS — J069 Acute upper respiratory infection, unspecified: Secondary | ICD-10-CM | POA: Diagnosis not present

## 2020-04-20 NOTE — Assessment & Plan Note (Signed)
Nasal cong/rhinorrhea and cough with clear mucous and neg covid test (also immunized) Immuno comp from rheum medications (holding olumiant)   Fluids and rest and nasal saline recommended  flonase prn  Expectorant pulse DM ok prn Tylenol   Update if not starting to improve in a week or if worsening   Will call if fever or wheezine or sob (ER if severe)

## 2020-04-20 NOTE — Patient Instructions (Signed)
Drink fluids and rest  Try nasal saline for congestion along with steam  flonase is ok  mucinex DM if needed for cough/contestion  Tylenol for headache   Update if not starting to improve in a week or if worsening   Call if fever or wheezing  Go to ER if symptoms become suddenly severe

## 2020-04-20 NOTE — Assessment & Plan Note (Signed)
Nasal cong/rhinorrhea and cough with clear mucous and neg covid test (also immunized) Immuno comp from rheum medications (holding olumiant)   Fluids and rest and nasal saline recommended  flonase prn  Expectorant pulse DM ok prn Tylenol   Update if not starting to improve in a week or if worsening   Will call if fever or wheezine or sob (ER if severe)  

## 2020-04-20 NOTE — Progress Notes (Signed)
Virtual Visit via Video Note  I connected with Troy [name] on 04/20/20 at 12:15 PM EST by a video enabled telemedicine application and verified that I am speaking with the correct person using two identifiers.  Location: Patient: home Provider: office   I discussed the limitations of evaluation and management by telemedicine and the availability of in person appointments. The patient expressed understanding and agreed to proceed.  Parties involved in encounter  Patient: Troy [name] Wife Troy [name]   Provider:  Roxy Manns MD    History of Present Illness: Pt presents with symptoms of uri since 04/17/20   Nasal congestion with drainage  Had covid booster 11/30    Cough-mucous in throat  Sometimes productive (but phlegm is more from throat)  All clear  No wheeze  No sob  A little pressure under eyes   No fever or aches or chills    A little blood from R nostril   Headaches -mild (forehead)  No ST Ears - ring a little/no pain or pressure   No n/v/d   No loss of taste or smell   Tuesday neg covid test from pharmacy    Otc: Zyrtec  vics vapor rub   Takes olumuninant and mtx  Holding the olumnant   Patient Active Problem List   Diagnosis Date Noted  . Viral URI with cough 04/20/2020  . Headache 02/11/2020  . Dementia (HCC) 02/11/2020  . Frequent urination 11/23/2019  . Constipation 11/23/2019  . Traumatic avulsion of nail plate of toe 18/29/9371  . BPH (benign prostatic hyperplasia) 11/23/2019  . Microscopic hematuria 11/23/2019  . Abnormal CXR 11/04/2019  . Vitamin D deficiency 09/24/2019  . Rheumatoid arthritis (HCC) 09/14/2019  . Hematuria 09/14/2019  . Mild dehydration 09/14/2019  . Positive ANA (antinuclear antibody) 03/21/2019  . Joint pain 03/16/2019  . Bilateral hand swelling 03/11/2019  . Weakness of both lower extremities 03/11/2019  . Hyperkalemia 01/09/2017  . Anxiety 06/09/2015  . Benign paroxysmal positional vertigo 02/16/2014   . Routine general medical examination at a health care facility 12/10/2013  . Family history of colon cancer 12/10/2013  . Prostate cancer screening 12/05/2013  . Allergic rhinitis 10/18/2013  . ROTATOR CUFF INJURY, LEFT SHOULDER 04/30/2010  . Prediabetes 08/21/2009  . HYPERLIPIDEMIA 04/20/2007  . OSTEOARTHRITIS 02/09/2007   History reviewed. No pertinent past medical history. Past Surgical History:  Procedure Laterality Date  . BACK SURGERY  04/1998  . LIPOMA EXCISION  09/2009  . ROTATOR CUFF REPAIR Left 2012  . TONSILLECTOMY AND ADENOIDECTOMY  age 18   Social History   Tobacco Use  . Smoking status: Never Smoker  . Smokeless tobacco: Never Used  Substance Use Topics  . Alcohol use: No    Comment: quit over 39 years ago  . Drug use: No   Family History  Problem Relation Age of Onset  . Dementia Mother   . Colon cancer Father 40   Allergies  Allergen Reactions  . Chlorzoxazone     REACTION: dizzy  . Donepezil   . Sulfur Rash   Current Outpatient Medications on File Prior to Visit  Medication Sig Dispense Refill  . Acetaminophen (TYLENOL ARTHRITIS PAIN PO) Take by mouth.    . baricitinib (OLUMIANT) 2 MG TABS tablet Take 2 mg by mouth daily.    [name] 575 MG/5ML SYRP Take by mouth.    . folic acid (FOLVITE) 1 MG tablet Take 1 mg by mouth daily.    . memantine (NAMENDA) 10 MG  tablet Take 1 tablet (10 mg total) by mouth 2 (two) times daily. 60 tablet 5  . methotrexate (RHEUMATREX) 2.5 MG tablet Take 8 tablets by mouth once a week.    . tamsulosin (FLOMAX) 0.4 MG CAPS capsule Take 0.4 mg by mouth daily.    Marland Kitchen VITAMIN D PO Take 5,000 Units by mouth daily.     No current facility-administered medications on file prior to visit.    Review of Systems  Constitutional: Negative for chills, fever and malaise/fatigue.  HENT: Positive for congestion and nosebleeds. Negative for ear pain, sinus pain and sore throat.   Eyes: Negative for blurred vision, discharge and  redness.  Respiratory: Positive for cough and sputum production. Negative for shortness of breath, wheezing and stridor.   Cardiovascular: Negative for chest pain, palpitations and leg swelling.  Gastrointestinal: Negative for abdominal pain, diarrhea, nausea and vomiting.  Musculoskeletal: Negative for myalgias.  Skin: Negative for rash.  Neurological: Positive for headaches. Negative for dizziness.      Observations/Objective: Patient appears well, in no distress Weight is baseline  No facial swelling or asymmetry Normal voice-not hoarse and no slurred speech No obvious tremor or mobility impairment Moving neck and UEs normally Able to hear the call well  No wheeze or sob  occ hacking cough  Talkative and stable cognitively (dementia) , talkative today  No skin changes on face or neck , no rash or pallor Affect is normal    Assessment and Plan: Problem List Items Addressed This Visit      Respiratory   Viral URI with cough    Nasal cong/rhinorrhea and cough with clear mucous and neg covid test (also immunized) Immuno comp from rheum medications (holding olumiant)   Fluids and rest and nasal saline recommended  flonase prn  Expectorant pulse DM ok prn Tylenol   Update if not starting to improve in a week or if worsening   Will call if fever or wheezine or sob (ER if severe)           Follow Up Instructions: Drink fluids and rest  Try nasal saline for congestion along with steam  flonase is ok  mucinex DM if needed for cough/contestion  Tylenol for headache   Update if not starting to improve in a week or if worsening   Call if fever or wheezing  Go to ER if symptoms become suddenly severe    I discussed the assessment and treatment plan with the patient. The patient was provided an opportunity to ask questions and all were answered. The patient agreed with the plan and demonstrated an understanding of the instructions.   The patient was advised to call back  or seek an in-person evaluation if the symptoms worsen or if the condition fails to improve as anticipated.     Roxy Manns, MD

## 2020-04-23 DIAGNOSIS — Z03818 Encounter for observation for suspected exposure to other biological agents ruled out: Secondary | ICD-10-CM | POA: Diagnosis not present

## 2020-04-23 DIAGNOSIS — Z20822 Contact with and (suspected) exposure to covid-19: Secondary | ICD-10-CM | POA: Diagnosis not present

## 2020-05-02 DIAGNOSIS — M25512 Pain in left shoulder: Secondary | ICD-10-CM | POA: Diagnosis not present

## 2020-05-03 ENCOUNTER — Telehealth: Payer: Self-pay

## 2020-05-03 NOTE — Telephone Encounter (Signed)
[facility] Day - Client TELEPHONE ADVICE RECORD [facility] Patient Name: Troy [name] Gender: Male DOB: [birth year] Age: 74 Y 6 M 24 D Return Phone Number: [phone] (Primary) [place] [address] Client [facility] Day - Client Client Site [facility] - Day Physician Milinda Antis, Idamae Schuller - MD Contact Type Call Who Is Calling Patient / Member / Family / Caregiver Call Type Triage / Clinical Caller Name [name] Relationship To Patient Spouse Return Phone Number [phone] (Primary) Chief Complaint Nosebleed Reason for Call Symptomatic / Request for Health Information Initial Comment Caller states her husband's bp has been in the 140's. He has a headache and 2 nose bleeds. Translation No Nurse Assessment Nurse: Alexander Mt, RN, Nicholaus Bloom Date/Time (Eastern Time): 05/03/2020 8:51:35 AM Confirm and document reason for call. If symptomatic, describe symptoms. ---Caller states her husband's blood pressure was149/?? in the ortho office yesterday. He has a headache and 2 nose bleeds. Headache is present now but is mild. States he is feeling lightheaded. He had a nosebleed times 2 this weekend just small amount when wiping his nose or blowing. He is not on BP meds. He has been having left shoulder pain. Does the patient have any new or worsening symptoms? ---Yes Will a triage be completed? ---Yes Related visit to physician within the last 2 weeks? ---No Does the PT have any chronic conditions? (i.e. diabetes, asthma, this includes High risk factors for pregnancy, etc.) ---Yes List chronic conditions. ---dementia, RA Is this a behavioral health or substance abuse call? ---No Guidelines Guideline Title Affirmed Question Affirmed Notes Nurse Date/Time (Eastern Time) Headache [1] New headache AND [2] age > 65 Alexander Mt, RN, Nicholaus Bloom 05/03/2020 8:56:06 AM Disp. Time Lamount Cohen Time) Disposition Final  User 05/03/2020 9:00:14 AM See PCP within 24 Hours Yes Deyton, RN, Nicholaus Bloom PLEASE NOTE: All timestamps contained within this report are represented as [place] Standard Time. CONFIDENTIALTY NOTICE: This fax transmission is intended only for the addressee. It contains information that is legally privileged, confidential or otherwise protected from use or disclosure. If you are not the intended recipient, you are strictly prohibited from reviewing, disclosing, copying using or disseminating any of this information or taking any action in reliance on or regarding this information. If you have received this fax in error, please notify us immediately by telephone so that we can arrange for its return to [place]. Phone: [phone], Toll-Free: [phone], Fax: [phone] Page: 2 of 2 Call Id: [id number] Caller Disagree/Comply Comply Caller Understands Yes PreDisposition Did not know what to do Care Advice Given Per Guideline SEE PCP WITHIN 24 HOURS: * IF OFFICE WILL BE OPEN: You need to be examined within the next 24 hours. Call your doctor (or NP/PA) when the office opens and make an appointment. PAIN MEDICINES: * ACETAMINOPHEN - REGULAR STRENGTH TYLENOL: Take 650 mg (two 325 mg pills) by mouth every 4 to 6 hours as needed. Each Regular Strength Tylenol pill has 325 mg of acetaminophen. The most you should take each day is 3,250 mg (10 pills a day). * IBUPROFEN (E.G., MOTRIN, ADVIL): Take 400 mg (two 200 mg pills) by mouth every 6 hours. The most you should take each day is 1,200 mg (six 200 mg pills), unless your doctor has told you to take more. REST: * Lie down in a dark quiet place and relax until feeling better. LOCAL COLD: * Apply a cold wet washcloth or cold pack to the forehead for 20 minutes. CALL BACK IF: *  Blurred vision or double vision occurs * Numbness or weakness of the face, arm or leg * Difficulty with speaking * You become worse CARE ADVICE given per Headache (Adult)  guideline. Referrals REFERRED TO PCP OFFICE

## 2020-05-03 NOTE — Telephone Encounter (Deleted)
[facility] Day - Client TELEPHONE ADVICE RECORD [facility] Patient Name: Troy [name] Gender: Male DOB: 11/04/1946 Age: 73 Y 5 M 28 D Return Phone Number: [phone] (Primary), [phone] (Secondary) Address: City/State/Zip: [place] [address] Client [facility] Day - Client Client Site  [facility] - Day Physician Copland, Spencer - MD Contact Type Call Who Is Calling Patient / Member / Family / Caregiver Call Type Triage / Clinical Caller Name Georgia [name] Relationship To Patient Spouse Return Phone Number [phone] (Secondary) Chief Complaint Facial Swelling Reason for Call Symptomatic / Request for Health Information Initial Comment Caller states her husband has a rash that is getting worse. His eye and face are also swollen. GOTO Facility Not Listed [facility] ER Translation No Nurse Assessment Nurse: Wade, RN, Beverly Date/Time (Eastern Time): 05/03/2020 8:44:35 AM Confirm and document reason for call. If symptomatic, describe symptoms. ---Caller states she is having a rash with facial and eye swelling. Yesterday he had a rash on stomach and chest. He first noticed yesterday morning.He is having some eye swelling and face swelling. The rash is little red bumps that are going together and are smooth. He has no fever. His eyes were about shut when he woke up . Took children's benadryl, 15 ml at 530 am. At 730 am was given a benadryl tablet, 25 mg. No difficulty breathing or swallowing. Does the patient have any new or worsening symptoms? ---Yes Will a triage be completed? ---Yes Related visit to physician within the last 2 weeks? ---No Does the PT have any chronic conditions? (i.e. diabetes, asthma, this includes High risk factors for pregnancy, etc.) ---Yes List chronic conditions. ---diabetic, HTN, COPD, cholesterol Is this a behavioral health or substance abuse call?  ---No Guidelines Guideline Title Affirmed Question Affirmed Notes Nurse Date/Time (Eastern Time) Face Swelling Taking an ACE Inhibitor medication (e.g., benazepril/LOTENSIN, captopril/CAPOTEN, enalapril/VASOTEC, lisinopril/ZESTRIL) Wade, RN, Beverly 05/03/2020 8:49:48 AM PLEASE NOTE: All timestamps contained within this report are represented as Eastern Standard Time. CONFIDENTIALTY NOTICE: This fax transmission is intended only for the addressee. It contains information that is legally privileged, confidential or otherwise protected from use or disclosure. If you are not the intended recipient, you are strictly prohibited from reviewing, disclosing, copying using or disseminating any of this information or taking any action in reliance on or regarding this information. If you have received this fax in error, please notify us immediately by telephone so that we can arrange for its return to us. Phone: [phone], Toll-Free: [phone], Fax: [phone] Page: 2 of 2 Call Id: [id number] Disp. Time (Eastern Time) Disposition Final User 05/03/2020 8:53:42 AM Go to ED Now (or PCP triage) Yes Wade, RN, Beverly Caller Disagree/Comply Comply Caller Understands Yes PreDisposition Call Doctor Care Advice Given Per Guideline GO TO ED NOW (OR PCP TRIAGE): CALL EMS 911 IF: * Any difficulty breathing or swallowing * Any tongue swelling or swelling inside your mouth. CARE ADVICE given per Face Swelling (Adult) guideline. Referrals GO TO FACILITY OTHER - SPECIFY 

## 2020-05-03 NOTE — Telephone Encounter (Signed)
I spoke with pts wife(DPR signed);pt has h/a that was severe last night and now the pain level is 8. Pt has lingering dry cough; pt also experiencing lightheadedness as well. No CP or SOB. BP now 116/62 P 64. Pt last rapid covid test at [facility] was 04/23/20 and was negative.pt is in no distress with breathing. Pt does not want to go to UC today due to no appts at [facility]. Due to severe h/a and dry cough pt has video appt with DR Tower on 05/04/20 at 10 AM. UC & ED precautions given and pts wife voiced understanding.

## 2020-05-04 ENCOUNTER — Telehealth (INDEPENDENT_AMBULATORY_CARE_PROVIDER_SITE_OTHER): Payer: PPO | Admitting: Family Medicine

## 2020-05-04 ENCOUNTER — Other Ambulatory Visit: Payer: Self-pay

## 2020-05-04 ENCOUNTER — Encounter: Payer: Self-pay | Admitting: Family Medicine

## 2020-05-04 DIAGNOSIS — J019 Acute sinusitis, unspecified: Secondary | ICD-10-CM | POA: Insufficient documentation

## 2020-05-04 DIAGNOSIS — J0110 Acute frontal sinusitis, unspecified: Secondary | ICD-10-CM | POA: Diagnosis not present

## 2020-05-04 MED ORDER — AMOXICILLIN-POT CLAVULANATE 875-125 MG PO TABS: 1.0000 | ORAL_TABLET | Freq: Two times a day (BID) | ORAL | 0 refills | Status: DC

## 2020-05-04 NOTE — Progress Notes (Signed)
Virtual Visit via Video Note  I connected with Troy [name] on 05/04/20 at 10:00 AM EST by a video enabled telemedicine application and verified that I am speaking with the correct person using two identifiers.  Location: Patient: home Provider: office   I discussed the limitations of evaluation and management by telemedicine and the availability of in person appointments. The patient expressed understanding and agreed to proceed.  Parties involved in encounter  Patient: Troy [name] Wife: York Ram  Provider:  Roxy Manns MD    History of Present Illness: Pt presents for headache and cough   Had a cold recently and took mucinex  C/o off/on with L shoulder pain   -then had a fall and hit the shoulder (missed bottom step)  Went to ortho- given shot of cortisone and noted bp was high  Systolic 149   Now headaches -face and forehead  Both sides  occ light headedness  Now a little blood from nostril- just when wiping  No colored mucous   A little tenderness in forehead  No facial swelling  Nose is mildly congested   Very little to no cough   Neg covid test 04/23/20 at [facility]   Patient Active Problem List   Diagnosis Date Noted  . Acute sinusitis 05/04/2020  . Viral URI with cough 04/20/2020  . Headache 02/11/2020  . Dementia (HCC) 02/11/2020  . Frequent urination 11/23/2019  . Constipation 11/23/2019  . Traumatic avulsion of nail plate of toe 24/40/1027  . BPH (benign prostatic hyperplasia) 11/23/2019  . Microscopic hematuria 11/23/2019  . Abnormal CXR 11/04/2019  . Vitamin D deficiency 09/24/2019  . Rheumatoid arthritis (HCC) 09/14/2019  . Hematuria 09/14/2019  . Mild dehydration 09/14/2019  . Positive ANA (antinuclear antibody) 03/21/2019  . Joint pain 03/16/2019  . Bilateral hand swelling 03/11/2019  . Weakness of both lower extremities 03/11/2019  . Hyperkalemia 01/09/2017  . Anxiety 06/09/2015  . Benign paroxysmal positional vertigo 02/16/2014  .  Routine general medical examination at a health care facility 12/10/2013  . Family history of colon cancer 12/10/2013  . Prostate cancer screening 12/05/2013  . Allergic rhinitis 10/18/2013  . ROTATOR CUFF INJURY, LEFT SHOULDER 04/30/2010  . Prediabetes 08/21/2009  . HYPERLIPIDEMIA 04/20/2007  . OSTEOARTHRITIS 02/09/2007   History reviewed. No pertinent past medical history. Past Surgical History:  Procedure Laterality Date  . BACK SURGERY  04/1998  . LIPOMA EXCISION  09/2009  . ROTATOR CUFF REPAIR Left 2012  . TONSILLECTOMY AND ADENOIDECTOMY  age 59   Social History   Tobacco Use  . Smoking status: Never Smoker  . Smokeless tobacco: Never Used  Substance Use Topics  . Alcohol use: No    Comment: quit over 39 years ago  . Drug use: No   Family History  Problem Relation Age of Onset  . Dementia Mother   . Colon cancer Father 50   Allergies  Allergen Reactions  . Chlorzoxazone     REACTION: dizzy  . Donepezil   . Sulfur Rash   Current Outpatient Medications on File Prior to Visit  Medication Sig Dispense Refill  . Acetaminophen (TYLENOL ARTHRITIS PAIN PO) Take by mouth.    . baricitinib (OLUMIANT) 2 MG TABS tablet Take 2 mg by mouth daily.    [name] 575 MG/5ML SYRP Take by mouth.    . folic acid (FOLVITE) 1 MG tablet Take 1 mg by mouth daily.    . memantine (NAMENDA) 10 MG tablet Take 1 tablet (10  mg total) by mouth 2 (two) times daily. 60 tablet 5  . methotrexate (RHEUMATREX) 2.5 MG tablet Take 8 tablets by mouth once a week.    . tamsulosin (FLOMAX) 0.4 MG CAPS capsule Take 0.4 mg by mouth daily.    Marland Kitchen VITAMIN D PO Take 5,000 Units by mouth daily.     No current facility-administered medications on file prior to visit.   Review of Systems  Constitutional: Negative for chills, fever and malaise/fatigue.  HENT: Positive for congestion and sinus pain. Negative for ear pain and sore throat.   Eyes: Negative for blurred vision, discharge and redness.  Respiratory:  Positive for cough. Negative for sputum production, shortness of breath, wheezing and stridor.   Cardiovascular: Negative for chest pain, palpitations and leg swelling.  Gastrointestinal: Negative for abdominal pain, diarrhea, nausea and vomiting.  Musculoskeletal: Negative for myalgias.       Shoulder pain is improved  Skin: Negative for rash.  Neurological: Positive for headaches. Negative for dizziness.       Occ light headed    Observations/Objective: Patient appears well, in no distress Weight is baseline  No facial swelling or asymmetry Some tenderness when he presses on cheeks Normal voice-not hoarse and no slurred speech No obvious tremor or mobility impairment Moving neck and UEs normally Able to hear the call well  No cough or shortness of breath during interview  Baseline dementia -occ repeats himself No skin changes on face or neck , no rash or pallor Affect is normal    Assessment and Plan: Problem List Items Addressed This Visit      Respiratory   Acute sinusitis    S/p viral uri with neg covid testing  Tender forehead, headaches and congestion  Recommend nasal saline vaseline prn for cracked nasal mucosa  augmentin px  Update if not starting to improve in a week or if worsening    Meds ordered this encounter  Medications  . amoxicillin-clavulanate (AUGMENTIN) 875-125 MG tablet    Sig: Take 1 tablet by mouth 2 (two) times daily.    Dispense:  14 tablet    Refill:  0         Relevant Medications   amoxicillin-clavulanate (AUGMENTIN) 875-125 MG tablet       Follow Up Instructions:   Take augmentin as directed  Use vaseline for cracks in nose  A humidifier may help Drink lots of fluids Update if not starting to improve in a week or if worsening   I discussed the assessment and treatment plan with the patient. The patient was provided an opportunity to ask questions and all were answered. The patient agreed with the plan and demonstrated an  understanding of the instructions.   The patient was advised to call back or seek an in-person evaluation if the symptoms worsen or if the condition fails to improve as anticipated.     Roxy Manns, MD

## 2020-05-04 NOTE — Assessment & Plan Note (Signed)
S/p viral uri with neg covid testing  Tender forehead, headaches and congestion  Recommend nasal saline vaseline prn for cracked nasal mucosa  augmentin px  Update if not starting to improve in a week or if worsening    Meds ordered this encounter  Medications  . amoxicillin-clavulanate (AUGMENTIN) 875-125 MG tablet    Sig: Take 1 tablet by mouth 2 (two) times daily.    Dispense:  14 tablet    Refill:  0

## 2020-05-07 NOTE — Patient Instructions (Signed)
Take augmentin as directed  Use vaseline for cracks in nose  A humidifier may help Drink lots of fluids Update if not starting to improve in a week or if worsening

## 2020-05-08 ENCOUNTER — Encounter: Payer: Self-pay | Admitting: Family Medicine

## 2020-05-08 ENCOUNTER — Other Ambulatory Visit: Payer: Self-pay

## 2020-05-08 ENCOUNTER — Ambulatory Visit (INDEPENDENT_AMBULATORY_CARE_PROVIDER_SITE_OTHER): Payer: PPO | Admitting: Family Medicine

## 2020-05-08 VITALS — BP 124/78 | HR 73 | Temp 97.5°F | Ht 74.0 in | Wt 227.0 lb

## 2020-05-08 DIAGNOSIS — W19XXXA Unspecified fall, initial encounter: Secondary | ICD-10-CM | POA: Insufficient documentation

## 2020-05-08 DIAGNOSIS — Z23 Encounter for immunization: Secondary | ICD-10-CM | POA: Diagnosis not present

## 2020-05-08 DIAGNOSIS — Y92009 Unspecified place in unspecified non-institutional (private) residence as the place of occurrence of the external cause: Secondary | ICD-10-CM | POA: Diagnosis not present

## 2020-05-08 DIAGNOSIS — J0110 Acute frontal sinusitis, unspecified: Secondary | ICD-10-CM

## 2020-05-08 DIAGNOSIS — S61412A Laceration without foreign body of left hand, initial encounter: Secondary | ICD-10-CM | POA: Diagnosis not present

## 2020-05-08 NOTE — Progress Notes (Signed)
Subjective:    Patient ID: Troy [name], male    DOB: [birth year], 74 y.o.   MRN: [id number]  This visit occurred during the SARS-CoV-2 public health emergency.  Safety protocols were in place, including screening questions prior to the visit, additional usage of staff PPE, and extensive cleaning of exam room while observing appropriate contact time as indicated for disinfecting solutions.    HPI Pt presents with hand injury after a fall   Had a fall/does not quite remember  May have tripped /fell/hit corner of the table   (middle of the night)  Does not remember LOC   Left hand hit something or tried to break his fall  Thinks he hit end of a table  Bled quite a bit  Still oozing (bled through band aids)   Still has bandage on from 10 am  Does not hurt  Looks like a skin tear    Had Tdap 9/16  Currently on augmentin for sinus infection   Patient Active Problem List   Diagnosis Date Noted  . Skin tear of left hand without complication 05/08/2020  . Laceration of left hand 05/08/2020  . Acute sinusitis 05/04/2020  . Headache 02/11/2020  . Dementia (HCC) 02/11/2020  . Frequent urination 11/23/2019  . Constipation 11/23/2019  . Traumatic avulsion of nail plate of toe 03/00/9233  . BPH (benign prostatic hyperplasia) 11/23/2019  . Microscopic hematuria 11/23/2019  . Abnormal CXR 11/04/2019  . Vitamin D deficiency 09/24/2019  . Rheumatoid arthritis (HCC) 09/14/2019  . Hematuria 09/14/2019  . Mild dehydration 09/14/2019  . Positive ANA (antinuclear antibody) 03/21/2019  . Joint pain 03/16/2019  . Bilateral hand swelling 03/11/2019  . Weakness of both lower extremities 03/11/2019  . Hyperkalemia 01/09/2017  . Anxiety 06/09/2015  . Benign paroxysmal positional vertigo 02/16/2014  . Routine general medical examination at a health care facility 12/10/2013  . Family history of colon cancer 12/10/2013  . Prostate cancer screening 12/05/2013  . Allergic rhinitis 10/18/2013   . ROTATOR CUFF INJURY, LEFT SHOULDER 04/30/2010  . Prediabetes 08/21/2009  . HYPERLIPIDEMIA 04/20/2007  . OSTEOARTHRITIS 02/09/2007   History reviewed. No pertinent past medical history. Past Surgical History:  Procedure Laterality Date  . BACK SURGERY  04/1998  . LIPOMA EXCISION  09/2009  . ROTATOR CUFF REPAIR Left 2012  . TONSILLECTOMY AND ADENOIDECTOMY  age 57   Social History   Tobacco Use  . Smoking status: Never Smoker  . Smokeless tobacco: Never Used  Substance Use Topics  . Alcohol use: No    Comment: quit over 39 years ago  . Drug use: No   Family History  Problem Relation Age of Onset  . Dementia Mother   . Colon cancer Father 7   Allergies  Allergen Reactions  . Chlorzoxazone     REACTION: dizzy  . Donepezil   . Sulfur Rash   Current Outpatient Medications on File Prior to Visit  Medication Sig Dispense Refill  . Acetaminophen (TYLENOL ARTHRITIS PAIN PO) Take by mouth.    Marland Kitchen amoxicillin-clavulanate (AUGMENTIN) 875-125 MG tablet Take 1 tablet by mouth 2 (two) times daily. 14 tablet 0  . baricitinib (OLUMIANT) 2 MG TABS tablet Take 2 mg by mouth daily.    [name] 575 MG/5ML SYRP Take by mouth.    . folic acid (FOLVITE) 1 MG tablet Take 1 mg by mouth daily.    . memantine (NAMENDA) 10 MG tablet Take 1 tablet (10 mg total) by mouth 2 (two) times daily.  60 tablet 5  . methotrexate (RHEUMATREX) 2.5 MG tablet Take 8 tablets by mouth once a week.    . tamsulosin (FLOMAX) 0.4 MG CAPS capsule Take 0.4 mg by mouth daily.    Marland Kitchen VITAMIN D PO Take 5,000 Units by mouth daily.     No current facility-administered medications on file prior to visit.      Review of Systems  Constitutional: Negative for activity change, appetite change, fatigue, fever and unexpected weight change.  HENT: Negative for congestion, rhinorrhea, sore throat and trouble swallowing.   Eyes: Negative for pain, redness, itching and visual disturbance.  Respiratory: Negative for cough, chest  tightness, shortness of breath and wheezing.   Cardiovascular: Negative for chest pain and palpitations.  Gastrointestinal: Negative for abdominal pain, blood in stool, constipation, diarrhea and nausea.  Endocrine: Negative for cold intolerance, heat intolerance, polydipsia and polyuria.  Genitourinary: Negative for difficulty urinating, dysuria, frequency and urgency.  Musculoskeletal: Negative for arthralgias, joint swelling and myalgias.  Skin: Positive for wound. Negative for pallor and rash.  Neurological: Negative for dizziness, tremors, weakness, numbness and headaches.  Hematological: Negative for adenopathy. Does not bruise/bleed easily.  Psychiatric/Behavioral: Positive for confusion and decreased concentration. Negative for dysphoric mood. The patient is not nervous/anxious.        Objective:   Physical Exam Constitutional:      General: He is not in acute distress.    Appearance: Normal appearance. He is normal weight. He is not ill-appearing.  HENT:     Head:     Comments: Small bruise on top of head No knot No tender areas  Eyes:     General: No scleral icterus.       Right eye: No discharge.        Left eye: No discharge.     Conjunctiva/sclera: Conjunctivae normal.     Pupils: Pupils are equal, round, and reactive to light.  Cardiovascular:     Rate and Rhythm: Normal rate and regular rhythm.     Heart sounds: Normal heart sounds.  Pulmonary:     Effort: Pulmonary effort is normal. No respiratory distress.     Breath sounds: Normal breath sounds. No wheezing.  Musculoskeletal:        General: No tenderness.     Cervical back: Normal range of motion and neck supple. No tenderness.     Right lower leg: No edema.     Left lower leg: No edema.     Comments: Nl rom of L hand  Nl sensation and perfusion    Lymphadenopathy:     Cervical: No cervical adenopathy.  Skin:    Coloration: Skin is not pale.     Findings: No erythema or rash.     Comments: Skin tear  on L hand and wrist  Lateral 2-3 cm  One superior 1-2 cm   One closer to elbow 1 cm-that is linear shaped   Scant oozing- blood and clear fluids  No erythema or swelling  Neurological:     Mental Status: He is alert.     Sensory: No sensory deficit.     Coordination: Coordination normal.  Psychiatric:        Mood and Affect: Mood normal.           Assessment & Plan:   Problem List Items Addressed This Visit      Respiratory   RESOLVED: Acute sinusitis     Musculoskeletal and Integument   Skin tear of left hand without  complication - Primary    S/p fall early this am  Several skin tears -bleeding stopped for the most part  Dressed with non stick pads/triple abx ointment and coban  Disc wound care-see AVS Soap/water/loose non stick drsg and abx ointment  Do not submerge  Disc prev of falls  Td updated       Relevant Orders   Td : Tetanus/diphtheria >7yo Preservative  free (Completed)     Other   Laceration of left hand    Skin tear Td updated      Relevant Orders   Td : Tetanus/diphtheria >7yo Preservative  free (Completed)   Fall at home, initial encounter    Early this am after getting up too fast  Disc fall precautions Due to dementia-may not remember to sit before getting up  May benefit from recliner  Wife will work on a system to watch carefully

## 2020-05-08 NOTE — Assessment & Plan Note (Signed)
S/p fall early this am  Several skin tears -bleeding stopped for the most part  Dressed with non stick pads/triple abx ointment and coban  Disc wound care-see AVS Soap/water/loose non stick drsg and abx ointment  Do not submerge  Disc prev of falls  Td updated

## 2020-05-08 NOTE — Assessment & Plan Note (Signed)
Early this am after getting up too fast  Disc fall precautions Due to dementia-may not remember to sit before getting up  May benefit from recliner  Wife will work on a system to watch carefully

## 2020-05-08 NOTE — Patient Instructions (Signed)
Keep wound clean with soap and water (do not submerge just just let it run over)   No peroxide   Use antibiotic ointment and non stick pads held on by coban wrap or ace wrap  Dress 1-2 times daily until it starts to look dry  Watch for redness/swelling or pain   Update the tetanus shot today   Keep [place] posted- this will take a while to heal  The skin flap will likely dry out and fall off

## 2020-05-08 NOTE — Assessment & Plan Note (Signed)
Skin tear Td updated

## 2020-05-24 DIAGNOSIS — R4586 Emotional lability: Secondary | ICD-10-CM | POA: Diagnosis not present

## 2020-05-24 DIAGNOSIS — R519 Headache, unspecified: Secondary | ICD-10-CM | POA: Diagnosis not present

## 2020-05-24 DIAGNOSIS — R413 Other amnesia: Secondary | ICD-10-CM | POA: Diagnosis not present

## 2020-06-01 DIAGNOSIS — R351 Nocturia: Secondary | ICD-10-CM | POA: Diagnosis not present

## 2020-06-01 DIAGNOSIS — R350 Frequency of micturition: Secondary | ICD-10-CM | POA: Diagnosis not present

## 2020-06-02 DIAGNOSIS — S46011A Strain of muscle(s) and tendon(s) of the rotator cuff of right shoulder, initial encounter: Secondary | ICD-10-CM | POA: Diagnosis not present

## 2020-06-15 ENCOUNTER — Telehealth: Payer: Self-pay

## 2020-06-15 DIAGNOSIS — R590 Localized enlarged lymph nodes: Secondary | ICD-10-CM | POA: Insufficient documentation

## 2020-06-15 DIAGNOSIS — R599 Enlarged lymph nodes, unspecified: Secondary | ICD-10-CM

## 2020-06-15 NOTE — Telephone Encounter (Signed)
Pt's wife called to see if Dr Milinda Antis has received any messages from the urologist in regards to a repeat CT in 3-6 months of his abdomen and pelvis for some lymph nodes in the groin area. She said it sounds like it needed to be ordered by Dr Milinda Antis. Please advise at [phone].

## 2020-06-15 NOTE — Telephone Encounter (Signed)
I updated location in the imaging order and it was sent to Dr Milinda Antis for co sign.

## 2020-06-15 NOTE — Addendum Note (Signed)
Addended by: Consuella Lose on: 06/15/2020 12:58 PM   Modules accepted: Orders

## 2020-06-15 NOTE — Telephone Encounter (Signed)
I put order in  He will get a call  Note if her pref one area over another  Thanks   If radiology thinks with contrast is better please let me know

## 2020-06-15 NOTE — Telephone Encounter (Signed)
Pt's wife notified order done, they would like to have it done in [place], I advise pt our [facility] will call to schedule

## 2020-06-16 NOTE — Telephone Encounter (Signed)
Noted  

## 2020-06-19 DIAGNOSIS — M948X1 Other specified disorders of cartilage, shoulder: Secondary | ICD-10-CM | POA: Diagnosis not present

## 2020-06-19 DIAGNOSIS — M67813 Other specified disorders of tendon, right shoulder: Secondary | ICD-10-CM | POA: Diagnosis not present

## 2020-06-19 DIAGNOSIS — S46011A Strain of muscle(s) and tendon(s) of the rotator cuff of right shoulder, initial encounter: Secondary | ICD-10-CM | POA: Diagnosis not present

## 2020-06-19 DIAGNOSIS — M24111 Other articular cartilage disorders, right shoulder: Secondary | ICD-10-CM | POA: Diagnosis not present

## 2020-06-19 DIAGNOSIS — M19011 Primary osteoarthritis, right shoulder: Secondary | ICD-10-CM | POA: Diagnosis not present

## 2020-06-19 DIAGNOSIS — M898X1 Other specified disorders of bone, shoulder: Secondary | ICD-10-CM | POA: Diagnosis not present

## 2020-06-19 DIAGNOSIS — S43431A Superior glenoid labrum lesion of right shoulder, initial encounter: Secondary | ICD-10-CM | POA: Diagnosis not present

## 2020-06-19 DIAGNOSIS — M25411 Effusion, right shoulder: Secondary | ICD-10-CM | POA: Diagnosis not present

## 2020-06-20 ENCOUNTER — Ambulatory Visit (INDEPENDENT_AMBULATORY_CARE_PROVIDER_SITE_OTHER): Payer: PPO | Admitting: Family Medicine

## 2020-06-20 ENCOUNTER — Telehealth: Payer: Self-pay | Admitting: *Deleted

## 2020-06-20 ENCOUNTER — Other Ambulatory Visit: Payer: Self-pay

## 2020-06-20 ENCOUNTER — Encounter: Payer: Self-pay | Admitting: Family Medicine

## 2020-06-20 VITALS — BP 108/66 | HR 58 | Temp 96.9°F | Ht 74.0 in | Wt 225.4 lb

## 2020-06-20 DIAGNOSIS — B356 Tinea cruris: Secondary | ICD-10-CM | POA: Diagnosis not present

## 2020-06-20 DIAGNOSIS — S46011S Strain of muscle(s) and tendon(s) of the rotator cuff of right shoulder, sequela: Secondary | ICD-10-CM | POA: Diagnosis not present

## 2020-06-20 DIAGNOSIS — R3129 Other microscopic hematuria: Secondary | ICD-10-CM | POA: Diagnosis not present

## 2020-06-20 DIAGNOSIS — M75101 Unspecified rotator cuff tear or rupture of right shoulder, not specified as traumatic: Secondary | ICD-10-CM | POA: Insufficient documentation

## 2020-06-20 DIAGNOSIS — R410 Disorientation, unspecified: Secondary | ICD-10-CM

## 2020-06-20 LAB — POC URINALSYSI DIPSTICK (AUTOMATED)
Bilirubin, UA: NEGATIVE
Blood, UA: 80
Glucose, UA: NEGATIVE
Ketones, UA: NEGATIVE
Leukocytes, UA: NEGATIVE
Nitrite, UA: NEGATIVE
Protein, UA: NEGATIVE
Spec Grav, UA: 1.025 (ref 1.010–1.025)
Urobilinogen, UA: 0.2 E.U./dL
pH, UA: 6 (ref 5.0–8.0)

## 2020-06-20 MED ORDER — FLUCONAZOLE 100 MG PO TABS
100.0000 mg | ORAL_TABLET | Freq: Every day | ORAL | 0 refills | Status: DC
Start: 2020-06-20 — End: 2020-10-12

## 2020-06-20 MED ORDER — CLOTRIMAZOLE POWD: 3 refills | Status: DC

## 2020-06-20 NOTE — Assessment & Plan Note (Signed)
Rev MRI from [facility]  This occurred from fall (during agitation from dementia) Not in a lot of pain He has orthop f/u on monday

## 2020-06-20 NOTE — Assessment & Plan Note (Signed)
Originally treated by urology with lotrisone cream and fluconazole This improved and then rebounded a bit (now more erythema on scrotum and penis)  Disc imp of keeping dry  May consider boxer briefs  Px fluconazole 100 daily for another 3 d  Change topical to clotrimazole powder bid  Update if not starting to improve in a week or if worsening

## 2020-06-20 NOTE — Assessment & Plan Note (Signed)
Still present  No indication of uti  Seeing urology

## 2020-06-20 NOTE — Telephone Encounter (Signed)
Patient's wife called stating that she noticed that her husband's penis and scrotum is red.  Patient's wife stated that her husband had a rash in the creases in between his legs about 3 weeks ago and saw the urologist. Patient's wife stated that her husband was given antibiotics and cream which seemed to clear that problem up. Patient's wife stated that the urologist told them to follow-up in 4 weeks and they could not get an appointment until sometime in March. Patient's wife stated that she does not know how long it would take to get her husband in for this acute issue with the urologist. Patient scheduled today with Dr. Milinda Antis at 12:30 pm.

## 2020-06-20 NOTE — Telephone Encounter (Signed)
Will see as planned.

## 2020-06-20 NOTE — Progress Notes (Signed)
Subjective:    Patient ID: Troy [name], male    DOB: [birth year], 75 y.o.   MRN: [id number]  This visit occurred during the SARS-CoV-2 public health emergency.  Safety protocols were in place, including screening questions prior to the visit, additional usage of staff PPE, and extensive cleaning of exam room while observing appropriate contact time as indicated for disinfecting solutions.    HPI Pt presents with rash in groin   Saw urology for routine visit and was tx with lotrisone cream and also diflucan (for fungal infection)   It did improve and now it is back again -almost back to normal  Tip of penis is red as well as scrotum  Looks a bit different  No skin breakdown No drainage  No pain  Some itching   Diflucan 100 mg times 3    Has baseline blood in urine Results for orders placed or performed in visit on 06/20/20  POCT Urinalysis Dipstick (Automated)  Result Value Ref Range   Color, UA Yellow    Clarity, UA Clear    Glucose, UA Negative Negative   Bilirubin, UA Negative    Ketones, UA Negative    Spec Grav, UA 1.025 1.010 - 1.025   Blood, UA 80 Ery/uL    pH, UA 6.0 5.0 - 8.0   Protein, UA Negative Negative   Urobilinogen, UA 0.2 0.2 or 1.0 E.U./dL   Nitrite, UA Negative    Leukocytes, UA Negative Negative    Had MRI of shoulder yesterday at [facility] -for injury after a fall  Rotator cuff tear and deg change   Patient Active Problem List   Diagnosis Date Noted  . Tinea cruris 06/20/2020  . Right rotator cuff tear 06/20/2020  . Mesenteric lymphadenopathy 06/15/2020  . Skin tear of left hand without complication 05/08/2020  . Laceration of left hand 05/08/2020  . Fall at home, initial encounter 05/08/2020  . Headache 02/11/2020  . Dementia (HCC) 02/11/2020  . Frequent urination 11/23/2019  . Constipation 11/23/2019  . Traumatic avulsion of nail plate of toe 78/29/5621  . BPH (benign prostatic hyperplasia) 11/23/2019  . Microscopic hematuria 11/23/2019   . Abnormal CXR 11/04/2019  . Vitamin D deficiency 09/24/2019  . Rheumatoid arthritis (HCC) 09/14/2019  . Hematuria 09/14/2019  . Mild dehydration 09/14/2019  . Positive ANA (antinuclear antibody) 03/21/2019  . Joint pain 03/16/2019  . Bilateral hand swelling 03/11/2019  . Weakness of both lower extremities 03/11/2019  . Hyperkalemia 01/09/2017  . Anxiety 06/09/2015  . Benign paroxysmal positional vertigo 02/16/2014  . Routine general medical examination at a health care facility 12/10/2013  . Family history of colon cancer 12/10/2013  . Prostate cancer screening 12/05/2013  . Allergic rhinitis 10/18/2013  . ROTATOR CUFF INJURY, LEFT SHOULDER 04/30/2010  . Prediabetes 08/21/2009  . HYPERLIPIDEMIA 04/20/2007  . OSTEOARTHRITIS 02/09/2007   History reviewed. No pertinent past medical history. Past Surgical History:  Procedure Laterality Date  . BACK SURGERY  04/1998  . LIPOMA EXCISION  09/2009  . ROTATOR CUFF REPAIR Left 2012  . TONSILLECTOMY AND ADENOIDECTOMY  age 31   Social History   Tobacco Use  . Smoking status: Never Smoker  . Smokeless tobacco: Never Used  Substance Use Topics  . Alcohol use: No    Comment: quit over 39 years ago  . Drug use: No   Family History  Problem Relation Age of Onset  . Dementia Mother   . Colon cancer Father 15   Allergies  Allergen Reactions  .  Chlorzoxazone     REACTION: dizzy  . Donepezil   . Elemental Sulfur Rash   Current Outpatient Medications on File Prior to Visit  Medication Sig Dispense Refill  . Acetaminophen (TYLENOL ARTHRITIS PAIN PO) Take by mouth.    . baricitinib (OLUMIANT) 2 MG TABS tablet Take 2 mg by mouth daily.    . clotrimazole-betamethasone (LOTRISONE) cream Apply 1 application topically 2 (two) times daily as needed.    . donepezil (ARICEPT) 5 MG tablet Take 5 tablets by mouth daily.    [name] 575 MG/5ML SYRP Take by mouth.    . folic acid (FOLVITE) 1 MG tablet Take 1 mg by mouth daily.    .  memantine (NAMENDA) 10 MG tablet Take 1 tablet (10 mg total) by mouth 2 (two) times daily. 60 tablet 5  . methotrexate (RHEUMATREX) 2.5 MG tablet Take 8 tablets by mouth once a week.    . mirabegron ER (MYRBETRIQ) 25 MG TB24 tablet Take 25 mg by mouth daily.    . traZODone (DESYREL) 50 MG tablet Take 1 tablet by mouth daily.    Marland Kitchen venlafaxine (EFFEXOR) 37.5 MG tablet Take 1 tablet by mouth daily as needed.    Marland Kitchen VITAMIN D PO Take 5,000 Units by mouth daily.     No current facility-administered medications on file prior to visit.     Review of Systems  Constitutional: Negative for activity change, appetite change, fatigue, fever and unexpected weight change.  HENT: Negative for congestion, rhinorrhea, sore throat and trouble swallowing.   Eyes: Negative for pain, redness, itching and visual disturbance.  Respiratory: Negative for cough, chest tightness, shortness of breath and wheezing.   Cardiovascular: Negative for chest pain and palpitations.  Gastrointestinal: Negative for abdominal pain, blood in stool, constipation, diarrhea and nausea.  Endocrine: Negative for cold intolerance, heat intolerance, polydipsia and polyuria.  Genitourinary: Negative for difficulty urinating, dysuria, frequency and urgency.  Musculoskeletal: Positive for arthralgias. Negative for joint swelling and myalgias.  Skin: Positive for rash. Negative for pallor.  Neurological: Negative for dizziness, tremors, weakness, numbness and headaches.  Hematological: Negative for adenopathy. Does not bruise/bleed easily.  Psychiatric/Behavioral: Positive for confusion and decreased concentration. Negative for dysphoric mood. The patient is not nervous/anxious.        Dementia       Objective:   Physical Exam Constitutional:      General: He is not in acute distress.    Appearance: Normal appearance. He is normal weight. He is not ill-appearing.  Eyes:     General:        Right eye: No discharge.        Left eye: No  discharge.     Conjunctiva/sclera: Conjunctivae normal.     Pupils: Pupils are equal, round, and reactive to light.  Cardiovascular:     Rate and Rhythm: Regular rhythm. Bradycardia present.  Pulmonary:     Effort: Pulmonary effort is normal. No respiratory distress.  Abdominal:     General: Abdomen is flat. Bowel sounds are normal. There is no distension.     Palpations: Abdomen is soft. There is no mass.     Tenderness: There is no abdominal tenderness. There is no guarding or rebound.  Musculoskeletal:     Cervical back: Normal range of motion. No rigidity.  Lymphadenopathy:     Lower Body: No right inguinal adenopathy. No left inguinal adenopathy.  Skin:    General: Skin is warm and dry.     Findings: Rash  present.     Comments: Erythematous rash on penis and scrotum with some scale Also scant rash in thigh creases No open areas and no satellite lesions   No broken skin  Neurological:     Mental Status: He is alert.     Motor: No weakness.  Psychiatric:        Mood and Affect: Mood normal.           Assessment & Plan:   Problem List Items Addressed This Visit      Musculoskeletal and Integument   Tinea cruris - Primary    Originally treated by urology with lotrisone cream and fluconazole This improved and then rebounded a bit (now more erythema on scrotum and penis)  Disc imp of keeping dry  May consider boxer briefs  Px fluconazole 100 daily for another 3 d  Change topical to clotrimazole powder bid  Update if not starting to improve in a week or if worsening        Relevant Medications   clotrimazole-betamethasone (LOTRISONE) cream   fluconazole (DIFLUCAN) 100 MG tablet   Clotrimazole POWD   Right rotator cuff tear    Rev MRI from [facility]  This occurred from fall (during agitation from dementia) Not in a lot of pain He has orthop f/u on monday        Genitourinary   Microscopic hematuria    Still present  No indication of uti  Seeing urology        Other Visit Diagnoses    Confusion       Relevant Orders   POCT Urinalysis Dipstick (Automated) (Completed)

## 2020-06-20 NOTE — Patient Instructions (Signed)
Keep the genital area clean and dry  Dry well after bathing  Instead of the cream, try clotrimazole powder twice daily  Take the oral fluconazole daily for 3 days   Update if not starting to improve in a week or if worsening    Urinalysis is stable

## 2020-06-21 ENCOUNTER — Telehealth: Payer: Self-pay | Admitting: *Deleted

## 2020-06-21 MED ORDER — NYSTATIN 100000 UNIT/GM EX POWD: 1.0000 "application " | Freq: Three times a day (TID) | CUTANEOUS | 2 refills | Status: DC | PRN

## 2020-06-21 NOTE — Telephone Encounter (Signed)
[facility] Faxed saying they do not carry powder, would probably be a compounding pharmacy.   They are requesting the clotrimazole powder be sent to a compounding pharmacy or change it to an alt med  [facility] [address]

## 2020-06-21 NOTE — Telephone Encounter (Signed)
I sent in nystatin powder instead

## 2020-06-27 DIAGNOSIS — M1990 Unspecified osteoarthritis, unspecified site: Secondary | ICD-10-CM | POA: Diagnosis not present

## 2020-06-27 DIAGNOSIS — Z79899 Other long term (current) drug therapy: Secondary | ICD-10-CM | POA: Diagnosis not present

## 2020-06-27 DIAGNOSIS — M0609 Rheumatoid arthritis without rheumatoid factor, multiple sites: Secondary | ICD-10-CM | POA: Diagnosis not present

## 2020-06-27 DIAGNOSIS — G3184 Mild cognitive impairment, so stated: Secondary | ICD-10-CM | POA: Diagnosis not present

## 2020-06-27 DIAGNOSIS — R768 Other specified abnormal immunological findings in serum: Secondary | ICD-10-CM | POA: Diagnosis not present

## 2020-06-27 DIAGNOSIS — M79646 Pain in unspecified finger(s): Secondary | ICD-10-CM | POA: Diagnosis not present

## 2020-06-27 DIAGNOSIS — M25511 Pain in right shoulder: Secondary | ICD-10-CM | POA: Diagnosis not present

## 2020-07-03 ENCOUNTER — Other Ambulatory Visit: Payer: Self-pay

## 2020-07-03 ENCOUNTER — Ambulatory Visit
Admission: RE | Admit: 2020-07-03 | Discharge: 2020-07-03 | Disposition: A | Discharge status: Home / Self Care | Payer: PPO | Source: Ambulatory Visit | Attending: Family Medicine | Admitting: Family Medicine

## 2020-07-03 DIAGNOSIS — K5730 Diverticulosis of large intestine without perforation or abscess without bleeding: Secondary | ICD-10-CM | POA: Diagnosis not present

## 2020-07-03 DIAGNOSIS — R599 Enlarged lymph nodes, unspecified: Secondary | ICD-10-CM

## 2020-07-03 DIAGNOSIS — N200 Calculus of kidney: Secondary | ICD-10-CM | POA: Diagnosis not present

## 2020-07-05 DIAGNOSIS — M75121 Complete rotator cuff tear or rupture of right shoulder, not specified as traumatic: Secondary | ICD-10-CM | POA: Diagnosis not present

## 2020-07-06 ENCOUNTER — Telehealth: Payer: Self-pay | Admitting: Family Medicine

## 2020-07-06 DIAGNOSIS — E782 Mixed hyperlipidemia: Secondary | ICD-10-CM

## 2020-07-06 MED ORDER — ROSUVASTATIN CALCIUM 5 MG PO TABS
5.0000 mg | ORAL_TABLET | Freq: Every day | ORAL | 3 refills | Status: DC
Start: 2020-07-06 — End: 2021-06-29

## 2020-07-06 NOTE — Telephone Encounter (Signed)
-----   Message from Shon Millet, [place] sent at 07/06/2020 12:25 PM EST ----- Wife advise of CT results and Dr. Royden Purl comments. They agree with starting a statin for cholesterol, [facility] [address]

## 2020-07-06 NOTE — Telephone Encounter (Signed)
I sent generic crestor to take 5 mg daily  Check fasting labs in 6-8 weeks please

## 2020-07-07 NOTE — Telephone Encounter (Signed)
Called and updated patient of this information. Patient verbalized understanding. Lab appointment scheduled for 4/25.

## 2020-07-10 DIAGNOSIS — M6281 Muscle weakness (generalized): Secondary | ICD-10-CM | POA: Diagnosis not present

## 2020-07-10 DIAGNOSIS — M75121 Complete rotator cuff tear or rupture of right shoulder, not specified as traumatic: Secondary | ICD-10-CM | POA: Diagnosis not present

## 2020-07-10 DIAGNOSIS — M25611 Stiffness of right shoulder, not elsewhere classified: Secondary | ICD-10-CM | POA: Diagnosis not present

## 2020-07-14 DIAGNOSIS — M6281 Muscle weakness (generalized): Secondary | ICD-10-CM | POA: Diagnosis not present

## 2020-07-14 DIAGNOSIS — M25611 Stiffness of right shoulder, not elsewhere classified: Secondary | ICD-10-CM | POA: Diagnosis not present

## 2020-07-14 DIAGNOSIS — M75121 Complete rotator cuff tear or rupture of right shoulder, not specified as traumatic: Secondary | ICD-10-CM | POA: Diagnosis not present

## 2020-07-17 DIAGNOSIS — M25611 Stiffness of right shoulder, not elsewhere classified: Secondary | ICD-10-CM | POA: Diagnosis not present

## 2020-07-17 DIAGNOSIS — M6281 Muscle weakness (generalized): Secondary | ICD-10-CM | POA: Diagnosis not present

## 2020-07-17 DIAGNOSIS — M75121 Complete rotator cuff tear or rupture of right shoulder, not specified as traumatic: Secondary | ICD-10-CM | POA: Diagnosis not present

## 2020-07-18 DIAGNOSIS — R350 Frequency of micturition: Secondary | ICD-10-CM | POA: Diagnosis not present

## 2020-07-18 DIAGNOSIS — R3121 Asymptomatic microscopic hematuria: Secondary | ICD-10-CM | POA: Diagnosis not present

## 2020-07-18 DIAGNOSIS — R351 Nocturia: Secondary | ICD-10-CM | POA: Diagnosis not present

## 2020-07-19 DIAGNOSIS — M25611 Stiffness of right shoulder, not elsewhere classified: Secondary | ICD-10-CM | POA: Diagnosis not present

## 2020-07-19 DIAGNOSIS — M75121 Complete rotator cuff tear or rupture of right shoulder, not specified as traumatic: Secondary | ICD-10-CM | POA: Diagnosis not present

## 2020-07-19 DIAGNOSIS — M6281 Muscle weakness (generalized): Secondary | ICD-10-CM | POA: Diagnosis not present

## 2020-07-24 DIAGNOSIS — M25611 Stiffness of right shoulder, not elsewhere classified: Secondary | ICD-10-CM | POA: Diagnosis not present

## 2020-07-24 DIAGNOSIS — M6281 Muscle weakness (generalized): Secondary | ICD-10-CM | POA: Diagnosis not present

## 2020-07-24 DIAGNOSIS — M75121 Complete rotator cuff tear or rupture of right shoulder, not specified as traumatic: Secondary | ICD-10-CM | POA: Diagnosis not present

## 2020-07-26 DIAGNOSIS — M6281 Muscle weakness (generalized): Secondary | ICD-10-CM | POA: Diagnosis not present

## 2020-07-26 DIAGNOSIS — M25611 Stiffness of right shoulder, not elsewhere classified: Secondary | ICD-10-CM | POA: Diagnosis not present

## 2020-07-26 DIAGNOSIS — M75121 Complete rotator cuff tear or rupture of right shoulder, not specified as traumatic: Secondary | ICD-10-CM | POA: Diagnosis not present

## 2020-07-31 DIAGNOSIS — M75121 Complete rotator cuff tear or rupture of right shoulder, not specified as traumatic: Secondary | ICD-10-CM | POA: Diagnosis not present

## 2020-07-31 DIAGNOSIS — M25611 Stiffness of right shoulder, not elsewhere classified: Secondary | ICD-10-CM | POA: Diagnosis not present

## 2020-07-31 DIAGNOSIS — M6281 Muscle weakness (generalized): Secondary | ICD-10-CM | POA: Diagnosis not present

## 2020-08-02 DIAGNOSIS — R296 Repeated falls: Secondary | ICD-10-CM | POA: Diagnosis not present

## 2020-08-02 DIAGNOSIS — R4586 Emotional lability: Secondary | ICD-10-CM | POA: Diagnosis not present

## 2020-08-02 DIAGNOSIS — H53149 Visual discomfort, unspecified: Secondary | ICD-10-CM | POA: Diagnosis not present

## 2020-08-02 DIAGNOSIS — R413 Other amnesia: Secondary | ICD-10-CM | POA: Diagnosis not present

## 2020-08-02 DIAGNOSIS — F413 Other mixed anxiety disorders: Secondary | ICD-10-CM | POA: Diagnosis not present

## 2020-08-02 DIAGNOSIS — R519 Headache, unspecified: Secondary | ICD-10-CM | POA: Diagnosis not present

## 2020-08-03 DIAGNOSIS — H52203 Unspecified astigmatism, bilateral: Secondary | ICD-10-CM | POA: Diagnosis not present

## 2020-08-03 DIAGNOSIS — M25611 Stiffness of right shoulder, not elsewhere classified: Secondary | ICD-10-CM | POA: Diagnosis not present

## 2020-08-03 DIAGNOSIS — H524 Presbyopia: Secondary | ICD-10-CM | POA: Diagnosis not present

## 2020-08-03 DIAGNOSIS — H5203 Hypermetropia, bilateral: Secondary | ICD-10-CM | POA: Diagnosis not present

## 2020-08-03 DIAGNOSIS — H2513 Age-related nuclear cataract, bilateral: Secondary | ICD-10-CM | POA: Diagnosis not present

## 2020-08-03 DIAGNOSIS — H25013 Cortical age-related cataract, bilateral: Secondary | ICD-10-CM | POA: Diagnosis not present

## 2020-08-03 DIAGNOSIS — M6281 Muscle weakness (generalized): Secondary | ICD-10-CM | POA: Diagnosis not present

## 2020-08-03 DIAGNOSIS — M75121 Complete rotator cuff tear or rupture of right shoulder, not specified as traumatic: Secondary | ICD-10-CM | POA: Diagnosis not present

## 2020-08-07 DIAGNOSIS — M25611 Stiffness of right shoulder, not elsewhere classified: Secondary | ICD-10-CM | POA: Diagnosis not present

## 2020-08-07 DIAGNOSIS — M75121 Complete rotator cuff tear or rupture of right shoulder, not specified as traumatic: Secondary | ICD-10-CM | POA: Diagnosis not present

## 2020-08-07 DIAGNOSIS — M6281 Muscle weakness (generalized): Secondary | ICD-10-CM | POA: Diagnosis not present

## 2020-08-09 DIAGNOSIS — M25611 Stiffness of right shoulder, not elsewhere classified: Secondary | ICD-10-CM | POA: Diagnosis not present

## 2020-08-09 DIAGNOSIS — M6281 Muscle weakness (generalized): Secondary | ICD-10-CM | POA: Diagnosis not present

## 2020-08-09 DIAGNOSIS — M75121 Complete rotator cuff tear or rupture of right shoulder, not specified as traumatic: Secondary | ICD-10-CM | POA: Diagnosis not present

## 2020-08-14 DIAGNOSIS — M25611 Stiffness of right shoulder, not elsewhere classified: Secondary | ICD-10-CM | POA: Diagnosis not present

## 2020-08-14 DIAGNOSIS — M6281 Muscle weakness (generalized): Secondary | ICD-10-CM | POA: Diagnosis not present

## 2020-08-14 DIAGNOSIS — M75121 Complete rotator cuff tear or rupture of right shoulder, not specified as traumatic: Secondary | ICD-10-CM | POA: Diagnosis not present

## 2020-08-16 DIAGNOSIS — M75121 Complete rotator cuff tear or rupture of right shoulder, not specified as traumatic: Secondary | ICD-10-CM | POA: Diagnosis not present

## 2020-08-16 DIAGNOSIS — M25611 Stiffness of right shoulder, not elsewhere classified: Secondary | ICD-10-CM | POA: Diagnosis not present

## 2020-08-16 DIAGNOSIS — M6281 Muscle weakness (generalized): Secondary | ICD-10-CM | POA: Diagnosis not present

## 2020-08-18 ENCOUNTER — Other Ambulatory Visit: Payer: Self-pay | Admitting: Family Medicine

## 2020-08-18 DIAGNOSIS — R21 Rash and other nonspecific skin eruption: Secondary | ICD-10-CM | POA: Diagnosis not present

## 2020-08-18 NOTE — Telephone Encounter (Signed)
Mrs. Betker called in and stated that the rash on his groin area is back and wanted to know if Dr. Milinda Antis can send in another script for antibiotics. He does have an appointment on Monday with Dr. Para March @1130    Please advise

## 2020-08-18 NOTE — Telephone Encounter (Signed)
I assume they mean the diflucan? If so-pended to send

## 2020-08-21 ENCOUNTER — Ambulatory Visit: Payer: PPO | Admitting: Family Medicine

## 2020-08-21 NOTE — Telephone Encounter (Signed)
Pt went to [facility] after phone call here so no Rx was sent

## 2020-08-24 DIAGNOSIS — Z79899 Other long term (current) drug therapy: Secondary | ICD-10-CM | POA: Diagnosis not present

## 2020-08-24 DIAGNOSIS — M25511 Pain in right shoulder: Secondary | ICD-10-CM | POA: Diagnosis not present

## 2020-08-24 DIAGNOSIS — M1990 Unspecified osteoarthritis, unspecified site: Secondary | ICD-10-CM | POA: Diagnosis not present

## 2020-08-24 DIAGNOSIS — G3184 Mild cognitive impairment, so stated: Secondary | ICD-10-CM | POA: Diagnosis not present

## 2020-08-24 DIAGNOSIS — R768 Other specified abnormal immunological findings in serum: Secondary | ICD-10-CM | POA: Diagnosis not present

## 2020-08-24 DIAGNOSIS — M79646 Pain in unspecified finger(s): Secondary | ICD-10-CM | POA: Diagnosis not present

## 2020-08-24 DIAGNOSIS — M0609 Rheumatoid arthritis without rheumatoid factor, multiple sites: Secondary | ICD-10-CM | POA: Diagnosis not present

## 2020-09-04 ENCOUNTER — Other Ambulatory Visit (INDEPENDENT_AMBULATORY_CARE_PROVIDER_SITE_OTHER): Payer: PPO

## 2020-09-04 ENCOUNTER — Other Ambulatory Visit: Payer: Self-pay

## 2020-09-04 DIAGNOSIS — E782 Mixed hyperlipidemia: Secondary | ICD-10-CM | POA: Diagnosis not present

## 2020-09-04 LAB — ALT: ALT: 20 U/L (ref 0–53)

## 2020-09-04 LAB — LIPID PANEL
Cholesterol: 139 mg/dL (ref 0–200)
HDL: 60.8 mg/dL (ref >39.00)
LDL Cholesterol: 69 mg/dL (ref 0–99)
NonHDL: 77.76
Total CHOL/HDL Ratio: 2
Triglycerides: 45 mg/dL (ref 0.0–149.0)
VLDL: 9 mg/dL (ref 0.0–40.0)

## 2020-09-04 LAB — AST: AST: 21 U/L (ref 0–37)

## 2020-09-08 ENCOUNTER — Ambulatory Visit: Payer: PPO | Admitting: Family Medicine

## 2020-10-12 ENCOUNTER — Other Ambulatory Visit: Payer: Self-pay

## 2020-10-12 ENCOUNTER — Encounter: Payer: Self-pay | Admitting: Family Medicine

## 2020-10-12 ENCOUNTER — Ambulatory Visit (INDEPENDENT_AMBULATORY_CARE_PROVIDER_SITE_OTHER): Payer: PPO | Admitting: Family Medicine

## 2020-10-12 VITALS — BP 110/70 | HR 55 | Temp 98.3°F | Ht 74.0 in | Wt 234.0 lb

## 2020-10-12 DIAGNOSIS — H9201 Otalgia, right ear: Secondary | ICD-10-CM | POA: Diagnosis not present

## 2020-10-12 DIAGNOSIS — Z0000 Encounter for general adult medical examination without abnormal findings: Secondary | ICD-10-CM

## 2020-10-12 NOTE — Progress Notes (Signed)
Troy Vetter T. Susette Seminara, MD, CAQ Sports Medicine  Primary Care and Sports Medicine [facility] at [facility] [address] [place], [address]  Phone: [phone]  FAX: [phone]  Troy [name] - 75 y.o. male  MRN [id number]  Date of Birth: October 28, 1945  Date: 10/12/2020  PCP: Judy Pimple, MD  Referral: Judy Pimple, MD  Chief Complaint  Patient presents with  . Otalgia    Right For about a week.      This visit occurred during the SARS-CoV-2 public health emergency.  Safety protocols were in place, including screening questions prior to the visit, additional usage of staff PPE, and extensive cleaning of exam room while observing appropriate contact time as indicated for disinfecting solutions.   Subjective:   Troy [name] is a 75 y.o. very pleasant male patient with Body mass index is 30.04 kg/m. who presents with the following:  Has been complaining about right ear bothering him for about a week.  He is here with his wife who provides much of the history.  He has been complaining of some ear pain on the right side for about a week.  He has not been swimming.  He has not been otherwise sick.  He denies any facial pain, sinus pain or congestion or jaw pain.  He does have a tooth that is going to be pulled on the lower mandibular region on the right.  Review of Systems is noted in the HPI, as appropriate  Objective:   BP 110/70   Pulse (!) 55   Temp 98.3 F (36.8 C) (Temporal)   Ht 6\' 2"  (1.88 m)   Wt 234 lb (106.1 kg)   SpO2 97%   BMI 30.04 kg/m   GEN: No acute distress; alert,appropriate. PULM: Breathing comfortably in no respiratory distress PSYCH: Normally interactive.  ENT: Both ears are nontender at the pinna, tragus, surrounding tissue.  There are no lymph nodes.  There is some minor amount of wax in the canal only.  TM is visualized bilaterally and appears normal with normal anatomy and normal gray in character. Mouth: I do  not appreciate any abscess or severe pain with oral exam.   Laboratory and Imaging Data:  Assessment and Plan:     ICD-10-CM   1. Right ear pain  H92.01   2. Normal ear, nose and throat exam  Z00.00    His ear exam is normal.  I cannot provoke pain.  Reassurance, I do not think that there is anything additional to do.   Signed,  Elpidio Galea. Latara Micheli, MD   Outpatient Encounter Medications as of 10/12/2020  Medication Sig  . clonazePAM (KLONOPIN) 0.25 MG disintegrating tablet 1 tablet on the tongue and allow to dissolve 30 minutes before bedtime  . Acetaminophen (TYLENOL ARTHRITIS PAIN PO) Take by mouth.  . baricitinib (OLUMIANT) 2 MG TABS tablet Take 2 mg by mouth daily.  . clotrimazole-betamethasone (LOTRISONE) cream Apply 1 application topically 2 (two) times daily as needed.  . donepezil (ARICEPT) 5 MG tablet Take 5 mg by mouth daily.  [name] 575 MG/5ML SYRP Take by mouth.  . folic acid (FOLVITE) 1 MG tablet Take 1 mg by mouth daily.  . memantine (NAMENDA) 10 MG tablet Take 1 tablet (10 mg total) by mouth 2 (two) times daily.  . methotrexate (RHEUMATREX) 2.5 MG tablet Take 8 tablets by mouth once a week.  . mirabegron ER (MYRBETRIQ) 25 MG TB24 tablet Take 25 mg by  mouth daily.  Marland Kitchen nystatin (MYCOSTATIN/NYSTOP) powder Apply 1 application topically 3 (three) times daily as needed. Rash  . rosuvastatin (CRESTOR) 5 MG tablet Take 1 tablet (5 mg total) by mouth daily.  . traZODone (DESYREL) 50 MG tablet Take 1 tablet by mouth daily.  Marland Kitchen venlafaxine (EFFEXOR) 37.5 MG tablet Take 1 tablet by mouth 2 (two) times daily with a meal.  . VITAMIN D PO Take 5,000 Units by mouth daily.  . [DISCONTINUED] fluconazole (DIFLUCAN) 100 MG tablet Take 1 tablet (100 mg total) by mouth daily.   No facility-administered encounter medications on file as of 10/12/2020.

## 2020-11-02 DIAGNOSIS — R413 Other amnesia: Secondary | ICD-10-CM | POA: Diagnosis not present

## 2020-11-02 DIAGNOSIS — H53149 Visual discomfort, unspecified: Secondary | ICD-10-CM | POA: Diagnosis not present

## 2020-11-02 DIAGNOSIS — F413 Other mixed anxiety disorders: Secondary | ICD-10-CM | POA: Diagnosis not present

## 2020-11-02 DIAGNOSIS — R4586 Emotional lability: Secondary | ICD-10-CM | POA: Diagnosis not present

## 2020-11-02 DIAGNOSIS — R519 Headache, unspecified: Secondary | ICD-10-CM | POA: Diagnosis not present

## 2020-11-02 DIAGNOSIS — R296 Repeated falls: Secondary | ICD-10-CM | POA: Diagnosis not present

## 2020-11-16 ENCOUNTER — Telehealth: Payer: Self-pay

## 2020-11-16 DIAGNOSIS — Z0279 Encounter for issue of other medical certificate: Secondary | ICD-10-CM

## 2020-11-16 NOTE — Telephone Encounter (Signed)
Semi completed long term disability paperwork placed in PCP's inbox for review, completion, sign and date

## 2020-11-16 NOTE — Telephone Encounter (Signed)
Spoke with wife to inform family that paperwork is completed and faxed  Copy for pt pick up   Copy for billing   Copy for scan   Copy retained by me

## 2020-11-16 NOTE — Telephone Encounter (Signed)
Done and in IN box 

## 2020-11-23 DIAGNOSIS — Z79899 Other long term (current) drug therapy: Secondary | ICD-10-CM | POA: Diagnosis not present

## 2020-11-23 DIAGNOSIS — M1990 Unspecified osteoarthritis, unspecified site: Secondary | ICD-10-CM | POA: Diagnosis not present

## 2020-11-23 DIAGNOSIS — Z13820 Encounter for screening for osteoporosis: Secondary | ICD-10-CM | POA: Diagnosis not present

## 2020-11-23 DIAGNOSIS — M79646 Pain in unspecified finger(s): Secondary | ICD-10-CM | POA: Diagnosis not present

## 2020-11-23 DIAGNOSIS — R768 Other specified abnormal immunological findings in serum: Secondary | ICD-10-CM | POA: Diagnosis not present

## 2020-11-23 DIAGNOSIS — M25511 Pain in right shoulder: Secondary | ICD-10-CM | POA: Diagnosis not present

## 2020-11-23 DIAGNOSIS — G3184 Mild cognitive impairment, so stated: Secondary | ICD-10-CM | POA: Diagnosis not present

## 2020-11-23 DIAGNOSIS — M0609 Rheumatoid arthritis without rheumatoid factor, multiple sites: Secondary | ICD-10-CM | POA: Diagnosis not present

## 2020-11-23 NOTE — Telephone Encounter (Signed)
Troy [name] From [employer] called stating they received FMLA paperwork but it mentioned office visit for the patient in February 2022 and the notes they received were only until 02/11/2020. They need office visit notes that are available until present time up to 10/31/20. They did send medical records request on 10/31/20 requesting this. Please update them on how soon this would be sent to them so they can update their document. Phone: [phone]  Reference # [id number]  Do not have to ask for a particular person just give the reference number.

## 2020-11-23 NOTE — Telephone Encounter (Signed)
2021 correction to last note

## 2020-11-23 NOTE — Telephone Encounter (Signed)
Called [employer] to inform that a request was sent to medical records and that it may take 1-2 weeks to get processed

## 2020-11-23 NOTE — Telephone Encounter (Signed)
Sent request for records to medical records for [employer] from 9/20221 to date

## 2020-12-05 ENCOUNTER — Telehealth: Payer: Self-pay | Admitting: Family Medicine

## 2020-12-05 NOTE — Telephone Encounter (Signed)
He has not had a physical in a while - but also goes to the [place] Would like to get a PE with lab prior in the next several months if possible unless all that was done at the [place]

## 2020-12-05 NOTE — Telephone Encounter (Signed)
Not sure when pt is due for labs. ? If he is due for labs only or if he is due for a f/u with labs prior or CPE, will route to PCP for review

## 2020-12-05 NOTE — Telephone Encounter (Signed)
Patient wife came in requesting a call back from the nurse to know when patient needs to have labs done

## 2021-01-01 ENCOUNTER — Telehealth: Payer: Self-pay | Admitting: Family Medicine

## 2021-01-01 DIAGNOSIS — R7303 Prediabetes: Secondary | ICD-10-CM

## 2021-01-01 DIAGNOSIS — N401 Enlarged prostate with lower urinary tract symptoms: Secondary | ICD-10-CM

## 2021-01-01 DIAGNOSIS — E782 Mixed hyperlipidemia: Secondary | ICD-10-CM

## 2021-01-01 DIAGNOSIS — E559 Vitamin D deficiency, unspecified: Secondary | ICD-10-CM

## 2021-01-01 DIAGNOSIS — Z0000 Encounter for general adult medical examination without abnormal findings: Secondary | ICD-10-CM

## 2021-01-01 NOTE — Telephone Encounter (Signed)
-----   Message from Tamera L Johnson, RT sent at 12/18/2020  9:54 AM EDT ----- Regarding: Lab Orders for Tuesday 8.23.2022 Please place lab orders for Tuesday 8.23.2022, office visit for physical on Tuesday 8.30.2022 Thank you, Tamera Johnson RT(R)       

## 2021-01-02 ENCOUNTER — Other Ambulatory Visit (INDEPENDENT_AMBULATORY_CARE_PROVIDER_SITE_OTHER): Payer: PPO

## 2021-01-02 ENCOUNTER — Other Ambulatory Visit: Payer: Self-pay

## 2021-01-02 DIAGNOSIS — R7303 Prediabetes: Secondary | ICD-10-CM

## 2021-01-02 DIAGNOSIS — E782 Mixed hyperlipidemia: Secondary | ICD-10-CM

## 2021-01-02 DIAGNOSIS — R350 Frequency of micturition: Secondary | ICD-10-CM

## 2021-01-02 DIAGNOSIS — N401 Enlarged prostate with lower urinary tract symptoms: Secondary | ICD-10-CM

## 2021-01-02 DIAGNOSIS — Z0000 Encounter for general adult medical examination without abnormal findings: Secondary | ICD-10-CM | POA: Diagnosis not present

## 2021-01-02 DIAGNOSIS — E559 Vitamin D deficiency, unspecified: Secondary | ICD-10-CM | POA: Diagnosis not present

## 2021-01-02 LAB — LIPID PANEL
Cholesterol: 139 mg/dL (ref 0–200)
HDL: 55.4 mg/dL (ref >39.00)
LDL Cholesterol: 73 mg/dL (ref 0–99)
NonHDL: 83.26
Total CHOL/HDL Ratio: 3
Triglycerides: 50 mg/dL (ref 0.0–149.0)
VLDL: 10 mg/dL (ref 0.0–40.0)

## 2021-01-02 LAB — CBC WITH DIFFERENTIAL/PLATELET
Basophils Absolute: 0 10*3/uL (ref 0.0–0.1)
Basophils Relative: 0.4 % (ref 0.0–3.0)
Eosinophils Absolute: 0.1 10*3/uL (ref 0.0–0.7)
Eosinophils Relative: 1.4 % (ref 0.0–5.0)
HCT: 41 % (ref 39.0–52.0)
Hemoglobin: 14 g/dL (ref 13.0–17.0)
Lymphocytes Relative: 15.9 % (ref 12.0–46.0)
Lymphs Abs: 0.8 10*3/uL (ref 0.7–4.0)
MCHC: 34.1 g/dL (ref 30.0–36.0)
MCV: 92.4 fl (ref 78.0–100.0)
Monocytes Absolute: 0.4 10*3/uL (ref 0.1–1.0)
Monocytes Relative: 8.4 % (ref 3.0–12.0)
Neutro Abs: 3.5 10*3/uL (ref 1.4–7.7)
Neutrophils Relative %: 73.9 % (ref 43.0–77.0)
Platelets: 168 10*3/uL (ref 150.0–400.0)
RBC: 4.44 Mil/uL (ref 4.22–5.81)
RDW: 13.5 % (ref 11.5–15.5)
WBC: 4.8 10*3/uL (ref 4.0–10.5)

## 2021-01-02 LAB — VITAMIN D 25 HYDROXY (VIT D DEFICIENCY, FRACTURES): VITD: 30.91 ng/mL (ref 30.00–100.00)

## 2021-01-02 LAB — COMPREHENSIVE METABOLIC PANEL
ALT: 42 U/L (ref 0–53)
AST: 38 U/L — ABNORMAL HIGH (ref 0–37)
Albumin: 3.9 g/dL (ref 3.5–5.2)
Alkaline Phosphatase: 57 U/L (ref 39–117)
BUN: 22 mg/dL (ref 6–23)
CO2: 31 mEq/L (ref 19–32)
Calcium: 8.9 mg/dL (ref 8.4–10.5)
Chloride: 105 mEq/L (ref 96–112)
Creatinine, Ser: 1.01 mg/dL (ref 0.40–1.50)
GFR: 72.89 mL/min (ref >60.00)
Glucose, Bld: 91 mg/dL (ref 70–99)
Potassium: 4.1 mEq/L (ref 3.5–5.1)
Sodium: 141 mEq/L (ref 135–145)
Total Bilirubin: 0.6 mg/dL (ref 0.2–1.2)
Total Protein: 5.6 g/dL — ABNORMAL LOW (ref 6.0–8.3)

## 2021-01-02 LAB — PSA, MEDICARE: PSA: 1.39 ng/ml (ref 0.10–4.00)

## 2021-01-02 LAB — HEMOGLOBIN A1C: Hgb A1c MFr Bld: 5.7 % (ref 4.6–6.5)

## 2021-01-02 LAB — TSH: TSH: 2.24 u[IU]/mL (ref 0.35–5.50)

## 2021-01-09 ENCOUNTER — Other Ambulatory Visit: Payer: Self-pay

## 2021-01-09 ENCOUNTER — Ambulatory Visit (INDEPENDENT_AMBULATORY_CARE_PROVIDER_SITE_OTHER): Payer: PPO | Admitting: Family Medicine

## 2021-01-09 ENCOUNTER — Encounter: Payer: Self-pay | Admitting: Family Medicine

## 2021-01-09 VITALS — BP 126/68 | HR 59 | Temp 98.1°F | Ht 73.5 in | Wt 228.0 lb

## 2021-01-09 DIAGNOSIS — N401 Enlarged prostate with lower urinary tract symptoms: Secondary | ICD-10-CM

## 2021-01-09 DIAGNOSIS — R350 Frequency of micturition: Secondary | ICD-10-CM

## 2021-01-09 DIAGNOSIS — B356 Tinea cruris: Secondary | ICD-10-CM | POA: Diagnosis not present

## 2021-01-09 DIAGNOSIS — M0609 Rheumatoid arthritis without rheumatoid factor, multiple sites: Secondary | ICD-10-CM | POA: Diagnosis not present

## 2021-01-09 DIAGNOSIS — Z0000 Encounter for general adult medical examination without abnormal findings: Secondary | ICD-10-CM

## 2021-01-09 DIAGNOSIS — F0390 Unspecified dementia without behavioral disturbance: Secondary | ICD-10-CM

## 2021-01-09 DIAGNOSIS — Z23 Encounter for immunization: Secondary | ICD-10-CM

## 2021-01-09 DIAGNOSIS — E559 Vitamin D deficiency, unspecified: Secondary | ICD-10-CM

## 2021-01-09 DIAGNOSIS — R3129 Other microscopic hematuria: Secondary | ICD-10-CM | POA: Diagnosis not present

## 2021-01-09 DIAGNOSIS — E782 Mixed hyperlipidemia: Secondary | ICD-10-CM | POA: Diagnosis not present

## 2021-01-09 DIAGNOSIS — F419 Anxiety disorder, unspecified: Secondary | ICD-10-CM | POA: Diagnosis not present

## 2021-01-09 DIAGNOSIS — R7303 Prediabetes: Secondary | ICD-10-CM

## 2021-01-09 MED ORDER — MEMANTINE HCL 10 MG PO TABS: 10.0000 mg | ORAL_TABLET | Freq: Two times a day (BID) | ORAL | 5 refills | Status: AC

## 2021-01-09 MED ORDER — CLOTRIMAZOLE-BETAMETHASONE 1-0.05 % EX CREA: 1.0000 "application " | TOPICAL_CREAM | Freq: Two times a day (BID) | CUTANEOUS | 1 refills | Status: DC | PRN

## 2021-01-09 MED ORDER — NYSTATIN 100000 UNIT/GM EX POWD: 1.0000 "application " | Freq: Three times a day (TID) | CUTANEOUS | 2 refills | Status: DC | PRN

## 2021-01-09 NOTE — Assessment & Plan Note (Signed)
Reviewed health habits including diet and exercise and skin cancer prevention Reviewed appropriate screening tests for age  Also reviewed health mt list, fam hx and immunization status , as well as social and family history   See HPI Labs reviewed Flu shot today  Interested in shingrix, will check on coverage One fall,no fx, nl dexa per pt and getting back on vit D For fall prev discussed non slip socks and repl tub with walk in shower with bars Exercise as tolerated psa reviewed /BPH noted Pt decided to decline all colon cancer screening  Given materials to work on Scientist, product/process development  Cog status- followed for advancing dementia by [place] and neuro and taking namenda Hearing screen is reassuring and utd vision/eye care  Rev PHQ from June, stable with current tx Rev ADLs-wife helps with meal prep

## 2021-01-09 NOTE — Assessment & Plan Note (Signed)
Disc goals for lipids and reasons to control them Rev last labs with pt Rev low sat fat diet in detail Plan to continue crestor 5 mg daily

## 2021-01-09 NOTE — Assessment & Plan Note (Signed)
Methotrexate baricitinib  Rheumatology follows  Doing well/stable  Labs are stable

## 2021-01-09 NOTE — Assessment & Plan Note (Signed)
In setting of dementia  Now on trazodone 50 mg daily and effexor 37.5 mg bid and clonazepam prn  Doing much better  Continues neuro f/u (Dr Malvin Johns)

## 2021-01-09 NOTE — Patient Instructions (Addendum)
If you are interested in the new shingles vaccine (Shingrix) - call your local pharmacy to check on coverage and availability  If affordable, get on a wait list at your pharmacy to get the vaccine.   Get some slip proof socks  When you get deck painted/stained- think about adding grit to make it less slippery  A walk in shower with bars would be a good idea   Get back on 5000 iu of vitamin D daily   Use the lostrisone cream when jock itch flares  Use the nystatin powder in between flares   Take care  Be as active as you can (safely)   Work on advance directive with the blue hand out   Flu shot today

## 2021-01-09 NOTE — Assessment & Plan Note (Signed)
Continues care here and with neurology  Plan to continue namenda 10 mg bid  Also for mood Trazodone 50 mg daily  effexor 37.5 mg bid  Klonopin prn  Keeping a routine Less agitated , less accidents Wife is doing well caring for him at home

## 2021-01-09 NOTE — Assessment & Plan Note (Signed)
Continues urology f/u Lab Results  Component Value Date   PSA 1.39 01/02/2021   PSA 1.65 01/01/2017   PSA 1.18 12/06/2013    Taking myrbetriq and this has helped much with frequency

## 2021-01-09 NOTE — Progress Notes (Signed)
Subjective:    Patient ID: Troy [name], male    DOB: Nov 05, 1945, 75 y.o.   MRN: [id number]  This visit occurred during the SARS-CoV-2 public health emergency.  Safety protocols were in place, including screening questions prior to the visit, additional usage of staff PPE, and extensive cleaning of exam room while observing appropriate contact time as indicated for disinfecting solutions.   HPI Pt presents for amw and health mt visit  I have personally reviewed the Medicare Annual Wellness questionnaire and have noted 1. The patient's medical and social history 2. Their use of alcohol, tobacco or illicit drugs 3. Their current medications and supplements 4. The patient's functional ability including ADL's, fall risks, home safety risks and hearing or visual             impairment. 5. Diet and physical activities 6. Evidence for depression or mood disorders  The patients weight, height, BMI have been recorded in the chart and visual acuity is per eye clinic.  I have made referrals, counseling and provided education to the patient based review of the above and I have provided the pt with a written personalized care plan for preventive services. Reviewed and updated provider list, see scanned forms.  Goes to [place] also   See scanned forms.  Routine anticipatory guidance given to patient.  See health maintenance. Colon cancer screening  colonoscopy 4/10 with 5 y recall (father had colon cancer)  Colonoscopy -decided not to do  Declines cologuard or other screening also   Flu vaccine- today  Tetanus vaccine - postponed for ins, last 12/21 Td Covid immunized Pneumovax-completed  Zoster vaccine- interested in shingrix if covered  Falls-fell a week ago taking the trash out /no injuries  Needs slip proof socks  Dexa was good at rheumatology  Troy [name]  Supplements vit D3 5000 iu daily  Vit D level 30.9  Exercise -regular activities /less with the heat , walking   Prostate  cancer screening Has BPH Urology visit in march- with 6 mo recall Px myrbetriq  Lab Results  Component Value Date   PSA 1.39 01/02/2021   PSA 1.65 01/01/2017   PSA 1.18 12/06/2013  Stable psa    Advance directive- given materials to work on  Cognitive function addressed- see scanned forms- and if abnormal then additional documentation follows.  H/o progressive dementia  Sees neurology :  Taking namenda 10 mg bid Trazodone 50 mg daily  Effexor 37.5 bid  Per wife fairly stable  Short term memory is better  Anti depressants are helping much with mood and agitation  Has long term disability now - the [place] is helping with compensation  Also handicapped plate   PMH and SH reviewed  Meds, vitals, and allergies reviewed.   ROS: See HPI.  Otherwise negative.    Weight : Wt Readings from Last 3 Encounters:  01/09/21 228 lb (103.4 kg)  10/12/20 234 lb (106.1 kg)  06/20/20 225 lb 6 oz (102.2 kg)   29.67 kg/m Down 6 lb  Appetite is very good  Snacking    Hearing/vision: Hearing Screening   500Hz  1000Hz  2000Hz  4000Hz   Right ear 40 40 40 0  Left ear 40 0 40 40  Vision Screening - Comments:: March 2022 [name]   [facility] Depression screen [facility] 2/9 10/12/2020 09/24/2019 01/01/2017  Decreased Interest 1 0 0  Down, Depressed, Hopeless 1 0 0  PHQ - 2 Score 2 0 0  Altered sleeping 0 - 1  Tired, decreased  energy 0 - 0  Change in appetite 1 - 0  Feeling bad or failure about yourself  0 - 0  Trouble concentrating 0 - 1  Moving slowly or fidgety/restless 1 - 0  Suicidal thoughts 0 - 0  PHQ-9 Score 4 - 2  Difficult doing work/chores Extremely dIfficult - Not difficult at all     activities of daily living- does not prep own food (wife always has)  Can fix a bowl of cereal  Wife wants him to be independent  Can bathe if reminded / personal hygiene is good    Care team Troy [name]-pcp Patel-neuro Shah-neuro Potter-neuro   BP Readings from Last 3 Encounters:   01/09/21 126/68  10/12/20 110/70  06/20/20 108/66   Pulse Readings from Last 3 Encounters:  01/09/21 (!) 59  10/12/20 (!) 55  06/20/20 (!) 58    Prediabetes Lab Results  Component Value Date   HGBA1C 5.7 01/02/2021   Hyperlipidemia  Lab Results  Component Value Date   CHOL 139 01/02/2021   CHOL 139 09/04/2020   CHOL 200 01/01/2017   Lab Results  Component Value Date   HDL 55.40 01/02/2021   HDL 60.80 09/04/2020   HDL 57.50 01/01/2017   Lab Results  Component Value Date   LDLCALC 73 01/02/2021   LDLCALC 69 09/04/2020   LDLCALC 132 (H) 01/01/2017   Lab Results  Component Value Date   TRIG 50.0 01/02/2021   TRIG 45.0 09/04/2020   TRIG 56.0 01/01/2017   Lab Results  Component Value Date   CHOLHDL 3 01/02/2021   CHOLHDL 2 09/04/2020   CHOLHDL 3 01/01/2017   Lab Results  Component Value Date   LDLDIRECT 136.9 07/19/2009    Crestor 5 mg daily   RA- stable , doing well   Goes to ball games with grand babies  Not as active   Other labs  Lab Results  Component Value Date   CREATININE 1.01 01/02/2021   BUN 22 01/02/2021   NA 141 01/02/2021   K 4.1 01/02/2021   CL 105 01/02/2021   CO2 31 01/02/2021   Lab Results  Component Value Date   WBC 4.8 01/02/2021   HGB 14.0 01/02/2021   HCT 41.0 01/02/2021   MCV 92.4 01/02/2021   PLT 168.0 01/02/2021   Lab Results  Component Value Date   TSH 2.24 01/02/2021     Patient Active Problem List   Diagnosis Date Noted   Medicare annual wellness visit, subsequent 01/09/2021   Tinea cruris 06/20/2020   Mesenteric lymphadenopathy 06/15/2020   Dementia (HCC) 02/11/2020   Constipation 11/23/2019   BPH (benign prostatic hyperplasia) 11/23/2019   Microscopic hematuria 11/23/2019   Abnormal CXR 11/04/2019   Vitamin D deficiency 09/24/2019   Rheumatoid arthritis (HCC) 09/14/2019   Mild dehydration 09/14/2019   Positive ANA (antinuclear antibody) 03/21/2019   Anxiety 06/09/2015   Routine general medical  examination at a health care facility 12/10/2013   Family history of colon cancer 12/10/2013   Prostate cancer screening 12/05/2013   Allergic rhinitis 10/18/2013   ROTATOR CUFF INJURY, LEFT SHOULDER 04/30/2010   Prediabetes 08/21/2009   HYPERLIPIDEMIA 04/20/2007   OSTEOARTHRITIS 02/09/2007   No past medical history on file. Past Surgical History:  Procedure Laterality Date   BACK SURGERY  04/1998   LIPOMA EXCISION  09/2009   ROTATOR CUFF REPAIR Left 2012   TONSILLECTOMY AND ADENOIDECTOMY  age 18   Social History   Tobacco Use   Smoking status: Never  Smokeless tobacco: Never  Substance Use Topics   Alcohol use: No    Comment: quit over 39 years ago   Drug use: No   Family History  Problem Relation Age of Onset   Dementia Mother    Colon cancer Father 50   Allergies  Allergen Reactions   Chlorzoxazone     REACTION: dizzy   Elemental Sulfur Rash   Current Outpatient Medications on File Prior to Visit  Medication Sig Dispense Refill   Acetaminophen (TYLENOL ARTHRITIS PAIN PO) Take by mouth.     baricitinib (OLUMIANT) 2 MG TABS tablet Take 2 mg by mouth daily.     clonazePAM (KLONOPIN) 0.25 MG disintegrating tablet 1 tablet on the tongue and allow to dissolve 30 minutes before bedtime     Elderberry 575 MG/5ML SYRP Take by mouth.     folic acid (FOLVITE) 1 MG tablet Take 1 mg by mouth daily.     methotrexate (RHEUMATREX) 2.5 MG tablet Take 8 tablets by mouth once a week.     mirabegron ER (MYRBETRIQ) 25 MG TB24 tablet Take 25 mg by mouth daily.     rosuvastatin (CRESTOR) 5 MG tablet Take 1 tablet (5 mg total) by mouth daily. 90 tablet 3   traZODone (DESYREL) 50 MG tablet Take 1 tablet by mouth daily.     venlafaxine (EFFEXOR) 37.5 MG tablet Take 1 tablet by mouth 2 (two) times daily with a meal.     VITAMIN D PO Take 5,000 Units by mouth daily.     No current facility-administered medications on file prior to visit.    Review of Systems  Constitutional:  Negative  for activity change, appetite change, fatigue, fever and unexpected weight change.  HENT:  Negative for congestion, rhinorrhea, sore throat and trouble swallowing.   Eyes:  Negative for pain, redness, itching and visual disturbance.  Respiratory:  Negative for cough, chest tightness, shortness of breath and wheezing.   Cardiovascular:  Negative for chest pain and palpitations.  Gastrointestinal:  Negative for abdominal pain, blood in stool, constipation, diarrhea and nausea.  Endocrine: Negative for cold intolerance, heat intolerance, polydipsia and polyuria.  Genitourinary:  Negative for difficulty urinating, dysuria, frequency and urgency.  Musculoskeletal:  Negative for arthralgias, joint swelling and myalgias.  Skin:  Negative for pallor and rash.  Neurological:  Negative for dizziness, tremors, weakness, numbness and headaches.  Hematological:  Negative for adenopathy. Does not bruise/bleed easily.  Psychiatric/Behavioral:  Positive for confusion and decreased concentration. Negative for agitation and dysphoric mood. The patient is not nervous/anxious.       Objective:   Physical Exam Constitutional:      General: He is not in acute distress.    Appearance: Normal appearance. He is well-developed. He is not ill-appearing or diaphoretic.     Comments: Over weight   HENT:     Head: Normocephalic and atraumatic.     Right Ear: Tympanic membrane, ear canal and external ear normal.     Left Ear: Tympanic membrane, ear canal and external ear normal.     Nose: Nose normal. No congestion.     Mouth/Throat:     Mouth: Mucous membranes are moist.     Pharynx: Oropharynx is clear. No posterior oropharyngeal erythema.  Eyes:     General: No scleral icterus.       Right eye: No discharge.        Left eye: No discharge.     Conjunctiva/sclera: Conjunctivae normal.     Pupils:  Pupils are equal, round, and reactive to light.  Neck:     Thyroid: No thyromegaly.     Vascular: No carotid bruit  or JVD.  Cardiovascular:     Rate and Rhythm: Normal rate and regular rhythm.     Pulses: Normal pulses.     Heart sounds: Normal heart sounds.    No gallop.  Pulmonary:     Effort: Pulmonary effort is normal. No respiratory distress.     Breath sounds: Normal breath sounds. No wheezing or rales.     Comments: Good air exch Chest:     Chest wall: No tenderness.  Abdominal:     General: Bowel sounds are normal. There is no distension or abdominal bruit.     Palpations: Abdomen is soft. There is no mass.     Tenderness: There is no abdominal tenderness.     Hernia: No hernia is present.  Musculoskeletal:        General: No tenderness.     Cervical back: Normal range of motion and neck supple. No rigidity. No muscular tenderness.     Right lower leg: No edema.     Left lower leg: No edema.     Comments: No new joint changes   Lymphadenopathy:     Cervical: No cervical adenopathy.  Skin:    General: Skin is warm and dry.     Coloration: Skin is not pale.     Findings: No erythema or rash.     Comments: Solar lentigines diffusely Some sks on trunk/back  Neurological:     Mental Status: He is alert.     Cranial Nerves: No cranial nerve deficit.     Motor: No abnormal muscle tone.     Coordination: Coordination normal.     Gait: Gait normal.     Deep Tendon Reflexes: Reflexes are normal and symmetric. Reflexes normal.     Comments: Wide based gait   No tremor    Psychiatric:        Mood and Affect: Mood normal.        Speech: Speech normal.        Behavior: Behavior normal.        Cognition and Memory: Cognition is impaired. He exhibits impaired recent memory.     Comments: Baseline cognitive impairment Repeats himself  Pleasant           Assessment & Plan:   Problem List Items Addressed This Visit       Nervous and Auditory   Dementia (HCC)    Continues care here and with neurology  Plan to continue namenda 10 mg bid  Also for mood Trazodone 50 mg daily   effexor 37.5 mg bid  Klonopin prn  Keeping a routine Less agitated , less accidents Wife is doing well caring for him at home       Relevant Medications   memantine (NAMENDA) 10 MG tablet     Musculoskeletal and Integument   Rheumatoid arthritis (HCC)    Methotrexate baricitinib  Rheumatology follows  Doing well/stable  Labs are stable       Tinea cruris    Continues to wax and want Has lotrisone cream for acute flares Nystatin powder in between  Enc to keep area clean and dry  Continue to update prn      Relevant Medications   nystatin (MYCOSTATIN/NYSTOP) powder   clotrimazole-betamethasone (LOTRISONE) cream     Genitourinary   BPH (benign prostatic hyperplasia)    Continues  urology f/u Lab Results  Component Value Date   PSA 1.39 01/02/2021   PSA 1.65 01/01/2017   PSA 1.18 12/06/2013    Taking myrbetriq and this has helped much with frequency        Microscopic hematuria    Followed by urology  Last visit 3/22 Doing well  Imaging was reassuring         Other   HYPERLIPIDEMIA    Disc goals for lipids and reasons to control them Rev last labs with pt Rev low sat fat diet in detail Plan to continue crestor 5 mg daily      Prediabetes    Lab Results  Component Value Date   HGBA1C 5.7 01/02/2021  Not on prednisone currently  Good appetite Continue to follow       Routine general medical examination at a health care facility    Reviewed health habits including diet and exercise and skin cancer prevention Reviewed appropriate screening tests for age  Also reviewed health mt list, fam hx and immunization status , as well as social and family history   See HPI Labs reviewed Flu shot today  Interested in shingrix, will check on coverage One fall,no fx, nl dexa per pt and getting back on vit D For fall prev discussed non slip socks and repl tub with walk in shower with bars Exercise as tolerated psa reviewed /BPH noted Pt decided to decline  all colon cancer screening  Given materials to work on [employer] directive  Cog status- followed for advancing dementia by [place] and neuro and taking namenda Hearing screen is reassuring and utd vision/eye care  Rev PHQ from June, stable with current tx Rev ADLs-wife helps with meal prep      Anxiety    In setting of dementia  Now on trazodone 50 mg daily and effexor 37.5 mg bid and clonazepam prn  Doing much better  Continues neuro f/u (Dr Malvin Johns)      Vitamin D deficiency    Stopped vit D for the summer and now level is 30.9  Disc imp for bone and overall health inst to go back to 5000 iu daily around the calender   Per pt -nl dexa with rheumatology which is reassuring      Medicare annual wellness visit, subsequent - Primary    Reviewed health habits including diet and exercise and skin cancer prevention Reviewed appropriate screening tests for age  Also reviewed health mt list, fam hx and immunization status , as well as social and family history   See HPI Labs reviewed Flu shot today  Interested in shingrix, will check on coverage One fall,no fx, nl dexa per pt and getting back on vit D For fall prev discussed non slip socks and repl tub with walk in shower with bars Exercise as tolerated psa reviewed /BPH noted Pt decided to decline all colon cancer screening  Given materials to work on Scientist, product/process development  Cog status- followed for advancing dementia by [place] and neuro and taking namenda Hearing screen is reassuring and utd vision/eye care  Rev PHQ from June, stable with current tx Rev ADLs-wife helps with meal prep      Other Visit Diagnoses     Need for influenza vaccination       Relevant Orders   Flu Vaccine QUAD High Dose(Fluad) (Completed)

## 2021-01-09 NOTE — Assessment & Plan Note (Signed)
Lab Results  Component Value Date   HGBA1C 5.7 01/02/2021   Not on prednisone currently  Good appetite Continue to follow

## 2021-01-09 NOTE — Assessment & Plan Note (Signed)
Continues to wax and want Has lotrisone cream for acute flares Nystatin powder in between  Enc to keep area clean and dry  Continue to update prn

## 2021-01-09 NOTE — Assessment & Plan Note (Signed)
Reviewed health habits including diet and exercise and skin cancer prevention Reviewed appropriate screening tests for age  Also reviewed health mt list, fam hx and immunization status , as well as social and family history   See HPI Labs reviewed Flu shot today  Interested in shingrix, will check on coverage One fall,no fx, nl dexa per pt and getting back on vit D For fall prev discussed non slip socks and repl tub with walk in shower with bars Exercise as tolerated psa reviewed /BPH noted Pt decided to decline all colon cancer screening  Given materials to work on adv directive  Cog status- followed for advancing dementia by us and neuro and taking namenda Hearing screen is reassuring and utd vision/eye care  Rev PHQ from June, stable with current tx Rev ADLs-wife helps with meal prep 

## 2021-01-09 NOTE — Assessment & Plan Note (Signed)
Followed by urology  Last visit 3/22 Doing well  Imaging was reassuring

## 2021-01-09 NOTE — Assessment & Plan Note (Addendum)
Stopped vit D for the summer and now level is 30.9  Disc imp for bone and overall health inst to go back to 5000 iu daily around the calender   Per pt -nl dexa with rheumatology which is reassuring

## 2021-01-17 DIAGNOSIS — R351 Nocturia: Secondary | ICD-10-CM | POA: Diagnosis not present

## 2021-01-17 DIAGNOSIS — R350 Frequency of micturition: Secondary | ICD-10-CM | POA: Diagnosis not present

## 2021-02-05 ENCOUNTER — Telehealth: Payer: Self-pay | Admitting: Family Medicine

## 2021-02-05 NOTE — Telephone Encounter (Signed)
Aware, will see him as planned.  If suddenly worse advise ER

## 2021-02-05 NOTE — Telephone Encounter (Signed)
Spoke to patient's wife by telephone and was advised that her husband seems to be doing a little better now. Patient's wife stated that he is weak, has a productive cough at times/clear, and a low grade fever. Mrs. Craver stated that her husband is eating and drinking fine. Mrs. Lomax stated that her husband is urinating as usual and his urine is clear and he does not appear to be dehydrated. Patient's wife stated that his temperature is 98.4. heart rate 76 and oxygen level 100. Patient scheduled for a virtual visit in the morning 02/06/21 at 8:00 am with Dr. Milinda Antis.  Patient's wife stated that her daughter is a Engineer, civil (consulting) and is there now helping her out. Patient's wife was given ER precautions and she verbalized understanding.

## 2021-02-06 ENCOUNTER — Encounter: Payer: Self-pay | Admitting: Family Medicine

## 2021-02-06 ENCOUNTER — Telehealth (INDEPENDENT_AMBULATORY_CARE_PROVIDER_SITE_OTHER): Payer: PPO | Admitting: Family Medicine

## 2021-02-06 ENCOUNTER — Other Ambulatory Visit: Payer: Self-pay

## 2021-02-06 DIAGNOSIS — U071 COVID-19: Secondary | ICD-10-CM | POA: Diagnosis not present

## 2021-02-06 MED ORDER — MOLNUPIRAVIR EUA 200MG CAPSULE: 4.0000 | ORAL_CAPSULE | Freq: Two times a day (BID) | ORAL | 0 refills | Status: AC

## 2021-02-06 NOTE — Progress Notes (Signed)
Virtual Visit via Video Note  I connected with Troy [name] on 02/06/21 at  8:00 AM EDT by a video enabled telemedicine application and verified that I am speaking with the correct person using two identifiers.  Location: Patient: home Provider: office   I discussed the limitations of evaluation and management by telemedicine and the availability of in person appointments. The patient expressed understanding and agreed to proceed.  Parties involved in encounter  Patient: Troy [name] Spouse: [name]  Provider:  Roxy Manns MD   History of Present Illness: Pt presents with uri symptoms and positive covid test   He has a h/o dementia and RA taking immunosuppressant tx Pulse ox today on RA is 96%  Pt and wife both have covid  Her symptoms started on Saturday (headache on and off) , then st the next weekend with mild cough -got tested/positive  The next night (sat) he had cough , and did a home test and it was positive on Sunday  He got lethargic later in the day , stayed on the couch  Felt unsteady  Called paramedics- good vitals and not dehydrated (good) That night he fell in the bathroom and had to call again to get him up and looked him over again  Now cough is worse , periodically has low grade temp (100 or below) and nl pulse ox   Runny nose No ST Cough is productive - phlegm is clear  No wheeze or sob   No nausea/vomiting or diarrhea  Is eating fairly well and drinking lots of fluids    Has re gained some strength  Refuses to use bedside commode  Gets back and forth to the bathroom A little constipated   Otc : mucinex DM  Held his RA   Daughter (nurse) helping with care /bathing etc    Imm status- covid immunized with booster  Lab Results  Component Value Date   WBC 4.8 01/02/2021   HGB 14.0 01/02/2021   HCT 41.0 01/02/2021   MCV 92.4 01/02/2021   PLT 168.0 01/02/2021   Lab Results  Component Value Date   CREATININE 1.01 01/02/2021   BUN 22  01/02/2021   NA 141 01/02/2021   K 4.1 01/02/2021   CL 105 01/02/2021   CO2 31 01/02/2021   Patient Active Problem List   Diagnosis Date Noted   COVID-19 02/06/2021   Medicare annual wellness visit, subsequent 01/09/2021   Tinea cruris 06/20/2020   Mesenteric lymphadenopathy 06/15/2020   Dementia (HCC) 02/11/2020   Constipation 11/23/2019   BPH (benign prostatic hyperplasia) 11/23/2019   Microscopic hematuria 11/23/2019   Abnormal CXR 11/04/2019   Vitamin D deficiency 09/24/2019   Rheumatoid arthritis (HCC) 09/14/2019   Mild dehydration 09/14/2019   Positive ANA (antinuclear antibody) 03/21/2019   Anxiety 06/09/2015   Routine general medical examination at a health care facility 12/10/2013   Family history of colon cancer 12/10/2013   Prostate cancer screening 12/05/2013   Allergic rhinitis 10/18/2013   ROTATOR CUFF INJURY, LEFT SHOULDER 04/30/2010   Prediabetes 08/21/2009   HYPERLIPIDEMIA 04/20/2007   OSTEOARTHRITIS 02/09/2007   History reviewed. No pertinent past medical history. Past Surgical History:  Procedure Laterality Date   BACK SURGERY  04/1998   LIPOMA EXCISION  09/2009   ROTATOR CUFF REPAIR Left 2012   TONSILLECTOMY AND ADENOIDECTOMY  age 36   Social History   Tobacco Use   Smoking status: Never   Smokeless tobacco: Never  Substance Use Topics   Alcohol use:  No    Comment: quit over 39 years ago   Drug use: No   Family History  Problem Relation Age of Onset   Dementia Mother    Colon cancer Father 59   Allergies  Allergen Reactions   Chlorzoxazone     REACTION: dizzy   Elemental Sulfur Rash   Current Outpatient Medications on File Prior to Visit  Medication Sig Dispense Refill   Acetaminophen (TYLENOL ARTHRITIS PAIN PO) Take by mouth.     baricitinib (OLUMIANT) 2 MG TABS tablet Take 2 mg by mouth daily.     clonazePAM (KLONOPIN) 0.25 MG disintegrating tablet 1 tablet on the tongue and allow to dissolve 30 minutes before bedtime      clotrimazole-betamethasone (LOTRISONE) cream Apply 1 application topically 2 (two) times daily as needed. For jock itch flare for 10 days 30 g 1   Elderberry 575 MG/5ML SYRP Take by mouth.     folic acid (FOLVITE) 1 MG tablet Take 1 mg by mouth daily.     memantine (NAMENDA) 10 MG tablet Take 1 tablet (10 mg total) by mouth 2 (two) times daily. 60 tablet 5   methotrexate (RHEUMATREX) 2.5 MG tablet Take 8 tablets by mouth once a week.     mirabegron ER (MYRBETRIQ) 25 MG TB24 tablet Take 25 mg by mouth daily.     nystatin (MYCOSTATIN/NYSTOP) powder Apply 1 application topically 3 (three) times daily as needed. For jock itch and to prevent it 60 g 2   rosuvastatin (CRESTOR) 5 MG tablet Take 1 tablet (5 mg total) by mouth daily. 90 tablet 3   traZODone (DESYREL) 50 MG tablet Take 1 tablet by mouth daily.     venlafaxine (EFFEXOR) 37.5 MG tablet Take 1 tablet by mouth 2 (two) times daily with a meal.     VITAMIN D PO Take 5,000 Units by mouth daily.     No current facility-administered medications on file prior to visit.   Review of Systems  Constitutional:  Positive for fever and malaise/fatigue. Negative for chills.  HENT:  Negative for congestion, ear pain, sinus pain and sore throat.        Rhinorrhea  Eyes:  Negative for blurred vision, discharge and redness.  Respiratory:  Positive for cough and sputum production. Negative for shortness of breath, wheezing and stridor.   Cardiovascular:  Negative for chest pain, palpitations and leg swelling.  Gastrointestinal:  Negative for abdominal pain, diarrhea, nausea and vomiting.  Musculoskeletal:  Negative for myalgias.  Skin:  Negative for rash.  Neurological:  Negative for dizziness and headaches.     Observations/Objective: Patient appears well, in no distress Weight is baseline  No facial swelling or asymmetry Normal voice-not hoarse and no slurred speech No obvious tremor or mobility impairment Moving neck and UEs normally Able to hear  the call well  A few coughs heard (sound junky).  No sob or wheezing with speech  Baseline dementia noted, most of history given by his wife No skin changes on face or neck , no rash or pallor Affect is normal    Assessment and Plan: Problem List Items Addressed This Visit       Other   COVID-19    In 75 yo immunocompromised pt (RA) and dementia Some cough/weakness  Appetite is ok (eating and drinking)  Nl vitals and pulse ox molnupiravir px (in 5 d window and would have drug interactions with paxlovid)  ER precautions discussed mucinex DM prn cough (watch for any side  eff) Lots of fluids and rest  Supervise ambulation  inst to update if any new symptoms or worsening  Also if not improving in several days  Will continue to isolate a min of 5 d /until symptoms resolve      Relevant Medications   molnupiravir EUA (LAGEVRIO) 200 mg CAPS capsule     Follow Up Instructions: Take the molnupiravir as directed  Drink fluids and rest  mucinex DM is good for cough and congestion  Nasal saline for congestion as needed  Tylenol for fever or pain or headache  Please alert [place] if symptoms worsen (if severe or short of breath please go to the ER)   Continue to isolate for at least 5 days from first day of symptoms (longer if symptoms persist)    I discussed the assessment and treatment plan with the patient. The patient was provided an opportunity to ask questions and all were answered. The patient agreed with the plan and demonstrated an understanding of the instructions.   The patient was advised to call back or seek an in-person evaluation if the symptoms worsen or if the condition fails to improve as anticipated.     Roxy Manns, MD

## 2021-02-06 NOTE — Patient Instructions (Addendum)
Take the molnupiravir as directed  Alert [place] if any problems or side effects  Drink fluids and rest  mucinex DM is good for cough and congestion  Nasal saline for congestion as needed  Tylenol for fever or pain or headache  Please alert [place] if symptoms worsen (if severe or short of breath please go to the ER)   Continue to isolate for at least 5 days from first day of symptoms (longer if symptoms persist)

## 2021-02-06 NOTE — Assessment & Plan Note (Signed)
In 75 yo immunocompromised pt (RA) and dementia Some cough/weakness  Appetite is ok (eating and drinking)  Nl vitals and pulse ox molnupiravir px (in 5 d window and would have drug interactions with paxlovid)  ER precautions discussed mucinex DM prn cough (watch for any side eff) Lots of fluids and rest  Supervise ambulation  inst to update if any new symptoms or worsening  Also if not improving in several days  Will continue to isolate a min of 5 d /until symptoms resolve

## 2021-02-10 DIAGNOSIS — Z20822 Contact with and (suspected) exposure to covid-19: Secondary | ICD-10-CM | POA: Diagnosis not present

## 2021-02-17 DIAGNOSIS — Z20822 Contact with and (suspected) exposure to covid-19: Secondary | ICD-10-CM | POA: Diagnosis not present

## 2021-02-19 ENCOUNTER — Telehealth: Payer: Self-pay | Admitting: Family Medicine

## 2021-02-19 NOTE — Telephone Encounter (Signed)
Pt wife called wanting to know if it was normal for him to have a rash after taking the LAGEVRIO 200 mg CAPS capsule. The rash comes and goes, she wanted to discuss before making an appt

## 2021-02-19 NOTE — Telephone Encounter (Signed)
That could be a sign of hypersensitivity reaction (though that would be more likely to happen when he is actively taking it)  Where is the rash/ is it itchy? What does it look like?

## 2021-02-20 ENCOUNTER — Other Ambulatory Visit: Payer: Self-pay | Admitting: Family Medicine

## 2021-02-20 MED ORDER — CICLOPIROX OLAMINE 0.77 % EX CREA: TOPICAL_CREAM | Freq: Two times a day (BID) | CUTANEOUS | 3 refills | Status: DC

## 2021-02-20 NOTE — Telephone Encounter (Signed)
I'm suspicious that this is yeast (like the jock itch)  Stop the lotrisone for now and try ciclopirox (different cream for skin yeast) I sent it to their pharmacy   Use it on all the rash areas   Update me 1-2 wk with how it is (earlier if worse)

## 2021-02-20 NOTE — Telephone Encounter (Signed)
Spoke to wife and advise her of Dr. Royden Purl comments. Wife said pt has a rash in 3 main areas:   1st. Underneath pt's right arm: pt has a torn rotator cuff and it's isn't a candidate for surgery so he can't lift that arm or move it to much. Wife said there isn't any bums on his left underarm just his right. Wife said he rash is red with a few bumps on it but not many and when she asked pt if his underarm itched he said no it doesn't itch at all.  2nd. Rash still in his groin area. Pt said she has been using the lotrisone cream that PCP prescribed and it seems to be helping some but she's been using it since it was prescribed in Aug and pt still has rash. Pt said his groin area is the main spot that itches. Wife said it's red but not really bumpy just red and itchy.  3rd. Rash on bottom. Pt said his bottom itches some but not much. Wife said he has bumps on his bottom and they are itchy but not to bad.   I did ask wife has she been using the nystatin powder since she only mentioned the lotrisone cream. Wife said no she wasn't sure if that would help since she is keeping the cream on his groin area, she wasn't sure if she could use both, also if she should put either meds on other areas of skin? Or just use it on the groin aread? Please advise

## 2021-02-20 NOTE — Telephone Encounter (Signed)
Wife notified of Dr. Royden Purl instructions and that Rx was sent to pharmacy. Wife verbalized understanding

## 2021-02-20 NOTE — Addendum Note (Signed)
Addended by: Roxy Manns A on: 02/20/2021 01:15 PM   Modules accepted: Orders

## 2021-02-23 DIAGNOSIS — M1990 Unspecified osteoarthritis, unspecified site: Secondary | ICD-10-CM | POA: Diagnosis not present

## 2021-02-23 DIAGNOSIS — M25511 Pain in right shoulder: Secondary | ICD-10-CM | POA: Diagnosis not present

## 2021-02-23 DIAGNOSIS — M79646 Pain in unspecified finger(s): Secondary | ICD-10-CM | POA: Diagnosis not present

## 2021-02-23 DIAGNOSIS — Z79899 Other long term (current) drug therapy: Secondary | ICD-10-CM | POA: Diagnosis not present

## 2021-02-23 DIAGNOSIS — G3184 Mild cognitive impairment, so stated: Secondary | ICD-10-CM | POA: Diagnosis not present

## 2021-02-23 DIAGNOSIS — M0609 Rheumatoid arthritis without rheumatoid factor, multiple sites: Secondary | ICD-10-CM | POA: Diagnosis not present

## 2021-02-23 DIAGNOSIS — Z13820 Encounter for screening for osteoporosis: Secondary | ICD-10-CM | POA: Diagnosis not present

## 2021-02-23 DIAGNOSIS — R768 Other specified abnormal immunological findings in serum: Secondary | ICD-10-CM | POA: Diagnosis not present

## 2021-04-25 DIAGNOSIS — R296 Repeated falls: Secondary | ICD-10-CM | POA: Diagnosis not present

## 2021-04-25 DIAGNOSIS — R4586 Emotional lability: Secondary | ICD-10-CM | POA: Diagnosis not present

## 2021-04-25 DIAGNOSIS — R519 Headache, unspecified: Secondary | ICD-10-CM | POA: Diagnosis not present

## 2021-04-25 DIAGNOSIS — R413 Other amnesia: Secondary | ICD-10-CM | POA: Diagnosis not present

## 2021-04-25 DIAGNOSIS — F413 Other mixed anxiety disorders: Secondary | ICD-10-CM | POA: Diagnosis not present

## 2021-04-25 DIAGNOSIS — H53149 Visual discomfort, unspecified: Secondary | ICD-10-CM | POA: Diagnosis not present

## 2021-05-16 ENCOUNTER — Telehealth: Payer: Self-pay | Admitting: Family Medicine

## 2021-05-16 NOTE — Telephone Encounter (Signed)
Done and in IN box 

## 2021-05-16 NOTE — Telephone Encounter (Signed)
Letter mailed to pt per wife request

## 2021-05-16 NOTE — Telephone Encounter (Signed)
Mrs. Fieldhouse called in and wanted to know if Dr. Milinda Antis can write a letter stating that he is not able to do jury duty due to he has dementia

## 2021-05-25 DIAGNOSIS — G3184 Mild cognitive impairment, so stated: Secondary | ICD-10-CM | POA: Diagnosis not present

## 2021-05-25 DIAGNOSIS — M79646 Pain in unspecified finger(s): Secondary | ICD-10-CM | POA: Diagnosis not present

## 2021-05-25 DIAGNOSIS — Z79899 Other long term (current) drug therapy: Secondary | ICD-10-CM | POA: Diagnosis not present

## 2021-05-25 DIAGNOSIS — M25511 Pain in right shoulder: Secondary | ICD-10-CM | POA: Diagnosis not present

## 2021-05-25 DIAGNOSIS — M1990 Unspecified osteoarthritis, unspecified site: Secondary | ICD-10-CM | POA: Diagnosis not present

## 2021-05-25 DIAGNOSIS — Z13820 Encounter for screening for osteoporosis: Secondary | ICD-10-CM | POA: Diagnosis not present

## 2021-05-25 DIAGNOSIS — R768 Other specified abnormal immunological findings in serum: Secondary | ICD-10-CM | POA: Diagnosis not present

## 2021-05-25 DIAGNOSIS — M0609 Rheumatoid arthritis without rheumatoid factor, multiple sites: Secondary | ICD-10-CM | POA: Diagnosis not present

## 2021-05-25 DIAGNOSIS — M79642 Pain in left hand: Secondary | ICD-10-CM | POA: Diagnosis not present

## 2021-05-25 DIAGNOSIS — M79641 Pain in right hand: Secondary | ICD-10-CM | POA: Diagnosis not present

## 2021-06-21 ENCOUNTER — Telehealth: Payer: Self-pay

## 2021-06-21 NOTE — Telephone Encounter (Signed)
I spoke with pts wife and she said pt started mucinex. Pt had neg covid test on 06/20/21 and pt is feeling much better today. Pts wife said pt does not need appt. Pts wife will cb if needed.Sending note to DR Milinda Antis and [place] CMA.

## 2021-06-21 NOTE — Telephone Encounter (Signed)
[name] ILL Gender: Male DOB: 25-Jan-1946 Age: 76 Y 8 M 11 D Return Phone Number: [phone] (Primary) Address: [place]  [address] Client [facility] Night - Client Client Site [facility] - Night Provider Milinda Antis, Idamae Schuller - MD Contact Type Call Who Is Calling Patient / Member / Family / Caregiver Call Type Triage / Clinical Caller Name Debra Relationship To Patient Spouse Return Phone Number [phone] (Primary) Chief Complaint Cough Reason for Call Symptomatic / Request for Health Information Initial Comment Caller states husband is sick and wants to know if he can have mucinex He has a cough and runny nose. Translation No Nurse Assessment Nurse: Nunzio Cory, RN, Sherrie Date/Time (Eastern Time): 06/20/2021 12:11:45 PM Confirm and document reason for call. If symptomatic, describe symptoms. ---Caller states husband is having a cough and runny nose. Cough is wet/congested. Tested negative for COVID. Runny nose with clear drainage. No fever. Symptoms started on Monday. +fluids, +UOP in last 8 hrs. Does the patient have any new or worsening symptoms? ---Yes Will a triage be completed? ---Yes Related visit to physician within the last 2 weeks? ---No Does the PT have any chronic conditions? (i.e. diabetes, asthma, this includes High risk factors for pregnancy, etc.) ---Yes List chronic conditions. ---RA, Dementia. Is this a behavioral health or substance abuse call? ---No Guidelines Guideline Title Affirmed Question Affirmed Notes Nurse Date/Time (Eastern Time) Common Cold Common cold with no complications Nunzio Cory, RN, Sherrie 06/20/2021 12:15:32 PM Disp. Time Lamount Cohen Time) Disposition Final User 06/20/2021 12:21:49 PM Home Care Yes Nunzio Cory, RN, Sherrie PLEASE NOTE: All timestamps contained within this  report are represented as [place] Standard Time. CONFIDENTIALTY NOTICE: This fax transmission is intended only for the addressee. It contains information that is legally privileged, confidential or otherwise protected from use or disclosure. If you are not the intended recipient, you are strictly prohibited from reviewing, disclosing, copying using or disseminating any of this information or taking any action in reliance on or regarding this information. If you have received this fax in error, please notify us immediately by telephone so that we can arrange for its return to [place]. Phone: [phone], Toll-Free: [phone], Fax: [phone] Page: 2 of 2 Call Id: [id number] Caller Disagree/Comply Comply Caller Understands Yes PreDisposition Call a family member Care Advice Given Per Guideline HOME CARE: * You should be able to treat this at home. REASSURANCE AND EDUCATION - COMMON COLD SYMPTOMS: FOR A RUNNY NOSE - BLOW YOUR NOSE: * Nasal mucus and discharge help wash viruses and bacteria out of the nose and sinuses. * Introduction: Saline (salt water) nasal irrigation (nasal wash) is an effective and simple home remedy for treating stuffy nose and sinus congestion. The nose can be irrigated by pouring, spraying, or squirting salt water into the nose and then letting it run back out. * How it Helps: The salt water rinses out excess mucus and washes out any irritants (dust, allergens) that might be present. It also moistens the nasal cavity. * Cough: Use cough drops. * Hydrate: Drink adequate liquids. HUMIDIFIER: * Dry air makes coughs worse. CALL BACK IF: * You become short of breath * Runny nose lasts over 10 days * You become worse Referrals REFERRED TO PCP OFFIC

## 2021-06-29 ENCOUNTER — Other Ambulatory Visit: Payer: Self-pay | Admitting: Family Medicine

## 2021-06-30 ENCOUNTER — Other Ambulatory Visit: Payer: Self-pay | Admitting: Family Medicine

## 2021-07-02 NOTE — Telephone Encounter (Signed)
CPE was on 01/09/21, last filled on 01/09/21 # 30 g with 1 refill

## 2021-07-03 IMAGING — CT CT ABD-PELV W/O CM
1 of 2 series · 13 of 32 positions shown, 17 images · non-contrast
Comparison: Abdominal CT 01/20/2020

CLINICAL DATA: Lymphadenopathy, inguinal/pelvic

Patient reports abdominal discomfort with alternating constipation
and diarrhea.
EXAM:
CT ABDOMEN AND PELVIS WITHOUT CONTRAST
TECHNIQUE: Multidetector CT imaging of the abdomen and pelvis was performed
following the standard protocol without IV contrast.

[Series 2: abd/pelvis w/(date) · axial · 0.91mm/px · z∈[-536,-111]mm · 13 of 96 slices shown, 17 images]
[im 6/96  soft-tissue]
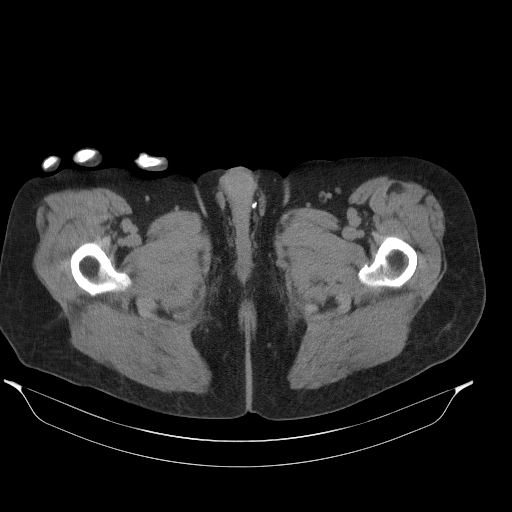
[im 6/96  bone]
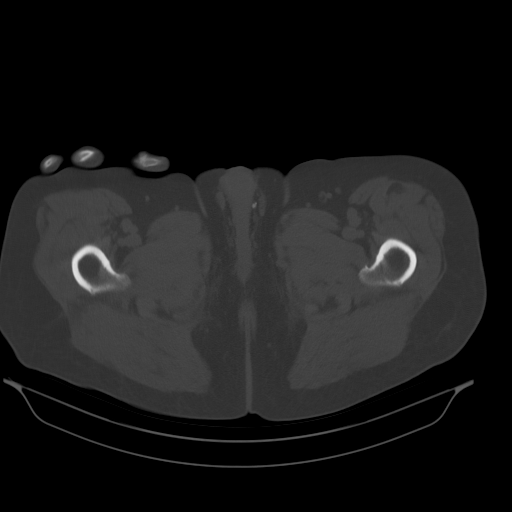
[im 16/96  soft-tissue]
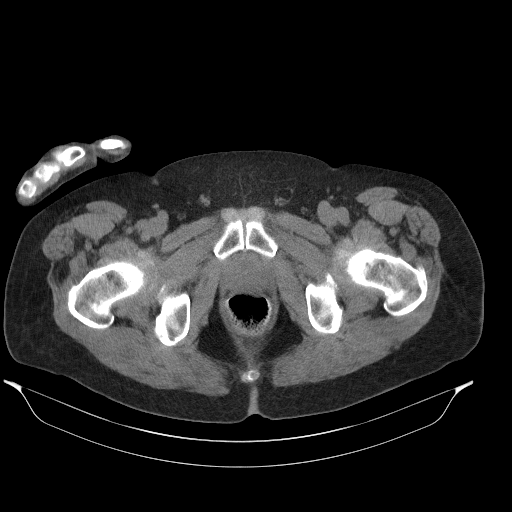
[im 26/96  soft-tissue]
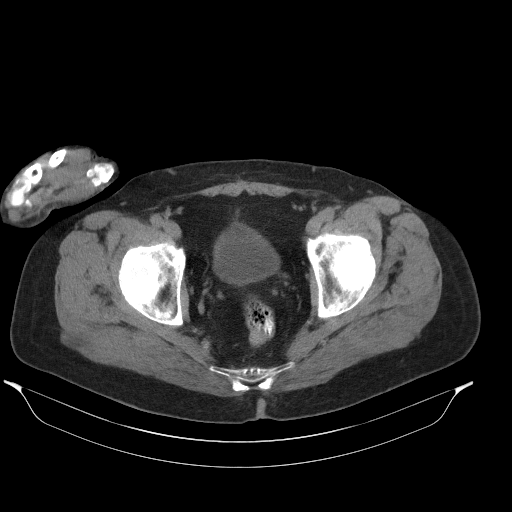
[im 31/96  soft-tissue]
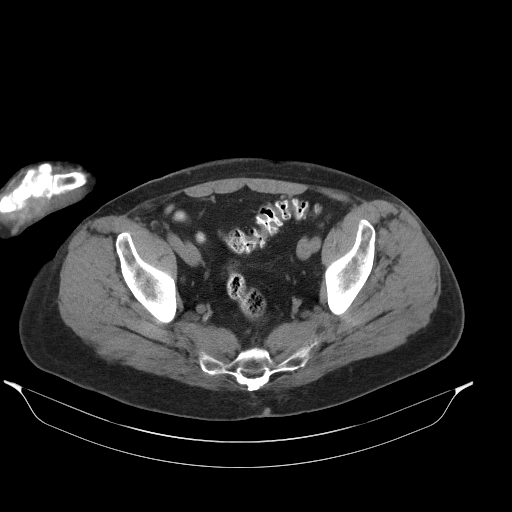
[im 41/96  soft-tissue]
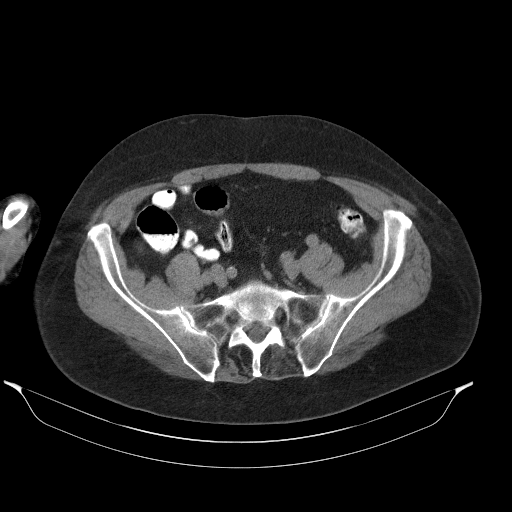
[im 51/96  soft-tissue]
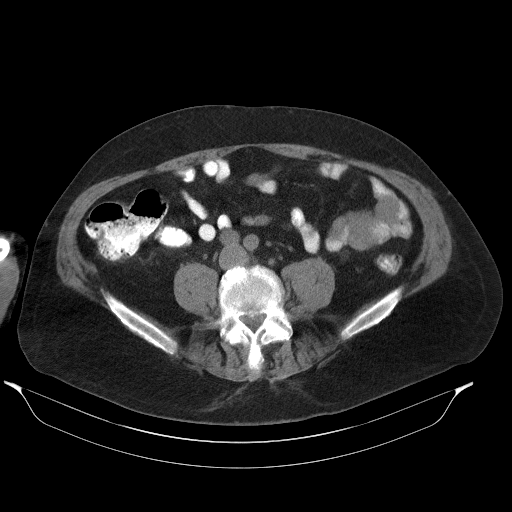
[im 56/96  soft-tissue]
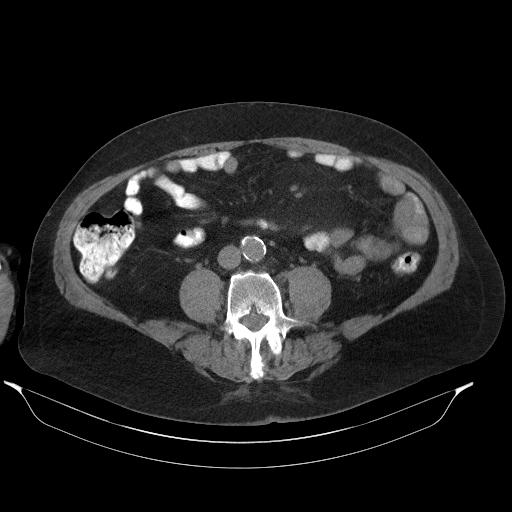
[im 66/96  soft-tissue]
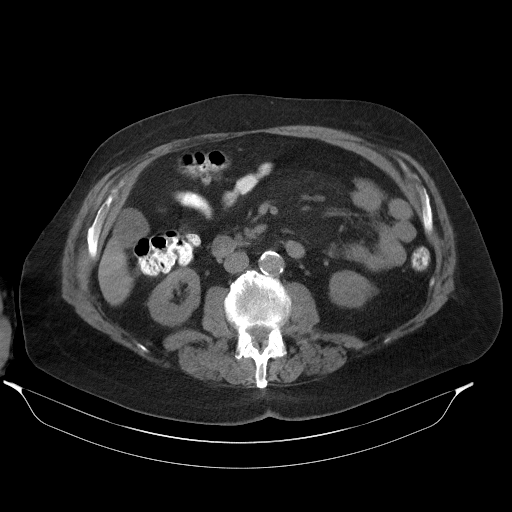
[im 71/96  soft-tissue]
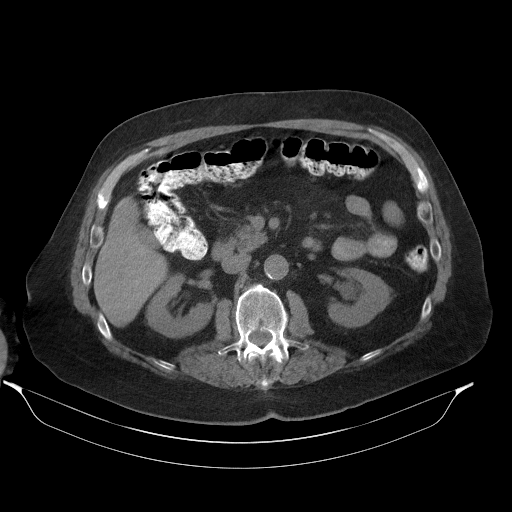
[im 71/96  bone]
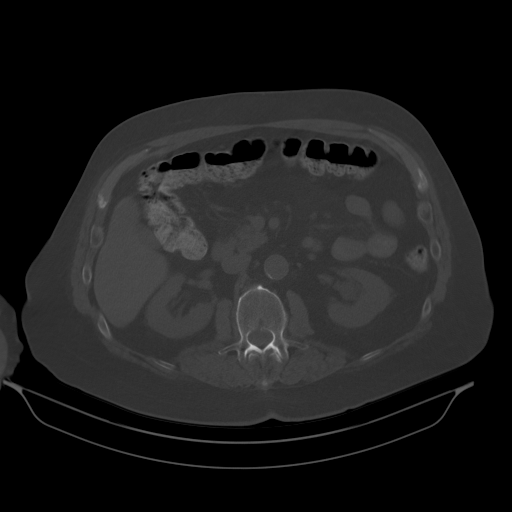
[im 76/96  lung]
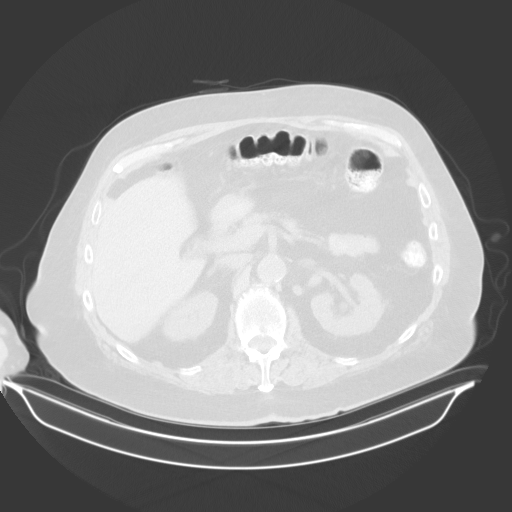
[im 81/96  soft-tissue]
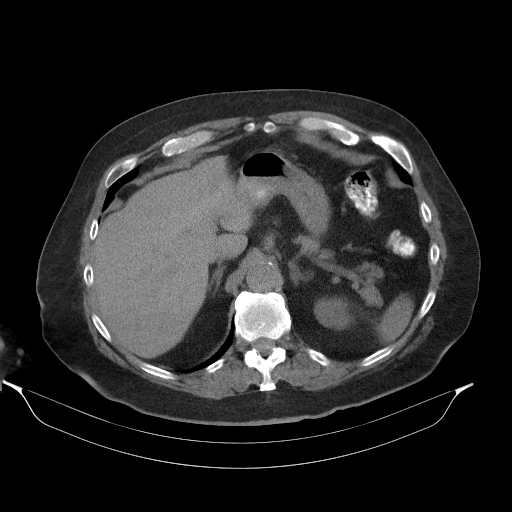
[im 81/96  lung]
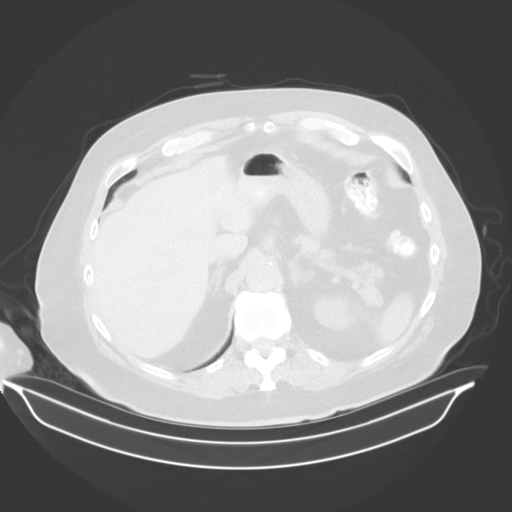
[im 86/96  lung]
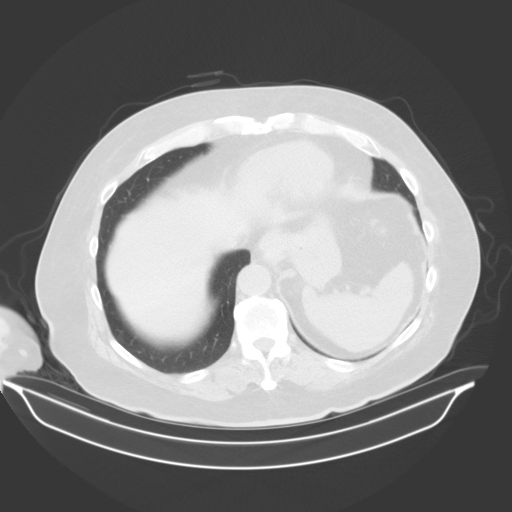
[im 91/96  soft-tissue]
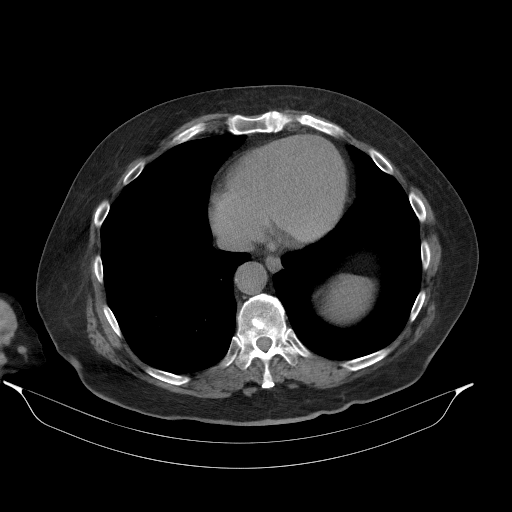
[im 91/96  lung]
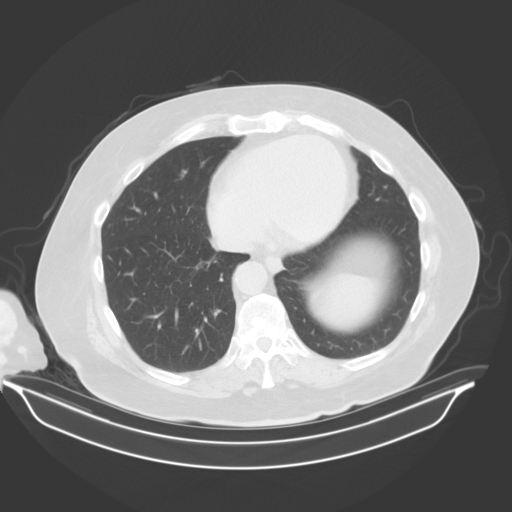

[13 of 32 positions shown; findings below may reference images not displayed]

FINDINGS: Lower chest: Normal heart size. No focal airspace opacity. No
pleural fluid. Descending thoracic aorta is tortuous.

Hepatobiliary: No evidence of focal hepatic lesion on noncontrast
exam. Gallbladder physiologically distended, no calcified stone. The
previous common bile duct dilatation is not well appreciated on this
noncontrast exam.

Pancreas: Mild parenchymal atrophy. The previous pancreatic ductal
prominence on prior exam is not appreciated. There is no
peripancreatic fat stranding.

Spleen: Normal in size without focal abnormality. Greatest splenic
length is 9.9 cm.

Adrenals/Urinary Tract: Normal right adrenal gland. Stable 18 mm
low-density nodule in the left adrenal gland typical of adenoma. No
hydronephrosis or perinephric edema. 3 mm nonobstructing stone in
the mid right kidney again seen. 2 mm nonobstructing stone in the
mid lower left kidney again seen. Water density lesion in the medial
left kidney measuring 2.8 cm consistent with cyst. This is unchanged
from prior exam allowing for differences in caliper placement. No
evidence of solid lesion on noncontrast exam. Decompressed ureters.
Urinary bladder is minimally distended.

Stomach/Bowel: Decompressed stomach, unremarkable. There is no small
bowel obstruction, administered enteric contrast reaches the colon.
No small bowel wall thickening or inflammation. Normal appendix.
Moderate stool in the ascending and transverse colon. Small volume
of stool in the descending and sigmoid colon. Occasional descending
and sigmoid colonic diverticula without diverticulitis. No colonic
wall thickening or evidence of colonic mass.

Vascular/Lymphatic: Haziness in the central small bowel mesentery
with scattered mesenteric lymph nodes, unchanged from prior exam.
Largest mesenteric node measures 7 mm.No enlarged lymph nodes in the
abdomen or pelvis. Particularly, there is no evidence of inguinal or
pelvic adenopathy. No retroperitoneal or upper abdominal adenopathy.
Aortic atherosclerosis and tortuosity. No aortic aneurysm.

Reproductive: Prostate gland unremarkable.

Other: No inguinal or body wall hernia. No ascites. No evidence of
intra-abdominal mass.

Musculoskeletal: Lumbar degenerative disc disease and facet
hypertrophy. There are no acute or suspicious osseous abnormalities.
Intramuscular calcification involving the left obturator muscles
suggest remote prior injury.
IMPRESSION: 1. Stable haziness in the central small bowel mesentery with
scattered mesenteric lymph nodes. This is unchanged in appearance
from prior exam. This may represent sequela of prior
panniculitis/mesenteric adenitis. There is no progressive mesenteric
adenopathy or small bowel abnormality.
2. No inguinal adenopathy.
3. Nonobstructing bilateral nephrolithiasis.
4. The previous prominent common bile duct and proximal pancreatic
duct is not well appreciated on this current noncontrast exam.
5. Minimal colonic diverticulosis without diverticulitis.
6. Stable left adrenal adenoma.

Aortic Atherosclerosis ([id number]).

## 2021-07-20 ENCOUNTER — Ambulatory Visit (INDEPENDENT_AMBULATORY_CARE_PROVIDER_SITE_OTHER): Payer: PPO | Admitting: Family Medicine

## 2021-07-20 ENCOUNTER — Other Ambulatory Visit: Payer: Self-pay

## 2021-07-20 ENCOUNTER — Encounter: Payer: Self-pay | Admitting: Family Medicine

## 2021-07-20 VITALS — BP 106/70 | HR 60 | Temp 98.1°F | Ht 73.5 in | Wt 245.0 lb

## 2021-07-20 DIAGNOSIS — L600 Ingrowing nail: Secondary | ICD-10-CM | POA: Diagnosis not present

## 2021-07-20 DIAGNOSIS — B356 Tinea cruris: Secondary | ICD-10-CM

## 2021-07-20 MED ORDER — CICLOPIROX OLAMINE 0.77 % EX CREA
TOPICAL_CREAM | Freq: Two times a day (BID) | CUTANEOUS | 3 refills | Status: DC
Start: 2021-07-20 — End: 2021-12-13

## 2021-07-20 NOTE — Assessment & Plan Note (Signed)
Mild medially ingrown nail on R foot  ?Nail appears nl  ?No signs of infection  ?Ref to podiatry to consider procedure ?inst to soak in warm soapy water and update if worse  ? ?

## 2021-07-20 NOTE — Patient Instructions (Addendum)
Stop the nystatin powder  ?When the rash is better use cornstarch to keep area dry  ? ? ?Get some boxer briefs  ?Try the generic loprox cream twice daily to affected areas (groin and underarm)  ?Keep areas as clean and dry as possible  ? ?If worse or not improved please let me know  ? ?For the ingrown toe nail I will refer to a podiatrist  ?You will get a call  ?You can soak your toe in warm soapy water and then dry well  ?Keep nails cut straight across and not too short ?If red or swollen or more painful let us know  ? ? ? ?

## 2021-07-20 NOTE — Progress Notes (Signed)
? ?Subjective:  ? ? Patient ID: Troy [name], male    DOB: 14-Jul-1945, 76 y.o.   MRN: [id number] ? ?This visit occurred during the SARS-CoV-2 public health emergency.  Safety protocols were in place, including screening questions prior to the visit, additional usage of staff PPE, and extensive cleaning of exam room while observing appropriate contact time as indicated for disinfecting solutions.  ? ?HPI ?Pt presents with increase in jock itch ?Also R great toe pain  ? ?Wt Readings from Last 3 Encounters:  ?07/20/21 245 lb (111.1 kg)  ?01/09/21 228 lb (103.4 kg)  ?10/12/20 234 lb (106.1 kg)  ? ?31.89 kg/m? ? ? ?Groin rash ?Nystatin powder helps briefly  ?Lotrisone helps a bit more  (just a little better) ?Very red  ? ?A spot under arm also  ? ?In the past during a flare we have used oral fluconazole  ? ?Tried cicloipirox in the fall -thinks  ? ?No athlete's foot that they know of  ? ? ?He has RA and takes methotrexate and baricitinib ? ? ?R great toe bother him  ?? If ingrown toe nail  ?Does not cut nails too short  ? ?Patient Active Problem List  ? Diagnosis Date Noted  ? Ingrown nail of great toe of right foot 07/20/2021  ? COVID-19 02/06/2021  ? Medicare annual wellness visit, subsequent 01/09/2021  ? Tinea cruris 06/20/2020  ? Mesenteric lymphadenopathy 06/15/2020  ? Dementia (HCC) 02/11/2020  ? Constipation 11/23/2019  ? BPH (benign prostatic hyperplasia) 11/23/2019  ? Microscopic hematuria 11/23/2019  ? Abnormal CXR 11/04/2019  ? Vitamin D deficiency 09/24/2019  ? Rheumatoid arthritis (HCC) 09/14/2019  ? Mild dehydration 09/14/2019  ? Positive ANA (antinuclear antibody) 03/21/2019  ? Anxiety 06/09/2015  ? Routine general medical examination at a health care facility 12/10/2013  ? Family history of colon cancer 12/10/2013  ? Prostate cancer screening 12/05/2013  ? Allergic rhinitis 10/18/2013  ? ROTATOR CUFF INJURY, LEFT SHOULDER 04/30/2010  ? Prediabetes 08/21/2009  ? HYPERLIPIDEMIA 04/20/2007  ?  OSTEOARTHRITIS 02/09/2007  ? ?History reviewed. No pertinent past medical history. ?Past Surgical History:  ?Procedure Laterality Date  ? BACK SURGERY  04/1998  ? LIPOMA EXCISION  09/2009  ? ROTATOR CUFF REPAIR Left 2012  ? TONSILLECTOMY AND ADENOIDECTOMY  age 52  ? ?Social History  ? ?Tobacco Use  ? Smoking status: Never  ?  Passive exposure: Past  ? Smokeless tobacco: Never  ?Substance Use Topics  ? Alcohol use: No  ?  Comment: quit over 39 years ago  ? Drug use: No  ? ?Family History  ?Problem Relation Age of Onset  ? Dementia Mother   ? Colon cancer Father 41  ? ?Allergies  ?Allergen Reactions  ? Chlorzoxazone   ?  REACTION: dizzy  ? Elemental Sulfur Rash  ? ?Current Outpatient Medications on File Prior to Visit  ?Medication Sig Dispense Refill  ? Acetaminophen (TYLENOL ARTHRITIS PAIN PO) Take by mouth.    ? baricitinib (OLUMIANT) 2 MG TABS tablet Take 2 mg by mouth daily.    ? clonazePAM (KLONOPIN) 0.25 MG disintegrating tablet 1 tablet on the tongue and allow to dissolve 30 minutes before bedtime    ? clotrimazole-betamethasone (LOTRISONE) cream APPLY 1 APPLICATION TOPICALLY 2 (TWO) TIMES DAILY AS NEEDED. FOR JOCK ITCH FLARE FOR 10 DAYS 30 g 1  ? Elderberry 575 MG/5ML SYRP Take by mouth.    ? folic acid (FOLVITE) 1 MG tablet Take 1 mg by mouth daily.    ?  memantine (NAMENDA) 10 MG tablet Take 1 tablet (10 mg total) by mouth 2 (two) times daily. 60 tablet 5  ? methotrexate (RHEUMATREX) 2.5 MG tablet Take 8 tablets by mouth once a week.    ? mirabegron ER (MYRBETRIQ) 25 MG TB24 tablet Take 25 mg by mouth daily.    ? nystatin (MYCOSTATIN/NYSTOP) powder Apply 1 application topically 3 (three) times daily as needed. For jock itch and to prevent it 60 g 2  ? rosuvastatin (CRESTOR) 5 MG tablet TAKE 1 TABLET (5 MG TOTAL) BY MOUTH DAILY. 90 tablet 1  ? traZODone (DESYREL) 50 MG tablet Take 1 tablet by mouth daily.    ? venlafaxine (EFFEXOR) 37.5 MG tablet Take 1 tablet by mouth 2 (two) times daily with a meal.    ?  VITAMIN D PO Take 5,000 Units by mouth daily.    ? ?No current facility-administered medications on file prior to visit.  ?  ? ?Review of Systems  ?Constitutional:  Negative for activity change, appetite change, fatigue, fever and unexpected weight change.  ?HENT:  Negative for congestion, rhinorrhea, sore throat and trouble swallowing.   ?Eyes:  Negative for pain, redness, itching and visual disturbance.  ?Respiratory:  Negative for cough, chest tightness, shortness of breath and wheezing.   ?Cardiovascular:  Negative for chest pain and palpitations.  ?Gastrointestinal:  Negative for abdominal pain, blood in stool, constipation, diarrhea and nausea.  ?Endocrine: Negative for cold intolerance, heat intolerance, polydipsia and polyuria.  ?Genitourinary:  Negative for difficulty urinating, dysuria, frequency and urgency.  ?Musculoskeletal:  Negative for arthralgias, joint swelling and myalgias.  ?Skin:  Positive for rash. Negative for pallor.  ?Neurological:  Negative for dizziness, tremors, weakness, numbness and headaches.  ?Hematological:  Negative for adenopathy. Does not bruise/bleed easily.  ?Psychiatric/Behavioral:  Negative for dysphoric mood. The patient is not nervous/anxious.   ? ?   ?Objective:  ? Physical Exam ?Constitutional:   ?   General: He is not in acute distress. ?   Appearance: Normal appearance. He is obese. He is not ill-appearing.  ?HENT:  ?   Head: Normocephalic and atraumatic.  ?Eyes:  ?   Conjunctiva/sclera: Conjunctivae normal.  ?   Pupils: Pupils are equal, round, and reactive to light.  ?Cardiovascular:  ?   Rate and Rhythm: Normal rate and regular rhythm.  ?Pulmonary:  ?   Effort: Pulmonary effort is normal. No respiratory distress.  ?   Breath sounds: No wheezing.  ?Musculoskeletal:  ?   Cervical back: No tenderness.  ?   Right lower leg: No edema.  ?   Left lower leg: No edema.  ?Lymphadenopathy:  ?   Cervical: No cervical adenopathy.  ?Skin: ?   Findings: Erythema and rash present.  ?    Comments: R great toe nail is medially ingrown ?Mild ?No erythema or swelling or drainage  ? ?Nail is not thickened  ? ?Skin in groin is erythematous with some satellite lesions, worse on L   ?Also small area in R axilla  ?Cream is present  ?No swelling or skin breakdown  ?Neurological:  ?   Mental Status: He is alert.  ?   Cranial Nerves: No cranial nerve deficit.  ?Psychiatric:     ?   Mood and Affect: Mood normal.  ? ? ? ? ? ?   ?Assessment & Plan:  ? ?Problem List Items Addressed This Visit   ? ?  ? Musculoskeletal and Integument  ? Ingrown nail of great toe of right  foot  ?  Mild medially ingrown nail on R foot  ?Nail appears nl  ?No signs of infection  ?Ref to podiatry to consider procedure ?inst to soak in warm soapy water and update if worse  ? ?  ?  ? Relevant Orders  ? Ambulatory referral to Podiatry  ? Tinea cruris - Primary  ?  Recurrent in pt on biologic tx for RA ?No skin breakdown ?Disc keeping areas clean and dry (scant under R arm also) ?Can try boxer briefs  ?Refilled ciclopirox to use bid  ?Stop nystatin  ?Hold lotrisone  ?No signs of tinea pedis on exam  ?Update if not starting to improve in a week or if worsening   ?If worse would consider diflucan ? ? ?  ?  ? Relevant Medications  ? ciclopirox (LOPROX) 0.77 % cream  ? ? ? ?

## 2021-07-20 NOTE — Assessment & Plan Note (Signed)
Recurrent in pt on biologic tx for RA ?No skin breakdown ?Disc keeping areas clean and dry (scant under R arm also) ?Can try boxer briefs  ?Refilled ciclopirox to use bid  ?Stop nystatin  ?Hold lotrisone  ?No signs of tinea pedis on exam  ?Update if not starting to improve in a week or if worsening   ?If worse would consider diflucan ? ? ?

## 2021-08-01 ENCOUNTER — Other Ambulatory Visit: Payer: Self-pay

## 2021-08-01 ENCOUNTER — Ambulatory Visit: Payer: PPO | Admitting: Podiatry

## 2021-08-01 ENCOUNTER — Encounter: Payer: Self-pay | Admitting: Podiatry

## 2021-08-01 DIAGNOSIS — L600 Ingrowing nail: Secondary | ICD-10-CM | POA: Diagnosis not present

## 2021-08-01 NOTE — Progress Notes (Signed)
?  Subjective:  ?Patient ID: Troy [name], male    DOB: [birth year],   MRN: [id number] ? ?No chief complaint on file. ? ? ?76 y.o. male presents for concern of right great ingrown nail toenail that has been present for several months. Relates he has had soreness and redness. Relates they have tried trimming back but still painful.  He is pre-diabetic.   Denies any other pedal complaints. Denies n/v/f/c.  ? ?History reviewed. No pertinent past medical history. ? ?Objective:  ?Physical Exam: ?Vascular: DP/PT pulses 2/4 bilateral. CFT <3 seconds. Normal hair growth on digits. No edema.  ?Skin. No lacerations or abrasions bilateral feet. Incurvation of medial border of right great toenail. Mild erythema. No edema or purulence noted.  ?Musculoskeletal: MMT 5/5 bilateral lower extremities in DF, PF, Inversion and Eversion. Deceased ROM in DF of ankle joint.  ?Neurological: Sensation intact to light touch.  ? ?Assessment:  ? ?1. Ingrown nail of great toe of right foot   ? ? ? ?Plan:  ?Patient was evaluated and treated and all questions answered. ?Patient requesting removal of ingrown nail today. Procedure below.  ?Discussed procedure and post procedure care and patient expressed understanding.  ?Will follow-up in 2 weeks for nail check or sooner if any problems arise.  ? ? ?Procedure:  ?Procedure: partial Nail Avulsion of right hallux lateral nail border.  ?Surgeon: Lorenda Peck, DPM  ?Pre-op Dx: Ingrown toenail without infection ?Post-op: Same  ?Place of Surgery: Office exam room.  ?Indications for surgery: Painful and ingrown toenail.  ? ? ?The patient is requesting removal of nail with chemical matrixectomy. Risks and complications were discussed with the patient for which they understand and written consent was obtained. Under sterile conditions a total of 3 mL of  1% lidocaine plain was infiltrated in a hallux block fashion. Once anesthetized, the skin was prepped in sterile fashion. A tourniquet was then applied.  Next the lateral aspect of hallux nail border was then sharply excised making sure to remove the entire offending nail border.  Next phenol was then applied under standard conditions and copiously irrigated. Silvadene was applied. A dry sterile dressing was applied. After application of the dressing the tourniquet was removed and there is found to be an immediate capillary refill time to the digit. The patient tolerated the procedure well without any complications. Post procedure instructions were discussed the patient for which he verbally understood. Follow-up in two weeks for nail check or sooner if any problems are to arise. Discussed signs/symptoms of infection and directed to call the office immediately should any occur or go directly to the emergency room. In the meantime, encouraged to call the office with any questions, concerns, changes symptoms. ? ? ?Lorenda Peck, DPM  ? ? ?

## 2021-08-01 NOTE — Patient Instructions (Signed)

## 2021-08-15 ENCOUNTER — Ambulatory Visit: Payer: PPO | Admitting: Podiatry

## 2021-08-15 ENCOUNTER — Encounter: Payer: Self-pay | Admitting: Podiatry

## 2021-08-15 DIAGNOSIS — L600 Ingrowing nail: Secondary | ICD-10-CM

## 2021-08-15 NOTE — Progress Notes (Signed)
?  Subjective:  ?Patient ID: KARAR WILKER, male    DOB: Feb 17, 1946,   MRN: [id number] ? ?Chief Complaint  ?Patient presents with  ? Nail Problem  ?   ?Right great toe nail check   ? ? ?76 y.o. male presents for follow-up of right great ingrown nail removal. Relates doing well without pain. Has soaked and done neosporin and bandaid.  . Denies any other pedal complaints. Denies n/v/f/c.  ? ?No past medical history on file. ? ?Objective:  ?Physical Exam: ?Vascular: DP/PT pulses 2/4 bilateral. CFT <3 seconds. Normal hair growth on digits. No edema.  ?Skin. No lacerations or abrasions bilateral feet. Right hallux nail healing well.  ?Musculoskeletal: MMT 5/5 bilateral lower extremities in DF, PF, Inversion and Eversion. Deceased ROM in DF of ankle joint.  ?Neurological: Sensation intact to light touch.  ? ?Assessment:  ?No diagnosis found. ? ? ?Plan:  ?Patient was evaluated and treated and all questions answered. ?Toe was evaluated and appears to be healing well.  ?May discontinue soaks and neosporin.  ?Patient to follow-up as needed.  ? ? ?Louann Sjogren, DPM  ? ? ?

## 2021-08-31 DIAGNOSIS — M0609 Rheumatoid arthritis without rheumatoid factor, multiple sites: Secondary | ICD-10-CM | POA: Diagnosis not present

## 2021-08-31 DIAGNOSIS — M1990 Unspecified osteoarthritis, unspecified site: Secondary | ICD-10-CM | POA: Diagnosis not present

## 2021-08-31 DIAGNOSIS — M25511 Pain in right shoulder: Secondary | ICD-10-CM | POA: Diagnosis not present

## 2021-08-31 DIAGNOSIS — R768 Other specified abnormal immunological findings in serum: Secondary | ICD-10-CM | POA: Diagnosis not present

## 2021-08-31 DIAGNOSIS — L298 Other pruritus: Secondary | ICD-10-CM | POA: Diagnosis not present

## 2021-08-31 DIAGNOSIS — Z13820 Encounter for screening for osteoporosis: Secondary | ICD-10-CM | POA: Diagnosis not present

## 2021-08-31 DIAGNOSIS — M79646 Pain in unspecified finger(s): Secondary | ICD-10-CM | POA: Diagnosis not present

## 2021-08-31 DIAGNOSIS — Z79899 Other long term (current) drug therapy: Secondary | ICD-10-CM | POA: Diagnosis not present

## 2021-08-31 DIAGNOSIS — M25561 Pain in right knee: Secondary | ICD-10-CM | POA: Diagnosis not present

## 2021-08-31 DIAGNOSIS — G3184 Mild cognitive impairment, so stated: Secondary | ICD-10-CM | POA: Diagnosis not present

## 2021-09-26 ENCOUNTER — Ambulatory Visit (INDEPENDENT_AMBULATORY_CARE_PROVIDER_SITE_OTHER): Payer: PPO | Admitting: Family Medicine

## 2021-09-26 ENCOUNTER — Encounter: Payer: Self-pay | Admitting: Family Medicine

## 2021-09-26 VITALS — BP 112/60 | HR 57 | Temp 98.1°F | Ht 73.5 in | Wt 245.4 lb

## 2021-09-26 DIAGNOSIS — B029 Zoster without complications: Secondary | ICD-10-CM | POA: Diagnosis not present

## 2021-09-26 NOTE — Progress Notes (Signed)
? ? ?Dianelly Ferran T. Aedyn Kempfer, MD, CAQ Sports Medicine ?Nature conservation officer at [facility] ?[address] [place] ?[place], [address] ? ?Phone: [phone]  FAX: [phone] ? ?Troy [name] - 76 y.o. male  MRN [id number]  Date of Birth: 12-08-1945 ? ?Date: 09/26/2021  PCP: Judy Pimple, MD  Referral: Judy Pimple, MD ? ?Chief Complaint  ?Patient presents with  ? Rash  ?  On stomach/back  ? ?Subjective:  ? ?Troy [name] is a 76 y.o. very pleasant male patient with Body mass index is 31.93 kg/m?. who presents with the following: ? ?Presents with an acute rash: ? ?He has had vesicular rash on the right side of his thorax roughly 2 days.  Did start to feel some discomfort and felt like a bruise last week on Thursday.  Also he did move some furniture around that time as well. ? ?He is on both methotrexate and Olumiant for rheumatoid arthritis. ? ?No prior history of zoster. ? ?Side - felt like a bruise yesterday. ? ?Immunization History  ?Administered Date(s) Administered  ? Fluad Quad(high Dose 65+) 02/11/2020, 01/09/2021  ? Influenza Whole 02/16/2007, 02/21/2010  ? Influenza, High Dose Seasonal PF 02/01/2019  ? Influenza,inj,Quad PF,6+ Mos 02/19/2017  ? Influenza-Unspecified 02/10/2018  ? PFIZER(Purple Top)SARS-COV-2 Vaccination 06/17/2019, 07/09/2019, 04/11/2020  ? Pneumococcal Conjugate-13 01/01/2017  ? Pneumococcal-Unspecified 01/12/2011  ? Td 11/22/2002, 05/08/2020  ? Tdap 01/11/2006, 01/26/2015  ?  ? ?Review of Systems is noted in the HPI, as appropriate ? ?Objective:  ? ?BP 112/60   Pulse (!) 57   Temp 98.1 ?F (36.7 ?C) (Oral)   Ht 6' 1.5" (1.867 m)   Wt 245 lb 6 oz (111.3 kg)   SpO2 97%   BMI 31.93 kg/m?  ? ?GEN: No acute distress; alert,appropriate. ?PULM: Breathing comfortably in no respiratory distress ?PSYCH: Normally interactive.  ? ? ? ? ?Laboratory and Imaging Data: ? ?Assessment and Plan:  ? ?  ICD-10-CM   ?1. Herpes zoster without complication  B02.9   ?  ? ?Total encounter time: 30  minutes. This includes total time spent on the day of encounter.  Additional time spent on lab review and interaction review with Valtrex and methotrexate and injectable DMARDs. ? ?After consideration, discussion with patient and wife given significant dementia, I think that ultimately potential risks outweigh potential benefits.  He has had symptoms possibly since the end of last week.  Rash is only been 2 days, but there is some risk of hepatotoxicity and renal toxicity with Valtrex.  He would still have methotrexate and is system, given that he takes his methotrexate dose on Saturdays. ? ?Treat with supportive care. ? ?Medication Management during today's office visit: ?No orders of the defined types were placed in this encounter. ? ?There are no discontinued medications. ? ?Orders placed today for conditions managed today: ?No orders of the defined types were placed in this encounter. ? ? ?Follow-up if needed: No follow-ups on file. ? ?Dragon [employer] One speech-to-text software was used for transcription in this dictation.  Possible transcriptional errors can occur using Animal nutritionist.  ? ?Signed, ? ?Areyanna Figeroa T. Iris Hairston, MD ? ? ?Outpatient Encounter Medications as of 09/26/2021  ?Medication Sig  ? Acetaminophen (TYLENOL ARTHRITIS PAIN PO) Take by mouth.  ? baricitinib (OLUMIANT) 2 MG TABS tablet Take 2 mg by mouth daily.  ? ciclopirox (LOPROX) 0.77 % cream Apply topically 2 (two) times daily. To affected areas  ? clonazePAM (KLONOPIN) 0.25 MG disintegrating tablet 1 tablet  on the tongue and allow to dissolve 30 minutes before bedtime  ? clotrimazole-betamethasone (LOTRISONE) cream APPLY 1 APPLICATION TOPICALLY 2 (TWO) TIMES DAILY AS NEEDED. FOR JOCK ITCH FLARE FOR 10 DAYS  ? donepezil (ARICEPT) 5 MG tablet TAKE ONE TABLET BY MOUTH DAILY FOR MEMORY  ? Elderberry 575 MG/5ML SYRP Take by mouth.  ? folic acid (FOLVITE) 1 MG tablet Take 1 mg by mouth daily.  ? memantine (NAMENDA) 10 MG tablet Take 1 tablet (10 mg  total) by mouth 2 (two) times daily.  ? methotrexate (RHEUMATREX) 2.5 MG tablet Take 8 tablets by mouth once a week.  ? mirabegron ER (MYRBETRIQ) 25 MG TB24 tablet Take 25 mg by mouth daily.  ? nystatin (MYCOSTATIN/NYSTOP) powder Apply 1 application topically 3 (three) times daily as needed. For jock itch and to prevent it  ? rosuvastatin (CRESTOR) 5 MG tablet TAKE 1 TABLET (5 MG TOTAL) BY MOUTH DAILY.  ? traZODone (DESYREL) 50 MG tablet Take 1 tablet by mouth daily.  ? venlafaxine (EFFEXOR) 37.5 MG tablet Take 1 tablet by mouth 2 (two) times daily with a meal.  ? VITAMIN D PO Take 5,000 Units by mouth daily.  ? ?No facility-administered encounter medications on file as of 09/26/2021.  ?  ?

## 2021-09-28 ENCOUNTER — Telehealth: Payer: Self-pay | Admitting: Family Medicine

## 2021-09-28 NOTE — Telephone Encounter (Signed)
Troy [name] (Spouse) called and wanted to ask the nurse a question about his last apt with Dr. Lorelei Pont on 09/26/2021 for possible shingles. She wants to know if he could receive a medication for the itchiness, he is taking tylenol already. Please return the call when possible.   Callback Number:

## 2021-10-01 NOTE — Telephone Encounter (Signed)
Please call  There is not an easy answer for that.  He should be able to take some OTC counter benadryl, and hopefully it will help.  I think keeping the area dry, then applying some lotion would possibly help.

## 2021-10-01 NOTE — Telephone Encounter (Signed)
Debra notified as instructed by telephone.  States understanding. 

## 2021-10-23 DIAGNOSIS — H53149 Visual discomfort, unspecified: Secondary | ICD-10-CM | POA: Diagnosis not present

## 2021-10-23 DIAGNOSIS — G3183 Dementia with Lewy bodies: Secondary | ICD-10-CM | POA: Diagnosis not present

## 2021-10-23 DIAGNOSIS — F413 Other mixed anxiety disorders: Secondary | ICD-10-CM | POA: Diagnosis not present

## 2021-10-23 DIAGNOSIS — F02818 Dementia in other diseases classified elsewhere, unspecified severity, with other behavioral disturbance: Secondary | ICD-10-CM | POA: Diagnosis not present

## 2021-10-23 DIAGNOSIS — R4586 Emotional lability: Secondary | ICD-10-CM | POA: Diagnosis not present

## 2021-10-23 DIAGNOSIS — R519 Headache, unspecified: Secondary | ICD-10-CM | POA: Diagnosis not present

## 2021-10-23 DIAGNOSIS — R413 Other amnesia: Secondary | ICD-10-CM | POA: Diagnosis not present

## 2021-10-23 DIAGNOSIS — R296 Repeated falls: Secondary | ICD-10-CM | POA: Diagnosis not present

## 2021-12-04 DIAGNOSIS — Z79899 Other long term (current) drug therapy: Secondary | ICD-10-CM | POA: Diagnosis not present

## 2021-12-04 DIAGNOSIS — M25561 Pain in right knee: Secondary | ICD-10-CM | POA: Diagnosis not present

## 2021-12-04 DIAGNOSIS — M1990 Unspecified osteoarthritis, unspecified site: Secondary | ICD-10-CM | POA: Diagnosis not present

## 2021-12-04 DIAGNOSIS — M79646 Pain in unspecified finger(s): Secondary | ICD-10-CM | POA: Diagnosis not present

## 2021-12-04 DIAGNOSIS — M0609 Rheumatoid arthritis without rheumatoid factor, multiple sites: Secondary | ICD-10-CM | POA: Diagnosis not present

## 2021-12-04 DIAGNOSIS — R768 Other specified abnormal immunological findings in serum: Secondary | ICD-10-CM | POA: Diagnosis not present

## 2021-12-04 DIAGNOSIS — Z13820 Encounter for screening for osteoporosis: Secondary | ICD-10-CM | POA: Diagnosis not present

## 2021-12-04 DIAGNOSIS — M25511 Pain in right shoulder: Secondary | ICD-10-CM | POA: Diagnosis not present

## 2021-12-04 DIAGNOSIS — G3184 Mild cognitive impairment, so stated: Secondary | ICD-10-CM | POA: Diagnosis not present

## 2021-12-08 ENCOUNTER — Inpatient Hospital Stay (HOSPITAL_COMMUNITY)
Admission: EM | Admit: 2021-12-08 | Discharge: 2021-12-13 | DRG: 247 | Disposition: A | Discharge status: Home / Self Care | Payer: No Typology Code available for payment source | Attending: Internal Medicine | Admitting: Internal Medicine

## 2021-12-08 ENCOUNTER — Emergency Department (HOSPITAL_COMMUNITY): Payer: No Typology Code available for payment source

## 2021-12-08 ENCOUNTER — Other Ambulatory Visit: Payer: Self-pay

## 2021-12-08 DIAGNOSIS — F03B0 Unspecified dementia, moderate, without behavioral disturbance, psychotic disturbance, mood disturbance, and anxiety: Secondary | ICD-10-CM | POA: Diagnosis not present

## 2021-12-08 DIAGNOSIS — M0609 Rheumatoid arthritis without rheumatoid factor, multiple sites: Secondary | ICD-10-CM

## 2021-12-08 DIAGNOSIS — I454 Nonspecific intraventricular block: Secondary | ICD-10-CM | POA: Diagnosis not present

## 2021-12-08 DIAGNOSIS — Z6831 Body mass index (BMI) 31.0-31.9, adult: Secondary | ICD-10-CM

## 2021-12-08 DIAGNOSIS — I447 Left bundle-branch block, unspecified: Secondary | ICD-10-CM | POA: Diagnosis not present

## 2021-12-08 DIAGNOSIS — Z955 Presence of coronary angioplasty implant and graft: Secondary | ICD-10-CM

## 2021-12-08 DIAGNOSIS — R1110 Vomiting, unspecified: Secondary | ICD-10-CM | POA: Diagnosis present

## 2021-12-08 DIAGNOSIS — Z79631 Long term (current) use of antimetabolite agent: Secondary | ICD-10-CM

## 2021-12-08 DIAGNOSIS — Z79899 Other long term (current) drug therapy: Secondary | ICD-10-CM

## 2021-12-08 DIAGNOSIS — Z888 Allergy status to other drugs, medicaments and biological substances status: Secondary | ICD-10-CM

## 2021-12-08 DIAGNOSIS — Z85038 Personal history of other malignant neoplasm of large intestine: Secondary | ICD-10-CM

## 2021-12-08 DIAGNOSIS — M069 Rheumatoid arthritis, unspecified: Secondary | ICD-10-CM | POA: Diagnosis present

## 2021-12-08 DIAGNOSIS — I491 Atrial premature depolarization: Secondary | ICD-10-CM | POA: Diagnosis not present

## 2021-12-08 DIAGNOSIS — I255 Ischemic cardiomyopathy: Secondary | ICD-10-CM | POA: Diagnosis present

## 2021-12-08 DIAGNOSIS — I2510 Atherosclerotic heart disease of native coronary artery without angina pectoris: Principal | ICD-10-CM | POA: Diagnosis present

## 2021-12-08 DIAGNOSIS — R0902 Hypoxemia: Secondary | ICD-10-CM | POA: Diagnosis not present

## 2021-12-08 DIAGNOSIS — R55 Syncope and collapse: Secondary | ICD-10-CM | POA: Diagnosis present

## 2021-12-08 DIAGNOSIS — N179 Acute kidney failure, unspecified: Secondary | ICD-10-CM | POA: Diagnosis not present

## 2021-12-08 DIAGNOSIS — R609 Edema, unspecified: Secondary | ICD-10-CM

## 2021-12-08 DIAGNOSIS — R7989 Other specified abnormal findings of blood chemistry: Secondary | ICD-10-CM

## 2021-12-08 DIAGNOSIS — Z006 Encounter for examination for normal comparison and control in clinical research program: Secondary | ICD-10-CM

## 2021-12-08 DIAGNOSIS — R079 Chest pain, unspecified: Secondary | ICD-10-CM | POA: Diagnosis not present

## 2021-12-08 DIAGNOSIS — F0390 Unspecified dementia without behavioral disturbance: Secondary | ICD-10-CM | POA: Diagnosis present

## 2021-12-08 DIAGNOSIS — Z800 Family history of malignant neoplasm of digestive organs: Secondary | ICD-10-CM

## 2021-12-08 DIAGNOSIS — E669 Obesity, unspecified: Secondary | ICD-10-CM | POA: Diagnosis present

## 2021-12-08 DIAGNOSIS — E785 Hyperlipidemia, unspecified: Secondary | ICD-10-CM | POA: Diagnosis present

## 2021-12-08 DIAGNOSIS — R410 Disorientation, unspecified: Secondary | ICD-10-CM | POA: Diagnosis not present

## 2021-12-08 DIAGNOSIS — I5020 Unspecified systolic (congestive) heart failure: Secondary | ICD-10-CM

## 2021-12-08 DIAGNOSIS — R001 Bradycardia, unspecified: Secondary | ICD-10-CM | POA: Diagnosis present

## 2021-12-08 LAB — COMPREHENSIVE METABOLIC PANEL
ALT: 23 U/L (ref 0–44)
AST: 31 U/L (ref 15–41)
Albumin: 3.6 g/dL (ref 3.5–5.0)
Alkaline Phosphatase: 53 U/L (ref 38–126)
Anion gap: 6 (ref 5–15)
BUN: 19 mg/dL (ref 8–23)
CO2: 26 mmol/L (ref 22–32)
Calcium: 8.6 mg/dL — ABNORMAL LOW (ref 8.9–10.3)
Chloride: 107 mmol/L (ref 98–111)
Creatinine, Ser: 1.29 mg/dL — ABNORMAL HIGH (ref 0.61–1.24)
GFR, Estimated: 57 mL/min — ABNORMAL LOW (ref >60)
Glucose, Bld: 124 mg/dL — ABNORMAL HIGH (ref 70–99)
Potassium: 4.6 mmol/L (ref 3.5–5.1)
Sodium: 139 mmol/L (ref 135–145)
Total Bilirubin: 0.5 mg/dL (ref 0.3–1.2)
Total Protein: 5.5 g/dL — ABNORMAL LOW (ref 6.5–8.1)

## 2021-12-08 LAB — CBC WITH DIFFERENTIAL/PLATELET
Abs Immature Granulocytes: 0.02 10*3/uL (ref 0.00–0.07)
Basophils Absolute: 0 10*3/uL (ref 0.0–0.1)
Basophils Relative: 1 %
Eosinophils Absolute: 0.1 10*3/uL (ref 0.0–0.5)
Eosinophils Relative: 2 %
HCT: 43.6 % (ref 39.0–52.0)
Hemoglobin: 14.8 g/dL (ref 13.0–17.0)
Immature Granulocytes: 1 %
Lymphocytes Relative: 24 %
Lymphs Abs: 1 10*3/uL (ref 0.7–4.0)
MCH: 31.2 pg (ref 26.0–34.0)
MCHC: 33.9 g/dL (ref 30.0–36.0)
MCV: 92 fL (ref 80.0–100.0)
Monocytes Absolute: 0.5 10*3/uL (ref 0.1–1.0)
Monocytes Relative: 11 %
Neutro Abs: 2.7 10*3/uL (ref 1.7–7.7)
Neutrophils Relative %: 61 %
Platelets: 209 10*3/uL (ref 150–400)
RBC: 4.74 MIL/uL (ref 4.22–5.81)
RDW: 13.1 % (ref 11.5–15.5)
WBC: 4.3 10*3/uL (ref 4.0–10.5)
nRBC: 0 % (ref 0.0–0.2)

## 2021-12-08 LAB — TROPONIN I (HIGH SENSITIVITY)
Troponin I (High Sensitivity): 10 ng/L (ref <18)
Troponin I (High Sensitivity): 10 ng/L (ref <18)

## 2021-12-08 LAB — BRAIN NATRIURETIC PEPTIDE: B Natriuretic Peptide: 126.7 pg/mL — ABNORMAL HIGH (ref 0.0–100.0)

## 2021-12-08 MED ORDER — ENOXAPARIN SODIUM 40 MG/0.4ML IJ SOSY
40.0000 mg | PREFILLED_SYRINGE | INTRAMUSCULAR | Status: DC
Start: 2021-12-08 — End: 2021-12-13
  Administered 2021-12-08 – 2021-12-12 (×5): 40 mg via SUBCUTANEOUS
  Filled 2021-12-08 (×5): qty 0.4

## 2021-12-08 MED ORDER — LACTATED RINGERS IV BOLUS
500.0000 mL | Freq: Once | INTRAVENOUS | Status: AC
  Administered 2021-12-08: 500 mL via INTRAVENOUS

## 2021-12-08 MED ORDER — SODIUM CHLORIDE 0.9% FLUSH
3.0000 mL | Freq: Two times a day (BID) | INTRAVENOUS | Status: DC
  Administered 2021-12-08 – 2021-12-11 (×5): 3 mL via INTRAVENOUS

## 2021-12-08 NOTE — ED Provider Notes (Signed)
[facility] EMERGENCY DEPARTMENT Provider Note   CSN: [id number] Arrival date & time: 12/08/21  1542     History  Chief Complaint  Patient presents with  . Near Syncope    Troy [name] is a 76 y.o. male history of dementia and rheumatoid arthritis presenting to the emergency department after syncope.  Patient was sitting outside when he developed lightheadedness, nausea, decreased responsiveness.  Patient does not remember himself the events but does not think he had chest pain.  He was helped by a friend, may have had a brief loss of consciousness per wife.  Afterwards he seemed to have some persistent confusion and generalized weakness.  On EMS arrival, the patient was diaphoretic with a heart rate in the 40s.  Patient currently reports he feels well and denies any symptoms such as chest pain, shortness of breath, abdominal pain, headache, ongoing nausea, vision changes, weakness or tingling.   Near Syncope       Home Medications Prior to Admission medications   Medication Sig Start Date End Date Taking? Authorizing Provider  baricitinib (OLUMIANT) 2 MG TABS tablet Take 2 mg by mouth daily.   Yes [provider]  donepezil (ARICEPT) 5 MG tablet TAKE ONE TABLET BY MOUTH DAILY FOR MEMORY 05/08/21  Yes [provider]  Elderberry 575 MG/5ML SYRP Take 5 mLs by mouth daily.   Yes [provider]  folic acid (FOLVITE) 1 MG tablet Take 1 mg by mouth daily. 06/17/19  Yes [provider]  memantine (NAMENDA) 10 MG tablet Take 1 tablet (10 mg total) by mouth 2 (two) times daily. 01/09/21  Yes Tower, Audrie Gallus, MD  methotrexate (RHEUMATREX) 2.5 MG tablet Take 15 mg by mouth once a week. Take 6 tablets (15 mg) on Saturdays 08/02/19  Yes [provider]  mirabegron ER (MYRBETRIQ) 25 MG TB24 tablet Take 25 mg by mouth daily.   Yes [provider]  ciclopirox (LOPROX) 0.77 % cream Apply topically 2 (two) times daily. To affected  areas Patient not taking: Reported on 12/08/2021 07/20/21   Tower, Audrie Gallus, MD  clotrimazole-betamethasone (LOTRISONE) cream APPLY 1 APPLICATION TOPICALLY 2 (TWO) TIMES DAILY AS NEEDED. FOR JOCK [facility] FLARE FOR 10 DAYS Patient not taking: Reported on 12/08/2021 07/02/21   Tower, Audrie Gallus, MD  nystatin (MYCOSTATIN/NYSTOP) powder Apply 1 application topically 3 (three) times daily as needed. For jock itch and to prevent it Patient not taking: Reported on 12/08/2021 01/09/21   Tower, Idamae Schuller A, MD  rosuvastatin (CRESTOR) 5 MG tablet TAKE 1 TABLET (5 MG TOTAL) BY MOUTH DAILY. Patient not taking: Reported on 12/08/2021 06/29/21   Judy Pimple, MD      Allergies    Chlorzoxazone and Elemental sulfur    Review of Systems   Review of Systems  Cardiovascular:  Positive for near-syncope.  See HPI  Physical Exam Updated Vital Signs BP 138/90 (BP Location: Right Arm)   Pulse 73   Temp 97.8 F (36.6 C) (Oral)   Resp 16   Ht 6' 1.5" (1.867 m)   Wt 111.5 kg   SpO2 100%   BMI 31.99 kg/m  Physical Exam Vitals and nursing note reviewed.  Constitutional:      General: He is not in acute distress.    Appearance: Normal appearance.  HENT:     Mouth/Throat:     Mouth: Mucous membranes are moist.  Cardiovascular:     Rate and Rhythm: Regular rhythm. Bradycardia present.  Pulmonary:  Effort: Pulmonary effort is normal. No respiratory distress.     Breath sounds: Normal breath sounds.  Abdominal:     General: Abdomen is flat.     Palpations: Abdomen is soft.     Tenderness: There is no abdominal tenderness.  Musculoskeletal:     Comments: Trace bilateral lower extremity edema  Skin:    General: Skin is warm and dry.     Capillary Refill: Capillary refill takes less than 2 seconds.  Neurological:     Mental Status: He is alert and oriented to person, place, and time. Mental status is at baseline.  Psychiatric:        Mood and Affect: Mood normal.        Behavior: Behavior normal.     ED  Results / Procedures / Treatments   Labs (all labs ordered are listed, but only abnormal results are displayed) Labs Reviewed  COMPREHENSIVE METABOLIC PANEL - Abnormal; Notable for the following components:      Result Value   Glucose, Bld 124 (*)    Creatinine, Ser 1.29 (*)    Calcium 8.6 (*)    Total Protein 5.5 (*)    GFR, Estimated 57 (*)    All other components within normal limits  BRAIN NATRIURETIC PEPTIDE - Abnormal; Notable for the following components:   B Natriuretic Peptide 126.7 (*)    All other components within normal limits  CBC WITH DIFFERENTIAL/PLATELET  BASIC METABOLIC PANEL  TROPONIN I (HIGH SENSITIVITY)  TROPONIN I (HIGH SENSITIVITY)    EKG EKG Interpretation  Date/Time:  Saturday December 08 2021 15:43:52 EDT Ventricular Rate:  54 PR Interval:  193 QRS Duration: 144 QT Interval:  459 QTC Calculation: 435 R Axis:   -55 Text Interpretation: Sinus rhythm Ventricular premature complex Left bundle branch block Confirmed by Garnette Gunner [phone]) on 12/08/2021 4:49:34 PM  Radiology DG Chest 1 View  Result Date: 12/08/2021 CLINICAL DATA:  chest pain EXAM: CHEST  1 VIEW COMPARISON:  November 04, 2019 FINDINGS: The heart size and mediastinal contours are within normal limits. Stable ectatic thoracic aorta. Both lungs are clear and stable. The visualized skeletal structures are unremarkable. IMPRESSION: No active disease. Electronically Signed   By: Frazier Richards M.D.   On: 12/08/2021 17:03    Procedures .1-3 Lead EKG Interpretation  Performed by: Cristie Hem, MD Authorized by: Cristie Hem, MD     Interpretation: abnormal     ECG rate assessment: bradycardic     Rhythm: sinus rhythm     Ectopy: none       Medications Ordered in ED Medications  sodium chloride flush (NS) 0.9 % injection 3 mL (has no administration in time range)  enoxaparin (LOVENOX) injection 40 mg (has no administration in time range)  lactated ringers bolus 500 mL (has no  administration in time range)    ED Course/ Medical Decision Making/ A&P Clinical Course as of 12/08/21 2210  Sat Dec 08, 2021  2209 Labs notable for mild elevation in BNP  [WS]  2209 Given elevated BNP, lower extremity edema, patient also likely needs echocardiogram. Otherwise reassuring labs. Discussed with hospitalist Dr. Flossie Buffy, who will admit the patient for further observation and workup. [WS]    Clinical Course User Index [WS] Cristie Hem, MD                           Medical Decision Making Amount and/or Complexity of Data Reviewed Labs: ordered.  Radiology: ordered.  Risk Decision regarding hospitalization.   76 year old male presenting to the emergency department after syncope.  Patient well-appearing, vitals notable for bradycardia.  No hypotension.  EKG appears to show sinus bradycardia.  Differential includes vasovagal syncope from being outside, cardiogenic syncope given bradycardia per EMS.  Low concern for anemia, patient denies black or tarry stools, but will check CBC.  Discussed with patient's wife who is his caretaker and witnessed the episode.  She denies any history of similar episodes in the past.  Given age and new onset syncope without clear trigger, patient may need admission for observation and further testing even if work-up is reassuring.  Final Clinical Impression(s) / ED Diagnoses Final diagnoses:  Syncope and collapse  Sinus bradycardia  Elevated brain natriuretic peptide (BNP) level  Peripheral edema    Rx / DC Orders ED Discharge Orders     None         Lonell Grandchild, MD 12/08/21 2210

## 2021-12-08 NOTE — ED Notes (Signed)
ED TO INPATIENT HANDOFF REPORT  ED Nurse Name and Phone #: [phone]  S Name/Age/Gender Troy [name] 76 y.o. male Room/Bed: 027C/027C  Code Status   Code Status: Not on file  Home/SNF/Other Home Patient oriented to: self, place, and situation Is this baseline? Yes  (Has dementia. Wasn't able tot ell me accurate time)  Triage Complete: Triage complete  Chief Complaint Syncope [R55]  Triage Note PT BIB EMS after having a near-syncope event. Pt's wife claims pt was sitting outside when she found him slumped over and and pale. Upon EMS arrival pt was found to be diaphoretic with a HR in the 40's. Pt complains only of dizziness and denies CP, SOB.    Allergies Allergies  Allergen Reactions   Chlorzoxazone     REACTION: dizzy   Elemental Sulfur Rash    Level of Care/Admitting Diagnosis ED Disposition     ED Disposition  Admit   Condition  --   Comment  Hospital Area: [facility] [[id number]]  Level of Care: Telemetry Cardiac [103]  May place patient in observation at [facility] or [facility] if equivalent level of care is available:: No  Covid Evaluation: Asymptomatic - no recent exposure (last 10 days) testing not required  Diagnosis: Syncope [206001]  Admitting Physician: Anselm Jungling [[id number]]  Attending Physician: Anselm Jungling [[id number]]          B Medical/Surgery History No past medical history on file. Past Surgical History:  Procedure Laterality Date   BACK SURGERY  04/1998   LIPOMA EXCISION  09/2009   ROTATOR CUFF REPAIR Left 2012   TONSILLECTOMY AND ADENOIDECTOMY  age 38     A IV Location/Drains/Wounds Patient Lines/Drains/Airways Status     Active Line/Drains/Airways     Name Placement date Placement time Site Days   Peripheral IV 12/08/21 18 G Anterior;Distal;Left;Upper Antecubital 12/08/21  1544  Antecubital  less than 1            Intake/Output Last 24 hours No intake or output data in the 24 hours ending 12/08/21  1941  Labs/Imaging Results for orders placed or performed during the hospital encounter of 12/08/21 (from the past 48 hour(s))  Comprehensive metabolic panel     Status: Abnormal   Collection Time: 12/08/21  3:45 PM  Result Value Ref Range   Sodium 139 135 - 145 mmol/L   Potassium 4.6 3.5 - 5.1 mmol/L   Chloride 107 98 - 111 mmol/L   CO2 26 22 - 32 mmol/L   Glucose, Bld 124 (H) 70 - 99 mg/dL    Comment: Glucose reference range applies only to samples taken after fasting for at least 8 hours.   BUN 19 8 - 23 mg/dL   Creatinine, Ser 9.38 (H) 0.61 - 1.24 mg/dL   Calcium 8.6 (L) 8.9 - 10.3 mg/dL   Total Protein 5.5 (L) 6.5 - 8.1 g/dL   Albumin 3.6 3.5 - 5.0 g/dL   AST 31 15 - 41 U/L   ALT 23 0 - 44 U/L   Alkaline Phosphatase 53 38 - 126 U/L   Total Bilirubin 0.5 0.3 - 1.2 mg/dL   GFR, Estimated 57 (L) >60 mL/min    Comment: (NOTE) Calculated using the CKD-EPI Creatinine Equation (2021)    Anion gap 6 5 - 15    Comment: Performed at [facility], [address] [place], [place] [address]  CBC with Differential     Status: None   Collection Time:  12/08/21  3:45 PM  Result Value Ref Range   WBC 4.3 4.0 - 10.5 K/uL   RBC 4.74 4.22 - 5.81 MIL/uL   Hemoglobin 14.8 13.0 - 17.0 g/dL   HCT 28.3 15.1 - 76.1 %   MCV 92.0 80.0 - 100.0 fL   MCH 31.2 26.0 - 34.0 pg   MCHC 33.9 30.0 - 36.0 g/dL   RDW 60.7 37.1 - 06.2 %   Platelets 209 150 - 400 K/uL   nRBC 0.0 0.0 - 0.2 %   Neutrophils Relative % 61 %   Neutro Abs 2.7 1.7 - 7.7 K/uL   Lymphocytes Relative 24 %   Lymphs Abs 1.0 0.7 - 4.0 K/uL   Monocytes Relative 11 %   Monocytes Absolute 0.5 0.1 - 1.0 K/uL   Eosinophils Relative 2 %   Eosinophils Absolute 0.1 0.0 - 0.5 K/uL   Basophils Relative 1 %   Basophils Absolute 0.0 0.0 - 0.1 K/uL   Immature Granulocytes 1 %   Abs Immature Granulocytes 0.02 0.00 - 0.07 K/uL    Comment: Performed at [facility], [address], [place], [place] [address]  Brain natriuretic  peptide     Status: Abnormal   Collection Time: 12/08/21  3:45 PM  Result Value Ref Range   B Natriuretic Peptide 126.7 (H) 0.0 - 100.0 pg/mL    Comment: Performed at [facility], [address], [place], [place] [address]  Troponin I (High Sensitivity)     Status: None   Collection Time: 12/08/21  3:45 PM  Result Value Ref Range   Troponin I (High Sensitivity) 10 <18 ng/L    Comment: (NOTE) Elevated high sensitivity troponin I (hsTnI) values and significant  changes across serial measurements may suggest ACS but many other  chronic and acute conditions are known to elevate hsTnI results.  Refer to the "Links" section for chest pain algorithms and additional  guidance. Performed at [facility], [address] [place], [place] [address]    DG Chest 1 View  Result Date: 12/08/2021 CLINICAL DATA:  chest pain EXAM: CHEST  1 VIEW COMPARISON:  November 04, 2019 FINDINGS: The heart size and mediastinal contours are within normal limits. Stable ectatic thoracic aorta. Both lungs are clear and stable. The visualized skeletal structures are unremarkable. IMPRESSION: No active disease. Electronically Signed   By: Marjo Bicker M.D.   On: 12/08/2021 17:03    Pending Labs Unresulted Labs (From admission, onward)    None       Vitals/Pain Today's Vitals   12/08/21 1730 12/08/21 1735 12/08/21 1745 12/08/21 1800  BP: 121/78  124/74 127/72  Pulse: (!) 52  (!) 54 (!) 54  Resp: 16  19 (!) 27  Temp:      TempSrc:      SpO2: 98%  100% 100%  Weight:  111.3 kg    Height:  6' 1.5" (1.867 m)    PainSc:        Isolation Precautions No active isolations  Medications Medications - No data to display  Mobility manual wheelchair High fall risk     R Recommendations: See Admitting Provider Note  Report given to: lori isley RN  Additional Notes:

## 2021-12-08 NOTE — ED Triage Notes (Signed)
PT BIB EMS after having a near-syncope event. Pt's wife claims pt was sitting outside when she found him slumped over and and pale. Upon EMS arrival pt was found to be diaphoretic with a HR in the 40's. Pt complains only of dizziness and denies CP, SOB.

## 2021-12-08 NOTE — Assessment & Plan Note (Signed)
He is alert and oriented to self, place, family member at bedside but not time. -Continue Aricept and Namenda

## 2021-12-08 NOTE — Assessment & Plan Note (Signed)
Continue methotrexate. -pt also part of a clinical trial on an immunomodulators

## 2021-12-08 NOTE — ED Notes (Signed)
Help get patient undressed on the monitor patient is resting with call bell in reach 

## 2021-12-08 NOTE — Assessment & Plan Note (Addendum)
Based on history, there is concerns that it could be cardiogenic.  He is noted to be bradycardic in the 50s and was found to be in the 40s by EMS in the field.  On review of past documentations outpatient he has been bradycardic in the 50s at least in the past few months. -EKG is showing LBBB with some changes compared to prior in terms of worsening intraventricular delay -Obtain echocardiogram and keep on continuous telemetry -Obtain orthostatic vital signs although has remained normotensive at rest here

## 2021-12-08 NOTE — Assessment & Plan Note (Signed)
Creatinine is elevated to 1.29.  Give 500 cc LR bolus and follow repeat in the morning

## 2021-12-08 NOTE — H&P (Addendum)
History and Physical    Patient: Troy [name] DOB: 10-15-1945 DOA: 12/08/2021 DOS: the patient was seen and examined on 12/09/2021 PCP: Judy Pimple, MD  Patient coming from: Home  Chief Complaint:  Chief Complaint  Patient presents with   Near Syncope   HPI: Troy [name] is a 76 y.o. male with medical history significant of dementia, RA on methotrexate, HLD presents with syncope.   Wife at bedside provides history as patient is unable to recall event.  Earlier this morning, patient was sitting on the bed of his truck outside in the shade watching a friend fix his mower.  He later felt dizzy and had witnessed loss of consciousness by his friend.  Wife then came out and saw him vomiting with mucus coming of his nose.  He appeared "glassy eyed" and seemed confused and did not respond to his wife.  He appeared dizzy like he was going to lose consciousness a second time.  He was otherwise in his normal state of health.  Had breakfast before the episode. Only medical changes is wife took him off statin about a week ago since he was having myalgias and difficulty walking. Symptoms resolved once it was discontinued.   In the ED, he was afebrile, bradycardic to the 50s and normotensive on room air. No leukocytosis, anemia. Has mild AKI with creatinine of 1.29.    CXR is negative. EKG with LBBB with IVCD already seen in older EKG In 2011.    Review of Systems: unable to review all systems due to the inability of the patient to answer questions. No past medical history on file. Past Surgical History:  Procedure Laterality Date   BACK SURGERY  04/1998   LIPOMA EXCISION  09/2009   ROTATOR CUFF REPAIR Left 2012   TONSILLECTOMY AND ADENOIDECTOMY  age 42   Social History:  reports that he has never smoked. He has been exposed to tobacco smoke. He has never used smokeless tobacco. He reports that he does not drink alcohol and does not use drugs.  Allergies  Allergen Reactions    Chlorzoxazone     REACTION: dizzy   Elemental Sulfur Rash    Family History  Problem Relation Age of Onset   Dementia Mother    Colon cancer Father 48    Prior to Admission medications   Medication Sig Start Date End Date Taking? Authorizing Provider  baricitinib (OLUMIANT) 2 MG TABS tablet Take 2 mg by mouth daily.   Yes [provider]  donepezil (ARICEPT) 5 MG tablet TAKE ONE TABLET BY MOUTH DAILY FOR MEMORY 05/08/21  Yes [provider]  Elderberry 575 MG/5ML SYRP Take 5 mLs by mouth daily.   Yes [provider]  folic acid (FOLVITE) 1 MG tablet Take 1 mg by mouth daily. 06/17/19  Yes [provider]  memantine (NAMENDA) 10 MG tablet Take 1 tablet (10 mg total) by mouth 2 (two) times daily. 01/09/21  Yes Tower, Audrie Gallus, MD  methotrexate (RHEUMATREX) 2.5 MG tablet Take 15 mg by mouth once a week. Take 6 tablets (15 mg) on Saturdays 08/02/19  Yes [provider]  mirabegron ER (MYRBETRIQ) 25 MG TB24 tablet Take 25 mg by mouth daily.   Yes [provider]  ciclopirox (LOPROX) 0.77 % cream Apply topically 2 (two) times daily. To affected areas Patient not taking: Reported on 12/08/2021 07/20/21   Tower, Audrie Gallus, MD  clotrimazole-betamethasone (LOTRISONE) cream APPLY 1 APPLICATION TOPICALLY 2 (TWO) TIMES DAILY  AS NEEDED. FOR JOCK [facility] FLARE FOR 10 DAYS Patient not taking: Reported on 12/08/2021 07/02/21   Tower, Audrie Gallus, MD  nystatin (MYCOSTATIN/NYSTOP) powder Apply 1 application topically 3 (three) times daily as needed. For jock itch and to prevent it Patient not taking: Reported on 12/08/2021 01/09/21   Tower, Idamae Schuller A, MD  rosuvastatin (CRESTOR) 5 MG tablet TAKE 1 TABLET (5 MG TOTAL) BY MOUTH DAILY. Patient not taking: Reported on 12/08/2021 06/29/21   Judy Pimple, MD    Physical Exam: Vitals:   12/08/21 2120 12/09/21 0003 12/09/21 0005 12/09/21 0007  BP: 138/90 128/78 117/84 107/70  Pulse: 73     Resp: 16     Temp: 97.8 F (36.6 C)      TempSrc: Oral     SpO2: 100%     Weight: 111.5 kg     Height:       Constitutional: NAD, calm, comfortable, pleasantly demented elderly male laying in bed Eyes:  lids and conjunctivae normal ENMT: Mucous membranes are moist.  Neck: normal, supple Respiratory: clear to auscultation bilaterally, no wheezing, no crackles. Normal respiratory effort. No accessory muscle use.  Cardiovascular: Regular rate and rhythm, no murmurs / rubs / gallops. No extremity edema.   Abdomen: no tenderness, Bowel sounds positive.  Musculoskeletal: no clubbing / cyanosis. No joint deformity upper and lower extremities. Good ROM, no contractures. Normal muscle tone.  Skin: no rashes, lesions, ulcers.  Neurologic: CN 2-12 grossly intact. Strength 5/5 in all 4.  Alert and oriented only to self, place, family member at bedside, but not time. Psychiatric: Pleasant mood.    Data Reviewed:  See HPI  Assessment and Plan: * Syncope Based on history, there is concerns that it could be cardiogenic.  He is noted to be bradycardic in the 50s and was found to be in the 40s by EMS in the field.  On review of past documentations outpatient he has been bradycardic in the 50s at least in the past few months. -EKG is showing LBBB with some changes compared to prior in terms of worsening intraventricular delay -Obtain echocardiogram and keep on continuous telemetry -Obtain orthostatic vital signs although has remained normotensive at rest here  AKI (acute kidney injury) (HCC) Creatinine is elevated to 1.29.  Give 500 cc LR bolus and follow repeat in the morning  Dementia (HCC) He is alert and oriented to self, place, family member at bedside but not time. -Continue Aricept and Namenda  Rheumatoid arthritis (HCC) Continue methotrexate. -pt also part of a clinical trial on Baricitinib      Advance Care Planning: Full code-confirmed with wife and daughter at bedside  Consults: none  Family Communication:  Discussed with wife and daughter at bedside  Severity of Illness: The appropriate patient status for this patient is OBSERVATION. Observation status is judged to be reasonable and necessary in order to provide the required intensity of service to ensure the patient's safety. The patient's presenting symptoms, physical exam findings, and initial radiographic and laboratory data in the context of their medical condition is felt to place them at decreased risk for further clinical deterioration. Furthermore, it is anticipated that the patient will be medically stable for discharge from the hospital within 2 midnights of admission.   Author: Anselm Jungling, DO 12/09/2021 3:00 AM  For on call review [email or web address].

## 2021-12-09 ENCOUNTER — Encounter (HOSPITAL_COMMUNITY): Payer: Self-pay | Admitting: Family Medicine

## 2021-12-09 ENCOUNTER — Observation Stay (HOSPITAL_BASED_OUTPATIENT_CLINIC_OR_DEPARTMENT_OTHER): Payer: No Typology Code available for payment source

## 2021-12-09 DIAGNOSIS — R55 Syncope and collapse: Secondary | ICD-10-CM

## 2021-12-09 LAB — ECHOCARDIOGRAM COMPLETE
Area-P 1/2: 2.32 cm2
Calc EF: 44.5 %
Height: 73.5 in
P 1/2 time: 719 msec
S' Lateral: 4.4 cm
Single Plane A2C EF: 47.8 %
Single Plane A4C EF: 38 %
Weight: 3801.6 oz

## 2021-12-09 LAB — BASIC METABOLIC PANEL
Anion gap: 7 (ref 5–15)
BUN: 21 mg/dL (ref 8–23)
CO2: 24 mmol/L (ref 22–32)
Calcium: 8.6 mg/dL — ABNORMAL LOW (ref 8.9–10.3)
Chloride: 107 mmol/L (ref 98–111)
Creatinine, Ser: 1.23 mg/dL (ref 0.61–1.24)
GFR, Estimated: >60 mL/min (ref >60)
Glucose, Bld: 118 mg/dL — ABNORMAL HIGH (ref 70–99)
Potassium: 4.1 mmol/L (ref 3.5–5.1)
Sodium: 138 mmol/L (ref 135–145)

## 2021-12-09 LAB — GLUCOSE, CAPILLARY: Glucose-Capillary: 105 mg/dL — ABNORMAL HIGH (ref 70–99)

## 2021-12-09 MED ORDER — MIRABEGRON ER 25 MG PO TB24
25.0000 mg | ORAL_TABLET | Freq: Every day | ORAL | Status: DC
  Administered 2021-12-09 – 2021-12-13 (×5): 25 mg via ORAL
  Filled 2021-12-09 (×5): qty 1

## 2021-12-09 MED ORDER — BARICITINIB 2 MG PO TABS
2.0000 mg | ORAL_TABLET | Freq: Every day | ORAL | Status: DC
  Administered 2021-12-09 – 2021-12-13 (×5): 2 mg via ORAL
  Filled 2021-12-09 (×5): qty 1

## 2021-12-09 MED ORDER — FOLIC ACID 1 MG PO TABS
1.0000 mg | ORAL_TABLET | Freq: Every day | ORAL | Status: DC
  Administered 2021-12-09 – 2021-12-13 (×5): 1 mg via ORAL
  Filled 2021-12-09 (×5): qty 1

## 2021-12-09 MED ORDER — MEMANTINE HCL 10 MG PO TABS
10.0000 mg | ORAL_TABLET | Freq: Two times a day (BID) | ORAL | Status: DC
  Administered 2021-12-09 – 2021-12-13 (×9): 10 mg via ORAL
  Filled 2021-12-09 (×11): qty 1

## 2021-12-09 MED ORDER — DONEPEZIL HCL 5 MG PO TABS
5.0000 mg | ORAL_TABLET | Freq: Every day | ORAL | Status: DC
  Administered 2021-12-09 – 2021-12-12 (×4): 5 mg via ORAL
  Filled 2021-12-09 (×4): qty 1

## 2021-12-09 NOTE — Progress Notes (Signed)
  Echocardiogram 2D Echocardiogram has been performed.  Troy [name] 12/09/2021, 11:05 AM

## 2021-12-09 NOTE — Progress Notes (Signed)
PROGRESS NOTE  Troy [name]  PRF:[id number] DOB: 1945/12/05 DOA: 12/08/2021 PCP: Judy Pimple, MD   Brief Narrative:  Patient is a 76 year old male with history of dementia, rheumatoid arthritis on methotrexate, hyperlipidemia who presented with syncope.  As per the report, patient was sitting on the bed of his truck outside in the shade watching a friend fix his mower.  He later felt dizzy and had witnessed loss of consciousness by his friend.  Wife then came out and saw him vomiting with mucus coming of his nose.  On condition he was bradycardic to 50s, normotensive.  Labs showed mild AKI with creatinine of 1.2.  Chest x-ray did not show any pneumonia or other findings.  EKG showed left bundle branch block which is not a new finding.  Patient admitted for syncopal management.  Assessment & Plan:  Principal Problem:   Syncope Active Problems:   Rheumatoid arthritis (HCC)   Dementia (HCC)   AKI (acute kidney injury) (HCC)   Syncope: Events as above.  Currently he is hemodynamically stable.  Monitor on telemetry.  He is mildly bradycardic without any symptoms at present. EKG shows left bundle branch block which is not a new finding.  Echo is pending.  Orthostatic vitals were found to be negative when checked out this morning.  We will also request a PT/OT evaluation.  AKI: Resolved with IV fluids  Dementia: Alert and oriented to place but not to time.  Continue supportive care.  Delirium precautions.  Continue Aricept, Namenda  Rheumatoid arthritis: On methotrexate.  Also on clinical trial with baricitinib        DVT prophylaxis:enoxaparin (LOVENOX) injection 40 mg Start: 12/08/21 1945     Code Status: Full Code  Family Communication: wife at bedside  Patient status:Obs  Patient is from :Home  Anticipated discharge DJ:TTSV  Estimated DC date:tomorrow   Consultants: None  Procedures:None  Antimicrobials:  Anti-infectives (From admission, onward)    None        Subjective: Patient seen and examined at the bedside this morning.  Hemodynamically stable.  Lying in bed.  Remains comfortable.  Confused to time but oriented to place.  Wife at bedside.  Not in any kind of distress  Objective: Vitals:   12/09/21 0323 12/09/21 0647 12/09/21 0925 12/09/21 0930  BP: 113/67  99/73 (!) 101/55  Pulse:      Resp:   18   Temp: 98.7 F (37.1 C)  97.8 F (36.6 C)   TempSrc: Oral  Oral   SpO2: 97%  98%   Weight:  107.8 kg    Height:       No intake or output data in the 24 hours ending 12/09/21 0956 Filed Weights   12/08/21 1735 12/08/21 2120 12/09/21 0647  Weight: 111.3 kg 111.5 kg 107.8 kg    Examination:  General exam: Overall comfortable, not in distress, obese HEENT: PERRL Respiratory system:  no wheezes or crackles  Cardiovascular system: S1 & S2 heard, RRR.  Gastrointestinal system: Abdomen is nondistended, soft and nontender. Central nervous system: Alert and awake, oriented to place only Extremities: No edema, no clubbing ,no cyanosis Skin: No rashes, no ulcers,no icterus     Data Reviewed: I have personally reviewed following labs and imaging studies  CBC: Recent Labs  Lab 12/08/21 1545  WBC 4.3  NEUTROABS 2.7  HGB 14.8  HCT 43.6  MCV 92.0  PLT 209   Basic Metabolic Panel: Recent Labs  Lab 12/08/21 1545 12/09/21 0316  NA  139 138  K 4.6 4.1  CL 107 107  CO2 26 24  GLUCOSE 124* 118*  BUN 19 21  CREATININE 1.29* 1.23  CALCIUM 8.6* 8.6*     No results found for this or any previous visit (from the past 240 hour(s)).   Radiology Studies: DG Chest 1 View  Result Date: 12/08/2021 CLINICAL DATA:  chest pain EXAM: CHEST  1 VIEW COMPARISON:  November 04, 2019 FINDINGS: The heart size and mediastinal contours are within normal limits. Stable ectatic thoracic aorta. Both lungs are clear and stable. The visualized skeletal structures are unremarkable. IMPRESSION: No active disease. Electronically Signed   By: Marjo Bicker  M.D.   On: 12/08/2021 17:03    Scheduled Meds:  baricitinib  2 mg Oral Daily   donepezil  5 mg Oral QHS   enoxaparin (LOVENOX) injection  40 mg Subcutaneous Q24H   folic acid  1 mg Oral Daily   memantine  10 mg Oral BID   mirabegron ER  25 mg Oral Daily   sodium chloride flush  3 mL Intravenous Q12H   Continuous Infusions:   LOS: 0 days   Burnadette Pop, MD [facility] P7/30/2023, 9:56 AM

## 2021-12-10 ENCOUNTER — Encounter (HOSPITAL_COMMUNITY): Payer: Self-pay | Admitting: Family Medicine

## 2021-12-10 DIAGNOSIS — R072 Precordial pain: Secondary | ICD-10-CM | POA: Diagnosis not present

## 2021-12-10 DIAGNOSIS — I25110 Atherosclerotic heart disease of native coronary artery with unstable angina pectoris: Secondary | ICD-10-CM | POA: Diagnosis not present

## 2021-12-10 DIAGNOSIS — R55 Syncope and collapse: Secondary | ICD-10-CM | POA: Diagnosis not present

## 2021-12-10 DIAGNOSIS — Z9861 Coronary angioplasty status: Secondary | ICD-10-CM | POA: Diagnosis not present

## 2021-12-10 DIAGNOSIS — N182 Chronic kidney disease, stage 2 (mild): Secondary | ICD-10-CM | POA: Diagnosis not present

## 2021-12-10 DIAGNOSIS — I5020 Unspecified systolic (congestive) heart failure: Secondary | ICD-10-CM

## 2021-12-10 LAB — BASIC METABOLIC PANEL
Anion gap: 6 (ref 5–15)
BUN: 21 mg/dL (ref 8–23)
CO2: 29 mmol/L (ref 22–32)
Calcium: 8.6 mg/dL — ABNORMAL LOW (ref 8.9–10.3)
Chloride: 105 mmol/L (ref 98–111)
Creatinine, Ser: 1.28 mg/dL — ABNORMAL HIGH (ref 0.61–1.24)
GFR, Estimated: 58 mL/min — ABNORMAL LOW (ref >60)
Glucose, Bld: 94 mg/dL (ref 70–99)
Potassium: 3.7 mmol/L (ref 3.5–5.1)
Sodium: 140 mmol/L (ref 135–145)

## 2021-12-10 LAB — MAGNESIUM: Magnesium: 2 mg/dL (ref 1.7–2.4)

## 2021-12-10 NOTE — Evaluation (Signed)
Occupational Therapy Evaluation Patient Details Name: Troy [name] MRN: [id number] DOB: 03-04-1946 Today's Date: 12/10/2021   History of Present Illness Pt is a 76 y/o male presenting on 7/29 with syncope. EKG with L bundle branch block but not a new finding. PMH includes: dementia, RA, back surgery, L rotator cuff repair.   Clinical Impression   PTA patient independent with mobility, supervision for ADLs due to dementia. Admitted for above and presents near baseline for ADLs and mobility in room, completing mobility to bathroom with supervision, toilet transfers with supervision, LB dressing with supervision and grooming with supervision.  Spouse at side and reports he has 24/7 support at home due to baseline dementia.  VSS on RA, BP stable.  No further OT needs identified at this time, OT will sign off.       Recommendations for follow up therapy are one component of a multi-disciplinary discharge planning process, led by the attending physician.  Recommendations may be updated based on patient status, additional functional criteria and insurance authorization.   Follow Up Recommendations  No OT follow up    Assistance Recommended at Discharge Frequent or constant Supervision/Assistance  Patient can return home with the following A little help with bathing/dressing/bathroom;Assistance with cooking/housework;Direct supervision/assist for medications management;Direct supervision/assist for financial management;Assist for transportation;Help with stairs or ramp for entrance    Functional Status Assessment     Equipment Recommendations  None recommended by OT    Recommendations for Other Services       Precautions / Restrictions Precautions Precautions: Fall Restrictions Weight Bearing Restrictions: No      Mobility Bed Mobility Overal bed mobility: Independent                  Transfers Overall transfer level: Needs assistance Equipment used: None Transfers: Sit  to/from Stand Sit to Stand: Supervision           General transfer comment: for safety      Balance Overall balance assessment: Mild deficits observed, not formally tested                                         ADL either performed or assessed with clinical judgement   ADL Overall ADL's : At baseline                                       General ADL Comments: supervision for ADLs, at baseline. able to complete tasks when guided to do so.     Vision   Vision Assessment?: No apparent visual deficits     Perception     Praxis      Pertinent Vitals/Pain Pain Assessment Pain Assessment: No/denies pain     Hand Dominance     Extremity/Trunk Assessment Upper Extremity Assessment Upper Extremity Assessment: RUE deficits/detail RUE Deficits / Details: limited shoulder movement due to rotator cuff tear, baseline   Lower Extremity Assessment Lower Extremity Assessment: Defer to PT evaluation       Communication Communication Communication: No difficulties   Cognition Arousal/Alertness: Awake/alert Behavior During Therapy: WFL for tasks assessed/performed Overall Cognitive Status: History of cognitive impairments - at baseline  General Comments: hx of dementia, follow simple commands     General Comments  spouse at side and supportive, VSS    Exercises     Shoulder Instructions      Home Living Family/patient expects to be discharged to:: Private residence Living Arrangements: Spouse/significant other Available Help at Discharge: Family;Available 24 hours/day Type of Home: House Home Access: Ramped entrance     Home Layout: One level     Bathroom Shower/Tub: Producer, television/film/video: Standard (comfort height)     Home Equipment: Grab bars - tub/shower;Grab bars - toilet;Shower Counsellor (2 wheels);BSC/3in1   Additional Comments: pt sleeps on the  couch      Prior Functioning/Environment Prior Level of Function : Needs assist  Cognitive Assist : ADLs (cognitive)   ADLs (Cognitive): Set up cues       Mobility Comments: independently ADLs Comments: supervision for ADLs (requires cueing to complete tasks, but is able to complete), spouse assists with IADLs        OT Problem List: Decreased activity tolerance;Decreased cognition;Decreased safety awareness      OT Treatment/Interventions:      OT Goals(Current goals can be found in the care plan section) Acute Rehab OT Goals Patient Stated Goal: home OT Goal Formulation: With patient/family  OT Frequency:      Co-evaluation              AM-PAC OT "6 Clicks" Daily Activity     Outcome Measure Help from another person eating meals?: A Little Help from another person taking care of personal grooming?: A Little Help from another person toileting, which includes using toliet, bedpan, or urinal?: A Little Help from another person bathing (including washing, rinsing, drying)?: A Little Help from another person to put on and taking off regular upper body clothing?: A Little Help from another person to put on and taking off regular lower body clothing?: A Little 6 Click Score: 18   End of Session Nurse Communication: Mobility status  Activity Tolerance: Patient tolerated treatment well Patient left: in chair;with call bell/phone within reach;with family/visitor present  OT Visit Diagnosis: Other abnormalities of gait and mobility (R26.89)                Time: 7829-5621 OT Time Calculation (min): 21 min Charges:  OT General Charges $OT Visit: 1 Visit OT Evaluation $OT Eval Low Complexity: 1 Low  Barry Brunner, OT Acute Rehabilitation Services Office [phone]   Troy [name] 12/10/2021, 10:40 AM

## 2021-12-10 NOTE — Consult Note (Signed)
Referring Physician: Shelly Coss, MD  Troy [name] is an 76 y.o. male.                       Chief Complaint: Syncope  HPI: 76 years old white male with PMH of Dementia, rheumatoid arthritis, colon cancer, Right shoulder rotator cuff repair had syncopal episode on admission. He was watching a friend fix his mower when he felt dizzy and lost consciousness. On regaining consciousness he was vomiting.  In ER he was mildly bradycardic and EKG showed LBBB. Echocardiogram yesterday showed anterior wall hypokinesia with 40-45 % EF. Troponin I levels were normal x 2. BNP was minimally elevated.  History reviewed. Pertinent past medical history as in HPI.    Past Surgical History:  Procedure Laterality Date   BACK SURGERY  04/1998   LIPOMA EXCISION  09/2009   ROTATOR CUFF REPAIR Left 2012   TONSILLECTOMY AND ADENOIDECTOMY  age 26    Family History  Problem Relation Age of Onset   Dementia Mother    Colon cancer Father 76   Social History:  reports that he has never smoked. He has been exposed to tobacco smoke. He has never used smokeless tobacco. He reports that he does not drink alcohol and does not use drugs.  Allergies:  Allergies  Allergen Reactions   Chlorzoxazone     REACTION: dizzy   Elemental Sulfur Rash    Medications Prior to Admission  Medication Sig Dispense Refill   baricitinib (OLUMIANT) 2 MG TABS tablet Take 2 mg by mouth daily.     donepezil (ARICEPT) 5 MG tablet TAKE ONE TABLET BY MOUTH DAILY FOR MEMORY     Elderberry 575 MG/5ML SYRP Take 5 mLs by mouth daily.     folic acid (FOLVITE) 1 MG tablet Take 1 mg by mouth daily.     memantine (NAMENDA) 10 MG tablet Take 1 tablet (10 mg total) by mouth 2 (two) times daily. 60 tablet 5   methotrexate (RHEUMATREX) 2.5 MG tablet Take 15 mg by mouth once a week. Take 6 tablets (15 mg) on Saturdays     mirabegron ER (MYRBETRIQ) 25 MG TB24 tablet Take 25 mg by mouth daily.     ciclopirox (LOPROX) 0.77 % cream Apply topically 2  (two) times daily. To affected areas (Patient not taking: Reported on 12/08/2021) 90 g 3   clotrimazole-betamethasone (LOTRISONE) cream APPLY 1 APPLICATION TOPICALLY 2 (TWO) TIMES DAILY AS NEEDED. FOR JOCK ITCH FLARE FOR 10 DAYS (Patient not taking: Reported on 12/08/2021) 30 g 1   nystatin (MYCOSTATIN/NYSTOP) powder Apply 1 application topically 3 (three) times daily as needed. For jock itch and to prevent it (Patient not taking: Reported on 12/08/2021) 60 g 2   rosuvastatin (CRESTOR) 5 MG tablet TAKE 1 TABLET (5 MG TOTAL) BY MOUTH DAILY. (Patient not taking: Reported on 12/08/2021) 90 tablet 1    Results for orders placed or performed during the hospital encounter of 12/08/21 (from the past 48 hour(s))  Comprehensive metabolic panel     Status: Abnormal   Collection Time: 12/08/21  3:45 PM  Result Value Ref Range   Sodium 139 135 - 145 mmol/L   Potassium 4.6 3.5 - 5.1 mmol/L   Chloride 107 98 - 111 mmol/L   CO2 26 22 - 32 mmol/L   Glucose, Bld 124 (H) 70 - 99 mg/dL    Comment: Glucose reference range applies only to samples taken after fasting for at least 8 hours.  BUN 19 8 - 23 mg/dL   Creatinine, Ser 7.61 (H) 0.61 - 1.24 mg/dL   Calcium 8.6 (L) 8.9 - 10.3 mg/dL   Total Protein 5.5 (L) 6.5 - 8.1 g/dL   Albumin 3.6 3.5 - 5.0 g/dL   AST 31 15 - 41 U/L   ALT 23 0 - 44 U/L   Alkaline Phosphatase 53 38 - 126 U/L   Total Bilirubin 0.5 0.3 - 1.2 mg/dL   GFR, Estimated 57 (L) >60 mL/min    Comment: (NOTE) Calculated using the CKD-EPI Creatinine Equation (2021)    Anion gap 6 5 - 15    Comment: Performed at [facility], [address], [place], [place] [address]  CBC with Differential     Status: None   Collection Time: 12/08/21  3:45 PM  Result Value Ref Range   WBC 4.3 4.0 - 10.5 K/uL   RBC 4.74 4.22 - 5.81 MIL/uL   Hemoglobin 14.8 13.0 - 17.0 g/dL   HCT 26.7 12.4 - 58.0 %   MCV 92.0 80.0 - 100.0 fL   MCH 31.2 26.0 - 34.0 pg   MCHC 33.9 30.0 - 36.0 g/dL   RDW 99.8 33.8 -  25.0 %   Platelets 209 150 - 400 K/uL   nRBC 0.0 0.0 - 0.2 %   Neutrophils Relative % 61 %   Neutro Abs 2.7 1.7 - 7.7 K/uL   Lymphocytes Relative 24 %   Lymphs Abs 1.0 0.7 - 4.0 K/uL   Monocytes Relative 11 %   Monocytes Absolute 0.5 0.1 - 1.0 K/uL   Eosinophils Relative 2 %   Eosinophils Absolute 0.1 0.0 - 0.5 K/uL   Basophils Relative 1 %   Basophils Absolute 0.0 0.0 - 0.1 K/uL   Immature Granulocytes 1 %   Abs Immature Granulocytes 0.02 0.00 - 0.07 K/uL    Comment: Performed at [facility], [address], [place], [place] [address]  Brain natriuretic peptide     Status: Abnormal   Collection Time: 12/08/21  3:45 PM  Result Value Ref Range   B Natriuretic Peptide 126.7 (H) 0.0 - 100.0 pg/mL    Comment: Performed at [facility], [address], [place], [place] [address]  Troponin I (High Sensitivity)     Status: None   Collection Time: 12/08/21  3:45 PM  Result Value Ref Range   Troponin I (High Sensitivity) 10 <18 ng/L    Comment: (NOTE) Elevated high sensitivity troponin I (hsTnI) values and significant  changes across serial measurements may suggest ACS but many other  chronic and acute conditions are known to elevate hsTnI results.  Refer to the "Links" section for chest pain algorithms and additional  guidance. Performed at [facility], [address], [place], [place] [address]   Troponin I (High Sensitivity)     Status: None   Collection Time: 12/08/21  9:06 PM  Result Value Ref Range   Troponin I (High Sensitivity) 10 <18 ng/L    Comment: (NOTE) Elevated high sensitivity troponin I (hsTnI) values and significant  changes across serial measurements may suggest ACS but many other  chronic and acute conditions are known to elevate hsTnI results.  Refer to the "Links" section for chest pain algorithms and additional  guidance. Performed at [facility], [address] [place], [place] [address]   Basic metabolic panel     Status:  Abnormal   Collection Time: 12/09/21  3:16 AM  Result Value Ref Range   Sodium 138 135 - 145 mmol/L   Potassium 4.1 3.5 - 5.1 mmol/L   Chloride 107 98 - 111 mmol/L   CO2 24 22 - 32 mmol/L   Glucose, Bld 118 (H) 70 - 99 mg/dL    Comment: Glucose reference range applies only to samples taken after fasting for at least 8 hours.   BUN 21 8 - 23 mg/dL   Creatinine, Ser 1.23 0.61 - 1.24 mg/dL   Calcium 8.6 (L) 8.9 - 10.3 mg/dL   GFR, Estimated >60 >60 mL/min    Comment: (NOTE) Calculated using the CKD-EPI Creatinine Equation (2021)    Anion gap 7 5 - 15    Comment: Performed at [facility] [address] [place], [place] [address]  Glucose, capillary     Status: Abnormal   Collection Time: 12/09/21  5:57 AM  Result Value Ref Range   Glucose-Capillary 105 (H) 70 - 99 mg/dL    Comment: Glucose reference range applies only to samples taken after fasting for at least 8 hours.  Basic metabolic panel     Status: Abnormal   Collection Time: 12/10/21  4:07 AM  Result Value Ref Range   Sodium 140 135 - 145 mmol/L   Potassium 3.7 3.5 - 5.1 mmol/L   Chloride 105 98 - 111 mmol/L   CO2 29 22 - 32 mmol/L   Glucose, Bld 94 70 - 99 mg/dL    Comment: Glucose reference range applies only to samples taken after fasting for at least 8 hours.   BUN 21 8 - 23 mg/dL   Creatinine, Ser 1.28 (H) 0.61 - 1.24 mg/dL   Calcium 8.6 (L) 8.9 - 10.3 mg/dL   GFR, Estimated 58 (L) >60 mL/min    Comment: (NOTE) Calculated using the CKD-EPI Creatinine Equation (2021)    Anion gap 6 5 - 15    Comment: Performed at [facility] [address], [place], [place] [address]  Magnesium     Status: None   Collection Time: 12/10/21  4:07 AM  Result Value Ref Range   Magnesium 2.0 1.7 - 2.4 mg/dL    Comment: Performed at [facility] [address], [place], [place] [address]   ECHOCARDIOGRAM COMPLETE  Result Date: 12/09/2021    ECHOCARDIOGRAM REPORT   Patient Name:   DEMETREUS SARVIS Broussard Date of Exam:  12/09/2021 Medical Rec #:  [id number]      Height:       73.5 in Accession #:    [id number]     Weight:       237.6 lb Date of Birth:  May 29, 1945      BSA:          2.327 m Patient Age:    20 years       BP:           101/55 mmHg Patient Gender: M              HR:           63 bpm. Exam Location:  Inpatient Procedure: 2D Echo, Color Doppler and Cardiac Doppler Indications:    R55 Syncope  History:        Patient has no prior history of Echocardiogram examinations.                 Risk Factors:Dyslipidemia.  Sonographer:    Johny Chess RDCS Referring Phys: [id number] DeBary  1.  Significant hypokinesis of the anteroseptal/inferoseptal/anterior wall from base to apex. Left ventricular ejection fraction, by estimation, is 40 to 45%. The left ventricle has mildly decreased function. There is mild left ventricular hypertrophy. Left ventricular diastolic parameters are consistent with Grade I diastolic dysfunction (impaired relaxation).  2. Right ventricular systolic function is mildly reduced. The right ventricular size is normal. Tricuspid regurgitation signal is inadequate for assessing PA pressure.  3. The mitral valve is grossly normal. Trivial mitral valve regurgitation.  4. There is moderate calcification of the aortic valve. Aortic valve regurgitation is mild. No aortic stenosis is present.  5. There is mild dilatation of the ascending aorta, measuring 39 mm. There is mild dilatation of the aortic root, measuring 39 mm.  6. IVC not well visualized. Comparison(s): No prior Echocardiogram. Conclusion(s)/Recommendation(s): Echo suggestive of likely old LAD infarct. FINDINGS  Left Ventricle: Significant hypokinesis of the anteroseptal/inferoseptal/anterior wall from base to apex. Left ventricular ejection fraction, by estimation, is 40 to 45%. The left ventricle has mildly decreased function. The left ventricular internal cavity size was normal in size. There is mild left ventricular hypertrophy. Left  ventricular diastolic parameters are consistent with Grade I diastolic dysfunction (impaired relaxation). Right Ventricle: The right ventricular size is normal. No increase in right ventricular wall thickness. Right ventricular systolic function is mildly reduced. Tricuspid regurgitation signal is inadequate for assessing PA pressure. Left Atrium: Left atrial size was normal in size. Right Atrium: Right atrial size was normal in size. Pericardium: There is no evidence of pericardial effusion. Mitral Valve: The mitral valve is grossly normal. Trivial mitral valve regurgitation. Tricuspid Valve: The tricuspid valve is normal in structure. Tricuspid valve regurgitation is not demonstrated. Aortic Valve: There is moderate calcification of the aortic valve. Aortic valve regurgitation is mild. Aortic regurgitation PHT measures 719 msec. No aortic stenosis is present. Pulmonic Valve: The pulmonic valve was normal in structure. Pulmonic valve regurgitation is trivial. Aorta: There is mild dilatation of the ascending aorta, measuring 39 mm. There is mild dilatation of the aortic root, measuring 39 mm. Venous: IVC not well visualized. IAS/Shunts: The interatrial septum was not well visualized.  LEFT VENTRICLE PLAX 2D LVIDd:         5.50 cm      Diastology LVIDs:         4.40 cm      LV e' medial:    4.79 cm/s LV PW:         1.10 cm      LV E/e' medial:  10.4 LV IVS:        1.10 cm      LV e' lateral:   6.31 cm/s LVOT diam:     2.10 cm      LV E/e' lateral: 7.9 LV SV:         74 LV SV Index:   32 LVOT Area:     3.46 cm  LV Volumes (MOD) LV vol d, MOD A2C: 143.0 ml LV vol d, MOD A4C: 132.0 ml LV vol s, MOD A2C: 74.6 ml LV vol s, MOD A4C: 81.9 ml LV SV MOD A2C:     68.4 ml LV SV MOD A4C:     132.0 ml LV SV MOD BP:      62.4 ml RIGHT VENTRICLE RV Basal diam:  2.50 cm RV S prime:     8.27 cm/s TAPSE (M-mode): 1.1 cm LEFT ATRIUM             Index  RIGHT ATRIUM           Index LA diam:        3.50 cm 1.50 cm/m   RA Area:      15.50 cm LA Vol (A2C):   53.3 ml 22.91 ml/m  RA Volume:   42.00 ml  18.05 ml/m LA Vol (A4C):   53.5 ml 22.99 ml/m LA Biplane Vol: 54.9 ml 23.59 ml/m  AORTIC VALVE LVOT Vmax:   94.60 cm/s LVOT Vmean:  59.000 cm/s LVOT VTI:    0.213 m AI PHT:      719 msec  AORTA Ao Root diam: 3.90 cm Ao Asc diam:  3.90 cm MITRAL VALVE MV Area (PHT): 2.32 cm    SHUNTS MV Decel Time: 327 msec    Systemic VTI:  0.21 m MV E velocity: 49.90 cm/s  Systemic Diam: 2.10 cm MV A velocity: 85.20 cm/s MV E/A ratio:  0.59 Mary Scientist, physiological signed by Phineas Inches Signature Date/Time: 12/09/2021/12:43:28 PM    Final    DG Chest 1 View  Result Date: 12/08/2021 CLINICAL DATA:  chest pain EXAM: CHEST  1 VIEW COMPARISON:  November 04, 2019 FINDINGS: The heart size and mediastinal contours are within normal limits. Stable ectatic thoracic aorta. Both lungs are clear and stable. The visualized skeletal structures are unremarkable. IMPRESSION: No active disease. Electronically Signed   By: Frazier Richards M.D.   On: 12/08/2021 17:03    Review Of Systems Constitutional: No fever, chills, weight loss or gain. Eyes: No vision change, wears glasses. No discharge or pain. Ears: No hearing loss, No tinnitus. Respiratory: No asthma, COPD, pneumonias. No shortness of breath. No hemoptysis. Cardiovascular: No chest pain, palpitation, leg edema. Gastrointestinal: No nausea, vomiting, diarrhea, constipation. No GI bleed. No hepatitis. Genitourinary: No dysuria, hematuria, kidney stone. No incontinance. Neurological: No headache, stroke, seizures.  Psychiatry: No psych facility admission for anxiety, depression, suicide. No detox. Skin: No rash. Musculoskeletal: Positive joint pain, no fibromyalgia. No neck pain, back pain. Lymphadenopathy: No lymphadenopathy. Hematology: No anemia or easy bruising.   Blood pressure 110/67, pulse 70, temperature (!) 97.4 F (36.3 C), temperature source Oral, resp. rate 18, height 6' 1.5" (1.867 m),  weight 107.8 kg, SpO2 98 %. Body mass index is 30.92 kg/m. General appearance: alert, cooperative, appears stated age and no distress Head: Normocephalic, atraumatic. Eyes: Blue eyes, pink conjunctiva, corneas clear.  Neck: No adenopathy, no carotid bruit, no JVD, supple, symmetrical, trachea midline and thyroid not enlarged. Resp: Clear to auscultation bilaterally. Cardio: Regular rate and rhythm, S1, S2 normal, II/VI systolic murmur, no click, rub or gallop GI: Soft, non-tender; bowel sounds normal; no organomegaly. Extremities: No edema, cyanosis or clubbing. Skin: Warm and dry.  Neurologic: Alert and oriented X 2, normal strength. Normal coordination and slow gait.  Assessment/Plan Syncope Possible ischemic cardiomyopathy Rheumatoid arthritis Dementia AKI  Plan: Patient and wife prefer stress test over cardiac catheterization. NPO post mid-night.  Time spent: Review of old records, Lab, x-rays, EKG, other cardiac tests, examination, discussion with patient over 70 minutes.  Birdie Riddle, MD  12/10/2021, 2:39 PM

## 2021-12-10 NOTE — Evaluation (Signed)
Physical Therapy Evaluation Patient Details Name: Troy [name] MRN: [id number] DOB: 05/06/1946 Today's Date: 12/10/2021  History of Present Illness  Pt is a 76 y/o male presenting on 7/29 with syncope. EKG with L bundle branch block but not a new finding. PMH includes: dementia, RA, back surgery, L rotator cuff repair.  Clinical Impression  Pt presents to PT at or very close to baseline with mobility. Ready for dc home from PT standpoint. Spoke to daughter about future PT needs if his mobility/balance begins to decline. She verbalized understanding that they can seek OPPT from primary care in the future if needed.        Recommendations for follow up therapy are one component of a multi-disciplinary discharge planning process, led by the attending physician.  Recommendations may be updated based on patient status, additional functional criteria and insurance authorization.  Follow Up Recommendations No PT follow up      Assistance Recommended at Discharge Frequent or constant Supervision/Assistance (for dementia)  Patient can return home with the following  Direct supervision/assist for financial management;Direct supervision/assist for medications management;Assist for transportation;Help with stairs or ramp for entrance    Equipment Recommendations None recommended by PT  Recommendations for Other Services       Functional Status Assessment Patient has not had a recent decline in their functional status     Precautions / Restrictions Precautions Precautions: Fall Restrictions Weight Bearing Restrictions: No      Mobility  Bed Mobility Overal bed mobility: Independent                  Transfers Overall transfer level: Needs assistance Equipment used: None Transfers: Sit to/from Stand Sit to Stand: Supervision           General transfer comment: for safety    Ambulation/Gait Ambulation/Gait assistance: Supervision Gait Distance (Feet): 250  Feet Assistive device: None Gait Pattern/deviations: Step-through pattern, Decreased step length - right, Decreased step length - left, Shuffle, Knee flexed in stance - right, Knee flexed in stance - left, Trunk flexed Gait velocity: decr Gait velocity interpretation: 1.31 - 2.62 ft/sec, indicative of limited community ambulator   General Gait Details: supervision for safety. Pt with flexed shuffling posture.  Stairs Stairs: Yes Stairs assistance: Min guard Stair Management: One rail Left, Step to pattern, Forwards Number of Stairs: 4    Wheelchair Mobility    Modified Rankin (Stroke Patients Only)       Balance Overall balance assessment: Mild deficits observed, not formally tested                                           Pertinent Vitals/Pain Pain Assessment Pain Assessment: No/denies pain    Home Living Family/patient expects to be discharged to:: Private residence Living Arrangements: Spouse/significant other Available Help at Discharge: Family;Available 24 hours/day Type of Home: House Home Access: Ramped entrance       Home Layout: One level Home Equipment: Grab bars - tub/shower;Grab bars - toilet;Shower Counsellor (2 wheels);BSC/3in1 Additional Comments: pt sleeps on the couch    Prior Function Prior Level of Function : Needs assist  Cognitive Assist : ADLs (cognitive)   ADLs (Cognitive): Set up cues       Mobility Comments: independently ADLs Comments: supervision for ADLs (requires cueing to complete tasks, but is able to complete), spouse assists with IADLs     Hand  Dominance        Extremity/Trunk Assessment   Upper Extremity Assessment Upper Extremity Assessment: Defer to OT evaluation RUE Deficits / Details: limited shoulder movement due to rotator cuff tear, baseline    Lower Extremity Assessment Lower Extremity Assessment: Generalized weakness       Communication   Communication: No difficulties   Cognition Arousal/Alertness: Awake/alert Behavior During Therapy: WFL for tasks assessed/performed Overall Cognitive Status: History of cognitive impairments - at baseline                                 General Comments: hx of dementia, follow simple commands        General Comments General comments (skin integrity, edema, etc.): daughter presents    Exercises     Assessment/Plan    PT Assessment Patient does not need any further PT services  PT Problem List         PT Treatment Interventions      PT Goals (Current goals can be found in the Care Plan section)  Acute Rehab PT Goals PT Goal Formulation: All assessment and education complete, DC therapy    Frequency       Co-evaluation               AM-PAC PT "6 Clicks" Mobility  Outcome Measure Help needed turning from your back to your side while in a flat bed without using bedrails?: None Help needed moving from lying on your back to sitting on the side of a flat bed without using bedrails?: None Help needed moving to and from a bed to a chair (including a wheelchair)?: A Little Help needed standing up from a chair using your arms (e.g., wheelchair or bedside chair)?: A Little Help needed to walk in hospital room?: A Little Help needed climbing 3-5 steps with a railing? : A Little 6 Click Score: 20    End of Session   Activity Tolerance: Patient tolerated treatment well Patient left: in chair;with call bell/phone within reach;with family/visitor present   PT Visit Diagnosis: Muscle weakness (generalized) (M62.81)    Time: 2025-4270 PT Time Calculation (min) (ACUTE ONLY): 12 min   Charges:   PT Evaluation $PT Eval Low Complexity: 1 Low          [facility] PT Acute Rehabilitation Services Office [phone]   Angelina Ok [facility] 12/10/2021, 1:00 PM

## 2021-12-10 NOTE — Care Management (Signed)
  Transition of Care [facility]) Screening Note   Patient Details  Name: SHARIQ PUIG Date of Birth: 03/25/1946   Transition of Care [facility]) CM/SW Contact:    Gala Lewandowsky, RN Phone Number: 12/10/2021, 3:36 PM    Transition of Care Department [facility]) has reviewed the patient and no TOC needs have been identified at this time. Case Manager did call the [facility] Fees Coordinator to make them aware of hospitalization. Case Manager will continue to follow for additional transition of care needs.

## 2021-12-10 NOTE — Progress Notes (Signed)
PROGRESS NOTE  Troy [name]  PNT:[id number] DOB: 13-Feb-1946 DOA: 12/08/2021 PCP: Judy Pimple, MD   Brief Narrative:  Patient is a 76 year old male with history of dementia, rheumatoid arthritis on methotrexate, hyperlipidemia who presented with syncope.  As per the report, patient was sitting on the bed of his truck outside in the shade watching a friend fix his mower.  He later felt dizzy and had witnessed loss of consciousness by his friend.  Wife then came out and saw him vomiting with mucus coming of his nose.  On condition he was bradycardic to 50s, normotensive.  Labs showed mild AKI with creatinine of 1.2.  Chest x-ray did not show any pneumonia or other findings.  EKG showed left bundle branch block which is not a new finding.  Patient admitted for syncopal management.  Echo showed diminished left ventricular function, planning for nuclear stress test.  Assessment & Plan:  Principal Problem:   Syncope Active Problems:   Rheumatoid arthritis (HCC)   Dementia (HCC)   AKI (acute kidney injury) (HCC)   Systolic CHF (HCC)   Syncope: Events as above.  Currently he is hemodynamically stable.  Monitor on telemetry.  EKG shows left bundle branch block which is not a new finding.  Echo as below.  Orthostatic vitals were found to be negative .PT/OT evaluation done, no follow-up recommended  Systolic congestive heart failure: New onset.  Currently euvolemic.  No clinical signs of heart failure.  Echo done here showed EF of 40 to 45%, anterior wall hypokinesia.  Troponin is still normal.  No chest pain.He has mild elevated BNP.  Cardiology consulted, plan for nuclear stress test.  AKI: Resolved with IV fluids  Dementia: Alert and oriented to place but not to time.  Continue supportive care.  Delirium precautions.  Continue Aricept, Namenda  Rheumatoid arthritis: On methotrexate.  Also on clinical trial with baricitinib        DVT prophylaxis:enoxaparin (LOVENOX) injection 40 mg  Start: 12/08/21 1945     Code Status: Full Code  Family Communication: wife at bedside  Patient status:Obs  Patient is from :Home  Anticipated discharge GQ:QPYP  Estimated DC date:tomorrow   Consultants: None  Procedures:None  Antimicrobials:  Anti-infectives (From admission, onward)    None       Subjective:  Patient seen and examined at the bedside this morning.  Hemodynamically stable.  Lying comfortably in bed.  Alert and awake.  Not in any Distress.  Denies any chest pain  Objective: Vitals:   12/09/21 1738 12/09/21 2115 12/10/21 0802 12/10/21 1337  BP: 107/70 121/68 125/76 110/67  Pulse: 65  60 70  Resp: 19 18 19 18   Temp: 98.1 F (36.7 C) 98.5 F (36.9 C) 98 F (36.7 C) (!) 97.4 F (36.3 C)  TempSrc: Oral Oral Oral Oral  SpO2: 96% 97% 99% 98%  Weight:      Height:        Intake/Output Summary (Last 24 hours) at 12/10/2021 1520 Last data filed at 12/10/2021 0959 Gross per 24 hour  Intake 3 ml  Output --  Net 3 ml   Filed Weights   12/08/21 1735 12/08/21 2120 12/09/21 0647  Weight: 111.3 kg 111.5 kg 107.8 kg    Examination:  General exam: Overall comfortable, not in distress, pleasantly confused HEENT: PERRL Respiratory system:  no wheezes or crackles  Cardiovascular system: S1 & S2 heard, RRR.  Gastrointestinal system: Abdomen is nondistended, soft and nontender. Central nervous system: Alert and awake, not  oriented Extremities: No edema, no clubbing ,no cyanosis Skin: No rashes, no ulcers,no icterus     Data Reviewed: I have personally reviewed following labs and imaging studies  CBC: Recent Labs  Lab 12/08/21 1545  WBC 4.3  NEUTROABS 2.7  HGB 14.8  HCT 43.6  MCV 92.0  PLT 209   Basic Metabolic Panel: Recent Labs  Lab 12/08/21 1545 12/09/21 0316 12/10/21 0407  NA 139 138 140  K 4.6 4.1 3.7  CL 107 107 105  CO2 26 24 29   GLUCOSE 124* 118* 94  BUN 19 21 21   CREATININE 1.29* 1.23 1.28*  CALCIUM 8.6* 8.6* 8.6*  MG   --   --  2.0     No results found for this or any previous visit (from the past 240 hour(s)).   Radiology Studies: ECHOCARDIOGRAM COMPLETE  Result Date: 12/09/2021    ECHOCARDIOGRAM REPORT   Patient Name:   Troy [name] Troy [name] Date of Exam: 12/09/2021 Medical Rec #:  Troy [name]      Height:       73.5 in Accession #:    12/11/2021     Weight:       237.6 lb Date of Birth:  [birth year]      BSA:          2.327 m Patient Age:    76 years       BP:           101/55 mmHg Patient Gender: M              HR:           63 bpm. Exam Location:  Inpatient Procedure: 2D Echo, Color Doppler and Cardiac Doppler Indications:    R55 Syncope  History:        Patient has no prior history of Echocardiogram examinations.                 Risk Factors:Dyslipidemia.  Sonographer:    10/09/1945 RDCS Referring Phys: 06-21-1988 Troy [name] IMPRESSIONS  1. Significant hypokinesis of the anteroseptal/inferoseptal/anterior wall from base to apex. Left ventricular ejection fraction, by estimation, is 40 to 45%. The left ventricle has mildly decreased function. There is mild left ventricular hypertrophy. Left ventricular diastolic parameters are consistent with Grade I diastolic dysfunction (impaired relaxation).  2. Right ventricular systolic function is mildly reduced. The right ventricular size is normal. Tricuspid regurgitation signal is inadequate for assessing PA pressure.  3. The mitral valve is grossly normal. Trivial mitral valve regurgitation.  4. There is moderate calcification of the aortic valve. Aortic valve regurgitation is mild. No aortic stenosis is present.  5. There is mild dilatation of the ascending aorta, measuring 39 mm. There is mild dilatation of the aortic root, measuring 39 mm.  6. IVC not well visualized. Comparison(s): No prior Echocardiogram. Conclusion(s)/Recommendation(s): Echo suggestive of likely old LAD infarct. FINDINGS  Left Ventricle: Significant hypokinesis of the anteroseptal/inferoseptal/anterior wall  from base to apex. Left ventricular ejection fraction, by estimation, is 40 to 45%. The left ventricle has mildly decreased function. The left ventricular internal cavity size was normal in size. There is mild left ventricular hypertrophy. Left ventricular diastolic parameters are consistent with Grade I diastolic dysfunction (impaired relaxation). Right Ventricle: The right ventricular size is normal. No increase in right ventricular wall thickness. Right ventricular systolic function is mildly reduced. Tricuspid regurgitation signal is inadequate for assessing PA pressure. Left Atrium: Left atrial size was normal in size. Right Atrium: Right atrial  size was normal in size. Pericardium: There is no evidence of pericardial effusion. Mitral Valve: The mitral valve is grossly normal. Trivial mitral valve regurgitation. Tricuspid Valve: The tricuspid valve is normal in structure. Tricuspid valve regurgitation is not demonstrated. Aortic Valve: There is moderate calcification of the aortic valve. Aortic valve regurgitation is mild. Aortic regurgitation PHT measures 719 msec. No aortic stenosis is present. Pulmonic Valve: The pulmonic valve was normal in structure. Pulmonic valve regurgitation is trivial. Aorta: There is mild dilatation of the ascending aorta, measuring 39 mm. There is mild dilatation of the aortic root, measuring 39 mm. Venous: IVC not well visualized. IAS/Shunts: The interatrial septum was not well visualized.  LEFT VENTRICLE PLAX 2D LVIDd:         5.50 cm      Diastology LVIDs:         4.40 cm      LV e' medial:    4.79 cm/s LV PW:         1.10 cm      LV E/e' medial:  10.4 LV IVS:        1.10 cm      LV e' lateral:   6.31 cm/s LVOT diam:     2.10 cm      LV E/e' lateral: 7.9 LV SV:         74 LV SV Index:   32 LVOT Area:     3.46 cm  LV Volumes (MOD) LV vol d, MOD A2C: 143.0 ml LV vol d, MOD A4C: 132.0 ml LV vol s, MOD A2C: 74.6 ml LV vol s, MOD A4C: 81.9 ml LV SV MOD A2C:     68.4 ml LV SV MOD A4C:      132.0 ml LV SV MOD BP:      62.4 ml RIGHT VENTRICLE RV Basal diam:  2.50 cm RV S prime:     8.27 cm/s TAPSE (M-mode): 1.1 cm LEFT ATRIUM             Index        RIGHT ATRIUM           Index LA diam:        3.50 cm 1.50 cm/m   RA Area:     15.50 cm LA Vol (A2C):   53.3 ml 22.91 ml/m  RA Volume:   42.00 ml  18.05 ml/m LA Vol (A4C):   53.5 ml 22.99 ml/m LA Biplane Vol: 54.9 ml 23.59 ml/m  AORTIC VALVE LVOT Vmax:   94.60 cm/s LVOT Vmean:  59.000 cm/s LVOT VTI:    0.213 m AI PHT:      719 msec  AORTA Ao Root diam: 3.90 cm Ao Asc diam:  3.90 cm MITRAL VALVE MV Area (PHT): 2.32 cm    SHUNTS MV Decel Time: 327 msec    Systemic VTI:  0.21 m MV E velocity: 49.90 cm/s  Systemic Diam: 2.10 cm MV A velocity: 85.20 cm/s MV E/A ratio:  0.59 Mary Scientist, physiological signed by Phineas Inches Signature Date/Time: 12/09/2021/12:43:28 PM    Final    DG Chest 1 View  Result Date: 12/08/2021 CLINICAL DATA:  chest pain EXAM: CHEST  1 VIEW COMPARISON:  November 04, 2019 FINDINGS: The heart size and mediastinal contours are within normal limits. Stable ectatic thoracic aorta. Both lungs are clear and stable. The visualized skeletal structures are unremarkable. IMPRESSION: No active disease. Electronically Signed   By: Frazier Richards M.D.   On: 12/08/2021 17:03  Scheduled Meds:  baricitinib  2 mg Oral Daily   donepezil  5 mg Oral QHS   enoxaparin (LOVENOX) injection  40 mg Subcutaneous Q24H   folic acid  1 mg Oral Daily   memantine  10 mg Oral BID   mirabegron ER  25 mg Oral Daily   sodium chloride flush  3 mL Intravenous Q12H   Continuous Infusions:   LOS: 0 days   Burnadette Pop, MD [facility] P7/31/2023, 3:20 PM

## 2021-12-11 ENCOUNTER — Observation Stay (HOSPITAL_COMMUNITY): Payer: No Typology Code available for payment source

## 2021-12-11 DIAGNOSIS — Z955 Presence of coronary angioplasty implant and graft: Secondary | ICD-10-CM | POA: Diagnosis not present

## 2021-12-11 DIAGNOSIS — I447 Left bundle-branch block, unspecified: Secondary | ICD-10-CM | POA: Diagnosis present

## 2021-12-11 DIAGNOSIS — R55 Syncope and collapse: Secondary | ICD-10-CM | POA: Diagnosis present

## 2021-12-11 DIAGNOSIS — E785 Hyperlipidemia, unspecified: Secondary | ICD-10-CM | POA: Diagnosis present

## 2021-12-11 DIAGNOSIS — Z9861 Coronary angioplasty status: Secondary | ICD-10-CM | POA: Diagnosis not present

## 2021-12-11 DIAGNOSIS — N179 Acute kidney failure, unspecified: Secondary | ICD-10-CM | POA: Diagnosis present

## 2021-12-11 DIAGNOSIS — R072 Precordial pain: Secondary | ICD-10-CM | POA: Diagnosis not present

## 2021-12-11 DIAGNOSIS — I25110 Atherosclerotic heart disease of native coronary artery with unstable angina pectoris: Secondary | ICD-10-CM | POA: Diagnosis not present

## 2021-12-11 DIAGNOSIS — R001 Bradycardia, unspecified: Secondary | ICD-10-CM | POA: Diagnosis present

## 2021-12-11 DIAGNOSIS — Z79631 Long term (current) use of antimetabolite agent: Secondary | ICD-10-CM | POA: Diagnosis not present

## 2021-12-11 DIAGNOSIS — Z006 Encounter for examination for normal comparison and control in clinical research program: Secondary | ICD-10-CM | POA: Diagnosis not present

## 2021-12-11 DIAGNOSIS — Z6831 Body mass index (BMI) 31.0-31.9, adult: Secondary | ICD-10-CM | POA: Diagnosis not present

## 2021-12-11 DIAGNOSIS — Z888 Allergy status to other drugs, medicaments and biological substances status: Secondary | ICD-10-CM | POA: Diagnosis not present

## 2021-12-11 DIAGNOSIS — Z85038 Personal history of other malignant neoplasm of large intestine: Secondary | ICD-10-CM | POA: Diagnosis not present

## 2021-12-11 DIAGNOSIS — M069 Rheumatoid arthritis, unspecified: Secondary | ICD-10-CM | POA: Diagnosis present

## 2021-12-11 DIAGNOSIS — Z800 Family history of malignant neoplasm of digestive organs: Secondary | ICD-10-CM | POA: Diagnosis not present

## 2021-12-11 DIAGNOSIS — F0390 Unspecified dementia without behavioral disturbance: Secondary | ICD-10-CM | POA: Diagnosis present

## 2021-12-11 DIAGNOSIS — I255 Ischemic cardiomyopathy: Secondary | ICD-10-CM | POA: Diagnosis present

## 2021-12-11 DIAGNOSIS — R1110 Vomiting, unspecified: Secondary | ICD-10-CM | POA: Diagnosis present

## 2021-12-11 DIAGNOSIS — I2510 Atherosclerotic heart disease of native coronary artery without angina pectoris: Secondary | ICD-10-CM | POA: Diagnosis not present

## 2021-12-11 DIAGNOSIS — I517 Cardiomegaly: Secondary | ICD-10-CM | POA: Diagnosis not present

## 2021-12-11 DIAGNOSIS — E669 Obesity, unspecified: Secondary | ICD-10-CM | POA: Diagnosis present

## 2021-12-11 DIAGNOSIS — Z79899 Other long term (current) drug therapy: Secondary | ICD-10-CM | POA: Diagnosis not present

## 2021-12-11 DIAGNOSIS — N182 Chronic kidney disease, stage 2 (mild): Secondary | ICD-10-CM | POA: Diagnosis not present

## 2021-12-11 LAB — GLUCOSE, CAPILLARY: Glucose-Capillary: 106 mg/dL — ABNORMAL HIGH (ref 70–99)

## 2021-12-11 MED ORDER — SODIUM CHLORIDE 0.9% FLUSH: 3.0000 mL | INTRAVENOUS | Status: DC | PRN

## 2021-12-11 MED ORDER — REGADENOSON 0.4 MG/5ML IV SOLN
0.4000 mg | Freq: Once | INTRAVENOUS | Status: AC
  Filled 2021-12-11: qty 5

## 2021-12-11 MED ORDER — TECHNETIUM TC 99M MEBROFENIN IV KIT: 5.2000 | PACK | Freq: Once | INTRAVENOUS | Status: DC | PRN

## 2021-12-11 MED ORDER — TECHNETIUM TC 99M TETROFOSMIN IV KIT
31.7000 | PACK | Freq: Once | INTRAVENOUS | Status: AC | PRN
  Administered 2021-12-11: 31.7 via INTRAVENOUS

## 2021-12-11 MED ORDER — SODIUM CHLORIDE 0.9 % IV SOLN: INTRAVENOUS | Status: DC

## 2021-12-11 MED ORDER — TECHNETIUM TC 99M TETROFOSMIN IV KIT
10.3000 | PACK | Freq: Once | INTRAVENOUS | Status: AC | PRN
  Administered 2021-12-11: 10.3 via INTRAVENOUS

## 2021-12-11 MED ORDER — SODIUM CHLORIDE 0.9% FLUSH
3.0000 mL | Freq: Two times a day (BID) | INTRAVENOUS | Status: DC
Start: 2021-12-11 — End: 2021-12-13
  Administered 2021-12-11 – 2021-12-12 (×2): 3 mL via INTRAVENOUS

## 2021-12-11 MED ORDER — ASPIRIN 81 MG PO CHEW
81.0000 mg | CHEWABLE_TABLET | ORAL | Status: AC
  Administered 2021-12-12: 81 mg via ORAL
  Filled 2021-12-11: qty 1

## 2021-12-11 MED ORDER — REGADENOSON 0.4 MG/5ML IV SOLN
INTRAVENOUS | Status: AC
  Administered 2021-12-11: 0.4 mg via INTRAVENOUS
  Filled 2021-12-11: qty 5

## 2021-12-11 MED ORDER — SODIUM CHLORIDE 0.9 % IV SOLN: 250.0000 mL | INTRAVENOUS | Status: DC | PRN

## 2021-12-11 NOTE — Progress Notes (Signed)
Dr. Algie Coffer notified of stress test results.

## 2021-12-11 NOTE — Progress Notes (Signed)
PROGRESS NOTE  JAMARQUIS CRULL  BJS:[id number] DOB: 05-Jul-1945 DOA: 12/08/2021 PCP: Judy Pimple, MD   Brief Narrative:  Patient is a 76 year old male with history of dementia, rheumatoid arthritis on methotrexate, hyperlipidemia who presented with syncope.  As per the report, patient was sitting on the bed of his truck outside in the shade watching a friend fix his mower.  He later felt dizzy and had witnessed loss of consciousness by his friend.  Wife then came out and saw him vomiting with mucus coming of his nose.  On condition he was bradycardic to 50s, normotensive.  Labs showed mild AKI with creatinine of 1.2.  Chest x-ray did not show any pneumonia or other findings.  EKG showed left bundle branch block which is not a new finding.  Patient admitted for syncopal management.  Echo showed diminished left ventricular function, positive nuclear stress test.Plan for cardiac cath tomorrow.  Assessment & Plan:  Principal Problem:   Syncope Active Problems:   Rheumatoid arthritis (HCC)   Dementia (HCC)   AKI (acute kidney injury) (HCC)   Systolic CHF (HCC)   Syncope: Events as above.  Currently he is hemodynamically stable.  Monitor on telemetry.  EKG shows left bundle branch block which is not a new finding.  Echo as below.  Orthostatic vitals were found to be negative .PT/OT evaluation done, no follow-up recommended  Systolic congestive heart failure: New onset.  Currently euvolemic.  No clinical signs of heart failure.  Echo done here showed EF of 40 to 45%, anterior wall hypokinesia.  Troponin is still normal.  No chest pain.He has mildly elevated BNP.  Cardiology consulted.Underwent nuclear stress test which was positive for reversible ischemia.Plan for cardiac cath in AM  AKI: Resolved with IV fluids  Dementia: Alert and oriented to place but not to time.  Continue supportive care.  Delirium precautions.  Continue Aricept, Namenda  Rheumatoid arthritis: On methotrexate.  Also on  clinical trial with baricitinib        DVT prophylaxis:enoxaparin (LOVENOX) injection 40 mg Start: 12/08/21 1945     Code Status: Full Code  Family Communication: wife on phone on 8/1  Patient status:Inpatient  Patient is from :Home  Anticipated discharge YW:VPXT  Estimated DC date:after full ischemic work up   Consultants: None  Procedures:None  Antimicrobials:  Anti-infectives (From admission, onward)    None       Subjective: Patient was in nuclear stress test. Reviewed nuclear stress test results,discussed with cardiology,plan for cardiac cath. Called and discussed with wife about the plan.Patient remains hemodynamically stable,chest pain free.  Objective: Vitals:   12/11/21 1008 12/11/21 1010 12/11/21 1129 12/11/21 1504  BP: 123/70 120/65 131/76 120/74  Pulse: 82  66 60  Resp:   18 18  Temp:   (!) 97.5 F (36.4 C) 97.6 F (36.4 C)  TempSrc:   Oral Oral  SpO2:   99% 99%  Weight:      Height:        Intake/Output Summary (Last 24 hours) at 12/11/2021 1719 Last data filed at 12/11/2021 1617 Gross per 24 hour  Intake 292.33 ml  Output --  Net 292.33 ml   Filed Weights   12/08/21 2120 12/09/21 0647 12/11/21 0541  Weight: 111.5 kg 107.8 kg 109.2 kg    Examination:  Patient was in nuclear stress test   Data Reviewed: I have personally reviewed following labs and imaging studies  CBC: Recent Labs  Lab 12/08/21 1545  WBC 4.3  NEUTROABS 2.7  HGB 14.8  HCT 43.6  MCV 92.0  PLT 209   Basic Metabolic Panel: Recent Labs  Lab 12/08/21 1545 12/09/21 0316 12/10/21 0407  NA 139 138 140  K 4.6 4.1 3.7  CL 107 107 105  CO2 26 24 29   GLUCOSE 124* 118* 94  BUN 19 21 21   CREATININE 1.29* 1.23 1.28*  CALCIUM 8.6* 8.6* 8.6*  MG  --   --  2.0     No results found for this or any previous visit (from the past 240 hour(s)).   Radiology Studies: NM Myocar Multi W/Spect W/Wall Motion / EF  Result Date: 12/11/2021 CLINICAL DATA:  High risk  coronary artery disease. 76 year old male. EXAM: MYOCARDIAL IMAGING WITH SPECT (REST AND PHARMACOLOGIC-STRESS) GATED LEFT VENTRICULAR WALL MOTION STUDY LEFT VENTRICULAR EJECTION FRACTION TECHNIQUE: Standard myocardial SPECT imaging was performed after resting intravenous injection of 10 mCi Tc-72m tetrofosmin. Subsequently, intravenous infusion of Lexiscan was performed under the supervision of the Cardiology staff. At peak effect of the drug, 30 mCi Tc-27m tetrofosmin was injected intravenously and standard myocardial SPECT imaging was performed. Quantitative gated imaging was also performed to evaluate left ventricular wall motion, and estimate left ventricular ejection fraction. COMPARISON:  None Available. FINDINGS: Perfusion: Small focus moderate decreased counts within the apical segment the anterior septal wall which improves from stress to rest. Larger more severe defect involving the apical, mid and basilar segment of the inferior and inferolateral wall which is fixed on rest and stress. Wall Motion: No focal wall motion abnormality. LEFT ventricular dilatation Left Ventricular Ejection Fraction: 42 % End diastolic volume 183 ml End systolic volume 106 ml IMPRESSION: 1. Small focus of reversible ischemia in the apical segment anterior septal wall. 2. Larger fixed defect in the inferolateral wall consistent with scar. 3. Normal left ventricular wall motion.  LEFT ventricular dilatation 4. Left ventricular ejection fraction 42% 5. Non invasive risk stratification*: Intermediate *2012 Appropriate Use Criteria for Coronary Revascularization Focused Update: J Am Coll Cardiol. 2012;59(9):857-881. http://content.84m.aspx?articleid=[email or web address] These results will be called to the ordering clinician or representative by the Radiologist Assistant, and communication documented in the PACS or 12-21-1981. Electronically Signed   By: [email or web address] M.D.   On: 12/11/2021 11:36    Scheduled Meds:   [START ON 12/12/2021] aspirin  81 mg Oral Pre-Cath   baricitinib  2 mg Oral Daily   donepezil  5 mg Oral QHS   enoxaparin (LOVENOX) injection  40 mg Subcutaneous Q24H   folic acid  1 mg Oral Daily   memantine  10 mg Oral BID   mirabegron ER  25 mg Oral Daily   sodium chloride flush  3 mL Intravenous Q12H   sodium chloride flush  3 mL Intravenous Q12H   Continuous Infusions:  sodium chloride     sodium chloride 50 mL/hr at 12/11/21 1617     LOS: 0 days   02/11/2022, MD [facility] P8/05/2021, 5:19 PM

## 2021-12-11 NOTE — Progress Notes (Signed)
Returned from stress test at 1130hrs.

## 2021-12-11 NOTE — Progress Notes (Signed)
Patient taken for stress test at 0800hrs.

## 2021-12-11 NOTE — H&P (View-Only) (Signed)
Ref: Abner Greenspan, MD   Subjective:  Awake.  Patient and wife aware of abnormal stress test result.. Both are interested in cardiac cath for further evaluation and treatment.  Objective:  Vital Signs in the last 24 hours: Temp:  [97.4 F (36.3 C)-98.3 F (36.8 C)] 97.6 F (36.4 C) (08/01 1504) Pulse Rate:  [51-82] 60 (08/01 1504) Cardiac Rhythm: Sinus bradycardia;Normal sinus rhythm;Bundle branch block (08/01 1130) Resp:  [18] 18 (08/01 1504) BP: (111-131)/(65-80) 120/74 (08/01 1504) SpO2:  [98 %-99 %] 99 % (08/01 1504) Weight:  [109.2 kg] 109.2 kg (08/01 0541)  Physical Exam: BP Readings from Last 1 Encounters:  12/11/21 120/74     Wt Readings from Last 1 Encounters:  12/11/21 109.2 kg    Weight change:  Body mass index is 31.33 kg/m. HEENT: [place]/AT, Eyes-Blue, Conjunctiva-Pink, Sclera-Non-icteric Neck: No JVD, No bruit, Trachea midline. Lungs:  Clear, Bilateral. Cardiac:  Regular rhythm, normal S1 and S2, no S3. II/VI systolic murmur. Abdomen:  Soft, non-tender. BS present. Extremities:  No edema present. No cyanosis. No clubbing. CNS: AxOx2, Cranial nerves grossly intact, moves all 4 extremities.  Skin: Warm and dry.   Intake/Output from previous day: 07/31 0701 - 08/01 0700 In: 3 [I.V.:3] Out: -     Lab Results: BMET    Component Value Date/Time   NA 140 12/10/2021 0407   NA 138 12/09/2021 0316   NA 139 12/08/2021 1545   K 3.7 12/10/2021 0407   K 4.1 12/09/2021 0316   K 4.6 12/08/2021 1545   CL 105 12/10/2021 0407   CL 107 12/09/2021 0316   CL 107 12/08/2021 1545   CO2 29 12/10/2021 0407   CO2 24 12/09/2021 0316   CO2 26 12/08/2021 1545   GLUCOSE 94 12/10/2021 0407   GLUCOSE 118 (H) 12/09/2021 0316   GLUCOSE 124 (H) 12/08/2021 1545   BUN 21 12/10/2021 0407   BUN 21 12/09/2021 0316   BUN 19 12/08/2021 1545   CREATININE 1.28 (H) 12/10/2021 0407   CREATININE 1.23 12/09/2021 0316   CREATININE 1.29 (H) 12/08/2021 1545   CALCIUM 8.6 (L) 12/10/2021  0407   CALCIUM 8.6 (L) 12/09/2021 0316   CALCIUM 8.6 (L) 12/08/2021 1545   GFRNONAA 58 (L) 12/10/2021 0407   GFRNONAA >60 12/09/2021 0316   GFRNONAA 57 (L) 12/08/2021 1545   GFRAA  09/25/2009 1130    >60        The eGFR has been calculated using the MDRD equation. This calculation has not been validated in all clinical situations. eGFR's persistently <60 mL/min signify possible Chronic Kidney Disease.   GFRAA 110 02/16/2007 1508   CBC    Component Value Date/Time   WBC 4.3 12/08/2021 1545   RBC 4.74 12/08/2021 1545   HGB 14.8 12/08/2021 1545   HCT 43.6 12/08/2021 1545   PLT 209 12/08/2021 1545   MCV 92.0 12/08/2021 1545   MCH 31.2 12/08/2021 1545   MCHC 33.9 12/08/2021 1545   RDW 13.1 12/08/2021 1545   LYMPHSABS 1.0 12/08/2021 1545   MONOABS 0.5 12/08/2021 1545   EOSABS 0.1 12/08/2021 1545   BASOSABS 0.0 12/08/2021 1545   HEPATIC Function Panel Recent Labs    01/02/21 0807 12/08/21 1545  PROT 5.6* 5.5*  ALBUMIN 3.9 3.6  AST 38* 31  ALT 42 23  ALKPHOS 57 53   HEMOGLOBIN A1C No results found for: "MPG" CARDIAC ENZYMES No results found for: "CKTOTAL", "CKMB", "CKMBINDEX", "TROPONINI" BNP No results for input(s): "PROBNP" in the last 8760  hours. TSH Recent Labs    01/02/21 0807  TSH 2.24   CHOLESTEROL Recent Labs    01/02/21 0807  CHOL 139    Scheduled Meds:  [START ON 12/12/2021] aspirin  81 mg Oral Pre-Cath   baricitinib  2 mg Oral Daily   donepezil  5 mg Oral QHS   enoxaparin (LOVENOX) injection  40 mg Subcutaneous L73P   folic acid  1 mg Oral Daily   memantine  10 mg Oral BID   mirabegron ER  25 mg Oral Daily   sodium chloride flush  3 mL Intravenous Q12H   sodium chloride flush  3 mL Intravenous Q12H   Continuous Infusions:  sodium chloride     sodium chloride 50 mL/hr at 12/11/21 1617   PRN Meds:.sodium chloride, sodium chloride flush  Assessment/Plan:  Syncope Ischemic cardiomyopathy Rheumatoid arthritis Dementia  Plan: Cardiac  cath in AM   LOS: 0 days   Time spent including chart review, lab review, examination, discussion with patient : 30 min   [name]  MD  12/11/2021, 6:08 PM

## 2021-12-11 NOTE — Consult Note (Signed)
Ref: Abner Greenspan, MD   Subjective:  Awake.  Patient and wife aware of abnormal stress test result.. Both are interested in cardiac cath for further evaluation and treatment.  Objective:  Vital Signs in the last 24 hours: Temp:  [97.4 F (36.3 C)-98.3 F (36.8 C)] 97.6 F (36.4 C) (08/01 1504) Pulse Rate:  [51-82] 60 (08/01 1504) Cardiac Rhythm: Sinus bradycardia;Normal sinus rhythm;Bundle branch block (08/01 1130) Resp:  [18] 18 (08/01 1504) BP: (111-131)/(65-80) 120/74 (08/01 1504) SpO2:  [98 %-99 %] 99 % (08/01 1504) Weight:  [109.2 kg] 109.2 kg (08/01 0541)  Physical Exam: BP Readings from Last 1 Encounters:  12/11/21 120/74     Wt Readings from Last 1 Encounters:  12/11/21 109.2 kg    Weight change:  Body mass index is 31.33 kg/m. HEENT: [place]/AT, Eyes-Blue, Conjunctiva-Pink, Sclera-Non-icteric Neck: No JVD, No bruit, Trachea midline. Lungs:  Clear, Bilateral. Cardiac:  Regular rhythm, normal S1 and S2, no S3. II/VI systolic murmur. Abdomen:  Soft, non-tender. BS present. Extremities:  No edema present. No cyanosis. No clubbing. CNS: AxOx2, Cranial nerves grossly intact, moves all 4 extremities.  Skin: Warm and dry.   Intake/Output from previous day: 07/31 0701 - 08/01 0700 In: 3 [I.V.:3] Out: -     Lab Results: BMET    Component Value Date/Time   NA 140 12/10/2021 0407   NA 138 12/09/2021 0316   NA 139 12/08/2021 1545   K 3.7 12/10/2021 0407   K 4.1 12/09/2021 0316   K 4.6 12/08/2021 1545   CL 105 12/10/2021 0407   CL 107 12/09/2021 0316   CL 107 12/08/2021 1545   CO2 29 12/10/2021 0407   CO2 24 12/09/2021 0316   CO2 26 12/08/2021 1545   GLUCOSE 94 12/10/2021 0407   GLUCOSE 118 (H) 12/09/2021 0316   GLUCOSE 124 (H) 12/08/2021 1545   BUN 21 12/10/2021 0407   BUN 21 12/09/2021 0316   BUN 19 12/08/2021 1545   CREATININE 1.28 (H) 12/10/2021 0407   CREATININE 1.23 12/09/2021 0316   CREATININE 1.29 (H) 12/08/2021 1545   CALCIUM 8.6 (L) 12/10/2021  0407   CALCIUM 8.6 (L) 12/09/2021 0316   CALCIUM 8.6 (L) 12/08/2021 1545   GFRNONAA 58 (L) 12/10/2021 0407   GFRNONAA >60 12/09/2021 0316   GFRNONAA 57 (L) 12/08/2021 1545   GFRAA  09/25/2009 1130    >60        The eGFR has been calculated using the MDRD equation. This calculation has not been validated in all clinical situations. eGFR's persistently <60 mL/min signify possible Chronic Kidney Disease.   GFRAA 110 02/16/2007 1508   CBC    Component Value Date/Time   WBC 4.3 12/08/2021 1545   RBC 4.74 12/08/2021 1545   HGB 14.8 12/08/2021 1545   HCT 43.6 12/08/2021 1545   PLT 209 12/08/2021 1545   MCV 92.0 12/08/2021 1545   MCH 31.2 12/08/2021 1545   MCHC 33.9 12/08/2021 1545   RDW 13.1 12/08/2021 1545   LYMPHSABS 1.0 12/08/2021 1545   MONOABS 0.5 12/08/2021 1545   EOSABS 0.1 12/08/2021 1545   BASOSABS 0.0 12/08/2021 1545   HEPATIC Function Panel Recent Labs    01/02/21 0807 12/08/21 1545  PROT 5.6* 5.5*  ALBUMIN 3.9 3.6  AST 38* 31  ALT 42 23  ALKPHOS 57 53   HEMOGLOBIN A1C No results found for: "MPG" CARDIAC ENZYMES No results found for: "CKTOTAL", "CKMB", "CKMBINDEX", "TROPONINI" BNP No results for input(s): "PROBNP" in the last 8760  hours. TSH Recent Labs    01/02/21 0807  TSH 2.24   CHOLESTEROL Recent Labs    01/02/21 0807  CHOL 139    Scheduled Meds:  [START ON 12/12/2021] aspirin  81 mg Oral Pre-Cath   baricitinib  2 mg Oral Daily   donepezil  5 mg Oral QHS   enoxaparin (LOVENOX) injection  40 mg Subcutaneous F84K   folic acid  1 mg Oral Daily   memantine  10 mg Oral BID   mirabegron ER  25 mg Oral Daily   sodium chloride flush  3 mL Intravenous Q12H   sodium chloride flush  3 mL Intravenous Q12H   Continuous Infusions:  sodium chloride     sodium chloride 50 mL/hr at 12/11/21 1617   PRN Meds:.sodium chloride, sodium chloride flush  Assessment/Plan:  Syncope Ischemic cardiomyopathy Rheumatoid arthritis Dementia  Plan: Cardiac  cath in AM   LOS: 0 days   Time spent including chart review, lab review, examination, discussion with patient : 30 min   [name]  MD  12/11/2021, 6:08 PM

## 2021-12-11 NOTE — Care Management (Signed)
1228 12-11-21 Case Manager received notification that the patient is a member of the [facility]. Patients PCP is Dr. Arlester Marker and CSW is Maylon Cos [phone] ext [phone]. Physical Therapy has worked with the patient and recommendations stated No PT follow-up. Case Manager will continue to follow for additional transition of care needs.

## 2021-12-12 ENCOUNTER — Encounter (HOSPITAL_COMMUNITY)
Admission: EM | Disposition: A | Discharge status: Home / Self Care | Payer: Self-pay | Source: Home / Self Care | Attending: Internal Medicine

## 2021-12-12 DIAGNOSIS — R55 Syncope and collapse: Secondary | ICD-10-CM | POA: Diagnosis not present

## 2021-12-12 DIAGNOSIS — I2510 Atherosclerotic heart disease of native coronary artery without angina pectoris: Secondary | ICD-10-CM | POA: Diagnosis not present

## 2021-12-12 HISTORY — PX: CORONARY STENT INTERVENTION: CATH118234

## 2021-12-12 HISTORY — PX: LEFT HEART CATH AND CORONARY ANGIOGRAPHY: CATH118249

## 2021-12-12 LAB — CBC
HCT: 40.2 % (ref 39.0–52.0)
Hemoglobin: 14 g/dL (ref 13.0–17.0)
MCH: 31.8 pg (ref 26.0–34.0)
MCHC: 34.8 g/dL (ref 30.0–36.0)
MCV: 91.4 fL (ref 80.0–100.0)
Platelets: 182 10*3/uL (ref 150–400)
RBC: 4.4 MIL/uL (ref 4.22–5.81)
RDW: 12.9 % (ref 11.5–15.5)
WBC: 5 10*3/uL (ref 4.0–10.5)
nRBC: 0 % (ref 0.0–0.2)

## 2021-12-12 LAB — POCT ACTIVATED CLOTTING TIME
Activated Clotting Time: 263 seconds
Activated Clotting Time: 269 seconds
Activated Clotting Time: 251 seconds
Activated Clotting Time: 209 seconds
Activated Clotting Time: 221 seconds
Activated Clotting Time: 185 seconds

## 2021-12-12 LAB — CREATININE, SERUM
Creatinine, Ser: 1.09 mg/dL (ref 0.61–1.24)
GFR, Estimated: >60 mL/min (ref >60)

## 2021-12-12 LAB — GLUCOSE, CAPILLARY: Glucose-Capillary: 105 mg/dL — ABNORMAL HIGH (ref 70–99)

## 2021-12-12 SURGERY — LEFT HEART CATH AND CORONARY ANGIOGRAPHY
Anesthesia: LOCAL

## 2021-12-12 MED ORDER — NITROGLYCERIN 1 MG/10 ML FOR IR/CATH LAB
INTRA_ARTERIAL | Status: AC
Start: 2021-12-12 — End: ?
  Filled 2021-12-12: qty 10

## 2021-12-12 MED ORDER — ZOLPIDEM TARTRATE 5 MG PO TABS
5.0000 mg | ORAL_TABLET | Freq: Once | ORAL | Status: AC
  Administered 2021-12-12: 5 mg via ORAL
  Filled 2021-12-12: qty 1

## 2021-12-12 MED ORDER — HEPARIN SODIUM (PORCINE) 1000 UNIT/ML IJ SOLN: INTRAMUSCULAR | Status: DC | PRN

## 2021-12-12 MED ORDER — IOHEXOL 350 MG/ML SOLN
INTRAVENOUS | Status: DC | PRN
  Administered 2021-12-12: 55 mL

## 2021-12-12 MED ORDER — HEPARIN SODIUM (PORCINE) 1000 UNIT/ML IJ SOLN
INTRAMUSCULAR | Status: AC
Start: 2021-12-12 — End: ?
  Filled 2021-12-12: qty 10

## 2021-12-12 MED ORDER — VERAPAMIL HCL 2.5 MG/ML IV SOLN
INTRAVENOUS | Status: AC
  Filled 2021-12-12: qty 2

## 2021-12-12 MED ORDER — MIDAZOLAM HCL 2 MG/2ML IJ SOLN
INTRAMUSCULAR | Status: AC
  Filled 2021-12-12: qty 2

## 2021-12-12 MED ORDER — MIDAZOLAM HCL 2 MG/2ML IJ SOLN
INTRAMUSCULAR | Status: DC | PRN
  Administered 2021-12-12: 1 mg via INTRAVENOUS

## 2021-12-12 MED ORDER — FENTANYL CITRATE (PF) 100 MCG/2ML IJ SOLN
INTRAMUSCULAR | Status: DC | PRN
  Administered 2021-12-12: 25 ug via INTRAVENOUS

## 2021-12-12 MED ORDER — NITROGLYCERIN 1 MG/10 ML FOR IR/CATH LAB
INTRA_ARTERIAL | Status: DC | PRN
  Administered 2021-12-12 (×2): 200 ug via INTRACORONARY

## 2021-12-12 MED ORDER — SODIUM CHLORIDE 0.9 % IV SOLN: 250.0000 mL | INTRAVENOUS | Status: DC | PRN

## 2021-12-12 MED ORDER — CLOPIDOGREL BISULFATE 300 MG PO TABS: ORAL_TABLET | ORAL | Status: DC | PRN

## 2021-12-12 MED ORDER — SODIUM CHLORIDE 0.9% FLUSH
3.0000 mL | Freq: Two times a day (BID) | INTRAVENOUS | Status: DC
  Administered 2021-12-13: 3 mL via INTRAVENOUS

## 2021-12-12 MED ORDER — ACETAMINOPHEN 325 MG PO TABS
ORAL_TABLET | ORAL | Status: AC
  Filled 2021-12-12: qty 2

## 2021-12-12 MED ORDER — ALPRAZOLAM 0.25 MG PO TABS
ORAL_TABLET | ORAL | Status: AC
Start: 2021-12-12 — End: ?
  Filled 2021-12-12: qty 1

## 2021-12-12 MED ORDER — IOHEXOL 350 MG/ML SOLN
INTRAVENOUS | Status: DC | PRN
  Administered 2021-12-12: 105 mL

## 2021-12-12 MED ORDER — CLOPIDOGREL BISULFATE 300 MG PO TABS
ORAL_TABLET | ORAL | Status: AC
  Filled 2021-12-12: qty 1

## 2021-12-12 MED ORDER — HEPARIN (PORCINE) IN NACL 1000-0.9 UT/500ML-% IV SOLN
INTRAVENOUS | Status: DC | PRN
  Administered 2021-12-12 (×2): 500 mL

## 2021-12-12 MED ORDER — CLOPIDOGREL BISULFATE 300 MG PO TABS
ORAL_TABLET | ORAL | Status: DC | PRN
  Administered 2021-12-12 (×2): 300 mg via ORAL

## 2021-12-12 MED ORDER — CLOPIDOGREL BISULFATE 75 MG PO TABS
75.0000 mg | ORAL_TABLET | Freq: Every day | ORAL | Status: DC
  Administered 2021-12-13: 75 mg via ORAL
  Filled 2021-12-12: qty 1

## 2021-12-12 MED ORDER — FENTANYL CITRATE (PF) 100 MCG/2ML IJ SOLN
INTRAMUSCULAR | Status: AC
  Filled 2021-12-12: qty 2

## 2021-12-12 MED ORDER — HEPARIN SODIUM (PORCINE) 1000 UNIT/ML IJ SOLN
INTRAMUSCULAR | Status: AC
  Filled 2021-12-12: qty 10

## 2021-12-12 MED ORDER — HEPARIN (PORCINE) IN NACL 1000-0.9 UT/500ML-% IV SOLN
INTRAVENOUS | Status: AC
Start: 2021-12-12 — End: ?
  Filled 2021-12-12: qty 1000

## 2021-12-12 MED ORDER — HEPARIN SODIUM (PORCINE) 1000 UNIT/ML IJ SOLN
INTRAMUSCULAR | Status: DC | PRN
  Administered 2021-12-12: 9000 [IU] via INTRAVENOUS

## 2021-12-12 MED ORDER — ASPIRIN 81 MG PO CHEW
81.0000 mg | CHEWABLE_TABLET | Freq: Every day | ORAL | Status: DC
  Administered 2021-12-13: 81 mg via ORAL
  Filled 2021-12-12: qty 1

## 2021-12-12 MED ORDER — HEPARIN SODIUM (PORCINE) 1000 UNIT/ML IJ SOLN
INTRAMUSCULAR | Status: DC | PRN
  Administered 2021-12-12: 2000 [IU] via INTRAVENOUS

## 2021-12-12 MED ORDER — SODIUM CHLORIDE 0.9 % WEIGHT BASED INFUSION
1.0000 mL/kg/h | INTRAVENOUS | Status: AC
  Administered 2021-12-12: 1 mL/kg/h via INTRAVENOUS

## 2021-12-12 MED ORDER — LABETALOL HCL 5 MG/ML IV SOLN: 10.0000 mg | INTRAVENOUS | Status: AC | PRN

## 2021-12-12 MED ORDER — HYDRALAZINE HCL 20 MG/ML IJ SOLN: 10.0000 mg | INTRAMUSCULAR | Status: AC | PRN

## 2021-12-12 MED ORDER — ALPRAZOLAM 0.25 MG PO TABS
0.2500 mg | ORAL_TABLET | Freq: Once | ORAL | Status: AC
Start: 2021-12-12 — End: 2021-12-12
  Administered 2021-12-12: 0.25 mg via ORAL

## 2021-12-12 MED ORDER — ACETAMINOPHEN 325 MG PO TABS
650.0000 mg | ORAL_TABLET | ORAL | Status: DC | PRN
  Administered 2021-12-12: 650 mg via ORAL

## 2021-12-12 MED ORDER — SODIUM CHLORIDE 0.9% FLUSH: 3.0000 mL | INTRAVENOUS | Status: DC | PRN

## 2021-12-12 MED ORDER — LIDOCAINE HCL (PF) 1 % IJ SOLN
INTRAMUSCULAR | Status: DC | PRN
  Administered 2021-12-12: 5 mL

## 2021-12-12 MED ORDER — LIDOCAINE HCL (PF) 1 % IJ SOLN
INTRAMUSCULAR | Status: AC
  Filled 2021-12-12: qty 30

## 2021-12-12 MED ORDER — ONDANSETRON HCL 4 MG/2ML IJ SOLN: 4.0000 mg | Freq: Four times a day (QID) | INTRAMUSCULAR | Status: DC | PRN

## 2021-12-12 SURGICAL SUPPLY — 33 items
BALL SAPPHIRE NC24 2.75X15 (BALLOONS) ×2
BALL SAPPHIRE NC24 3.25X12 (BALLOONS) ×2
BALLN EMERGE MR 2.25X12 (BALLOONS) ×2
BALLN SCOREFLEX 2.50X10 (BALLOONS) ×2
BALLN SCOREFLEX 3.0X15 (BALLOONS) ×2
BALLOON EMERGE MR 2.25X12 (BALLOONS) IMPLANT
BALLOON SAPPHIRE NC24 2.75X15 (BALLOONS) IMPLANT
BALLOON SAPPHIRE NC24 3.25X12 (BALLOONS) IMPLANT
BALLOON SCOREFLEX 2.50X10 (BALLOONS) IMPLANT
BALLOON SCOREFLEX 3.0X15 (BALLOONS) IMPLANT
CATH BALLN WEDGE 5F 110CM (CATHETERS) ×1 IMPLANT
CATH INFINITI 5FR MULTPACK ANG (CATHETERS) ×1 IMPLANT
CATH VISTA GUIDE 6FR XBLAD3.5 (CATHETERS) ×1 IMPLANT
ELECT DEFIB PAD ADLT CADENCE (PAD) ×1 IMPLANT
GLIDESHEATH SLEND A-KIT 6F 22G (SHEATH) IMPLANT
GUIDEWIRE INQWIRE 1.5J.035X260 (WIRE) IMPLANT
INQWIRE 1.5J .035X260CM (WIRE)
KIT ENCORE 26 ADVANTAGE (KITS) ×1 IMPLANT
KIT HEART LEFT (KITS) ×2 IMPLANT
MAT PREVALON FULL STRYKER (MISCELLANEOUS) ×1 IMPLANT
PACK CARDIAC CATHETERIZATION (CUSTOM PROCEDURE TRAY) ×2 IMPLANT
SHEATH GLIDE SLENDER 4/5FR (SHEATH) IMPLANT
SHEATH PINNACLE 5F 10CM (SHEATH) ×1 IMPLANT
SHEATH PINNACLE 6F 10CM (SHEATH) ×1 IMPLANT
STENT SYNERGY XD 2.50X20 (Permanent Stent) IMPLANT
STENT SYNERGY XD 3.0X20 (Permanent Stent) IMPLANT
STENT SYNERGY XD 3.0X24 (Permanent Stent) IMPLANT
SYNERGY XD 2.50X20 (Permanent Stent) ×2 IMPLANT
SYNERGY XD 3.0X20 (Permanent Stent) IMPLANT
SYNERGY XD 3.0X24 (Permanent Stent) ×2 IMPLANT
TRANSDUCER W/STOPCOCK (MISCELLANEOUS) ×2 IMPLANT
WIRE ASAHI PROWATER 180CM (WIRE) ×1 IMPLANT
WIRE EMERALD 3MM-J .035X150CM (WIRE) ×1 IMPLANT

## 2021-12-12 NOTE — Interval H&P Note (Signed)
History and Physical Interval Note:  12/12/2021 12:06 PM  Troy [name]  has presented today for surgery, with the diagnosis of cad.  The various methods of treatment have been discussed with the patient and family. After consideration of risks, benefits and other options for treatment, the patient has consented to  Procedure(s): LEFT HEART CATH AND CORONARY ANGIOGRAPHY (N/A) as a surgical intervention.  The patient's history has been reviewed, patient examined, no change in status, stable for surgery.  I have reviewed the patient's chart and labs.  Questions were answered to the patient's satisfaction.     Ricki Rodriguez

## 2021-12-12 NOTE — Brief Op Note (Signed)
[facility]   BRIEF COMBINATION CATHETERIZATION AND PCI NOTE  NAME: JAYLEND REILAND   MRN: [id number] 12/12/2021 1:53 PM  SURGEON:  Surgeon(s) and Role: Panel 1:    * [name], MD - Primary - Diagnostic Left Heart Cath-Coronary Angio Panel 2 INTERVENTION:    Leonie Man, MD - Primary -  PROCEDURE:  Procedure(s): LEFT HEART CATH AND CORONARY ANGIOGRAPHY (N/A) CORONARY STENT INTERVENTION (N/A)  PATIENT:  Troy [name]  76 y.o. male being followed in the hospital by Dr. Doylene Canard in consultation who presented with syncope and ischemic cardiomyopathy, abnormal nuclear stress test suggesting anterior ischemia.  EF by echo was 40 to 45%.  Nuclear stress test showed small focus of reversible ischemia in the apical anterior segment possible large fixed defect in the inferolateral wall consistent with scar (consistent with LCx/RCA infarct).  EF estimated 40%.-INTERMEDIATE RISK.  PRE-OPERATIVE DIAGNOSIS:  cad/abnormal nuclear stress test with anterior wall ischemia.  PROCEDURE:  LEFT HEART CATH AND CORONARY ANGIOGRAPHY (N/A) 5 French RFA sheath-modified Seldinger technique LCA angiography: JL 4 catheter RCA angiography and LV hemodynamics/AOV pullback: JR4 catheter  POST-OPERATIVE DIAGNOSIS:  Abnormal nuclear stress test with anterior ischemia  Severe two-vessel occlusive CAD: LAD: (prox 85%- @ D1 & mid 95% @ D2 => consistent with culprit lesion given anterior ischemia on stress test. Dominant LCx: Relatively normal until final bifurcation into LPL and L PDA.  LPDA is 100% CTO with retrograde filling from the nondominant RCA to the flush lesion site.  (Recommend medical therapy-documented infarction on Myoview.) Moderate size nondominant RCA which collateralizes the LPDA  Case reviewed with Dr. [name].  I was consulted to assist with PCI.  It does appear that the occluded LCx is consistent with the infarct noted on Myoview.  There is anterior ischemia on the Myoview as well as a  decreased EF.  Therefore for mortality benefit, we chose to proceed with PCI of the LAD at both lesions.  Lesions do appear to be somewhat calcified therefore the plan is to use ScoreFlex atherotomy, and possible shockwave lithotripsy.   CORONARY STENT INTERVENTION (N/A) -  2 Site LAD PCI (prox 85%- @ D1 & mid 95% @ D2) 5 [place] sheath exchanged for 6 [place] sheath 6 [place] XB LAD 3.5 guide catheter; Prowater wire Lesion 1 (mid LAD 95% reduced to 0%, D2 with 30% ostial): Predilation with 2.25 x 12 mm 10 ATM, 20 sec => ScoreFlex atherotomy 2.5 mm x 10 mm 10 ATM, 20 sec => Synergy DES 2.5 mm x 20 mm deployed to 2.6 mm distally, postdilated proximally to 2.8 mm.  (2.75 mm x 15 mm balloon-18 ATM x 20 sec) Lesion 2 (proximal LAD 85% at D1): Predilation with 2.25 x 12 mm 10 ATM, 20 sec => ScoreFlex atherotomy 2.5 mm x 10 mm 10 ATM x 20 sec=> ScoreFlex 3.0 x 15 => Synergy DES 3.0 mm x 24 mm deployed to 3.2 mm , postdilated proximally to 3.3 mm.  (3.25 mm x 15 mm balloon-18 ATM x, 20 sec)  POST-OPERATIVE DIAGNOSIS: Successful 2 site PCI of the LAD with 2 not overlapping DES stents (as noted)  ANESTHESIA:   IV sedation (diagnostic-1 mg Versed, 25 mg fentanyl, additional 25 mcg Fentanyl given for PCI)  EBL:  < 50 mL   SPECIMEN:  Source of Specimen:  Arterial blood from sheath for ACT collection  COUNTS:  YES  DICTATION: .Note written in EPIC  PLAN OF CARE:  The patient will go to the PACU holding  area for sheath removal after ACT goes below 170 sec.  He will then be transferred back to 6 E. for overnight monitoring with anticipated discharge in the morning per Dr. Doylene Canard.  With ACS presentation would recommend 1 year DAPT aspirin and Plavix.Marland Kitchen  PATIENT DISPOSITION:  PACU - hemodynamically stable.   Delay start of Pharmacological VTE agent (>24hrs) due to surgical blood loss or risk of bleeding: not applicable     Leonie Man, MD, MS Glenetta Hew, M.D., M.S. Interventional Cardiologist   [place]  Pager # [phone] Phone # [phone] [address]. [place], [place] [address]

## 2021-12-12 NOTE — Progress Notes (Signed)
PROGRESS NOTE  Troy [name]  RXV:[id number] DOB: 13-Sep-1945 DOA: 12/08/2021 PCP: Judy Pimple, MD   Brief Narrative:  Patient is a 76 year old male with history of dementia, rheumatoid arthritis on methotrexate, hyperlipidemia who presented with syncope.  As per the report, patient was sitting on the bed of his truck outside in the shade watching a friend fix his mower.  He later felt dizzy and had witnessed loss of consciousness by his friend.  Wife then came out and saw him vomiting with mucus coming of his nose.  On condition he was bradycardic to 50s, normotensive.  Labs showed mild AKI with creatinine of 1.2.  Chest x-ray did not show any pneumonia or other findings.  EKG showed left bundle branch block which is not a new finding.  Patient admitted for syncopal management.  Echo showed diminished left ventricular function, positive nuclear stress test.Plan for cardiac cath today  Assessment & Plan:  Principal Problem:   Syncope Active Problems:   Rheumatoid arthritis (HCC)   Dementia (HCC)   AKI (acute kidney injury) (HCC)   Systolic CHF (HCC)   Syncope: Events as above.  Currently he is hemodynamically stable.  Monitor on telemetry.  EKG shows left bundle branch block which is not a new finding.  Echo as below.  Orthostatic vitals were found to be negative .PT/OT evaluation done, no follow-up recommended  Systolic congestive heart failure: New onset.  Currently euvolemic.  No clinical signs of heart failure.  Echo done here showed EF of 40 to 45%, anterior wall hypokinesia.  Troponin is still normal.  No chest pain.He has mildly elevated BNP.  Cardiology consulted.Underwent nuclear stress test which was positive for reversible ischemia.Plan for cardiac cath   AKI: Resolved with IV fluids  Dementia: Alert and oriented to place but not to time.  Continue supportive care.  Delirium precautions.  Continue Aricept, Namenda  Rheumatoid arthritis: On methotrexate.  Also on clinical  trial with baricitinib        DVT prophylaxis:enoxaparin (LOVENOX) injection 40 mg Start: 12/08/21 1945     Code Status: Full Code  Family Communication: wife at bedside on 8/2  Patient status:Inpatient  Patient is from :Home  Anticipated discharge JK:DTOI  Estimated DC date:after full ischemic work up   Consultants: None  Procedures:None  Antimicrobials:  Anti-infectives (From admission, onward)    None       Subjective: Patient seen and examined at bedside this mrng.remains comfortable,hemodynamically stable without any complains  Objective: Vitals:   12/12/21 1410 12/12/21 1415 12/12/21 1425 12/12/21 1445  BP: 131/70 126/74 138/69 121/69  Pulse: (!) 55 (!) 51 (!) 58 (!) 53  Resp: 16 20 18  (!) 21  Temp:      TempSrc:      SpO2: 99% 99% 100% 100%  Weight:      Height:        Intake/Output Summary (Last 24 hours) at 12/12/2021 1547 Last data filed at 12/12/2021 1500 Gross per 24 hour  Intake 532.33 ml  Output 350 ml  Net 182.33 ml   Filed Weights   12/08/21 2120 12/09/21 0647 12/11/21 0541  Weight: 111.5 kg 107.8 kg 109.2 kg    Examination:  General exam: Overall comfortable, not in distress,pleasantly confused HEENT: PERRL Respiratory system:  no wheezes or crackles  Cardiovascular system: S1 & S2 heard, RRR.  Gastrointestinal system: Abdomen is nondistended, soft and nontender. Central nervous system: Alert and awake,comfused Extremities: No edema, no clubbing ,no cyanosis Skin: No rashes, no  ulcers,no icterus     Data Reviewed: I have personally reviewed following labs and imaging studies  CBC: Recent Labs  Lab 12/08/21 1545  WBC 4.3  NEUTROABS 2.7  HGB 14.8  HCT 43.6  MCV 92.0  PLT 209   Basic Metabolic Panel: Recent Labs  Lab 12/08/21 1545 12/09/21 0316 12/10/21 0407  NA 139 138 140  K 4.6 4.1 3.7  CL 107 107 105  CO2 26 24 29   GLUCOSE 124* 118* 94  BUN 19 21 21   CREATININE 1.29* 1.23 1.28*  CALCIUM 8.6* 8.6* 8.6*   MG  --   --  2.0     No results found for this or any previous visit (from the past 240 hour(s)).   Radiology Studies: NM Myocar Multi W/Spect W/Wall Motion / EF  Result Date: 12/11/2021 CLINICAL DATA:  High risk coronary artery disease. 76 year old male. EXAM: MYOCARDIAL IMAGING WITH SPECT (REST AND PHARMACOLOGIC-STRESS) GATED LEFT VENTRICULAR WALL MOTION STUDY LEFT VENTRICULAR EJECTION FRACTION TECHNIQUE: Standard myocardial SPECT imaging was performed after resting intravenous injection of 10 mCi Tc-11m tetrofosmin. Subsequently, intravenous infusion of Lexiscan was performed under the supervision of the Cardiology staff. At peak effect of the drug, 30 mCi Tc-18m tetrofosmin was injected intravenously and standard myocardial SPECT imaging was performed. Quantitative gated imaging was also performed to evaluate left ventricular wall motion, and estimate left ventricular ejection fraction. COMPARISON:  None Available. FINDINGS: Perfusion: Small focus moderate decreased counts within the apical segment the anterior septal wall which improves from stress to rest. Larger more severe defect involving the apical, mid and basilar segment of the inferior and inferolateral wall which is fixed on rest and stress. Wall Motion: No focal wall motion abnormality. LEFT ventricular dilatation Left Ventricular Ejection Fraction: 42 % End diastolic volume 183 ml End systolic volume 106 ml IMPRESSION: 1. Small focus of reversible ischemia in the apical segment anterior septal wall. 2. Larger fixed defect in the inferolateral wall consistent with scar. 3. Normal left ventricular wall motion.  LEFT ventricular dilatation 4. Left ventricular ejection fraction 42% 5. Non invasive risk stratification*: Intermediate *2012 Appropriate Use Criteria for Coronary Revascularization Focused Update: J Am Coll Cardiol. 2012;59(9):857-881. http://content.84m.aspx?articleid=[email or web address] These results will be called to the  ordering clinician or representative by the Radiologist Assistant, and communication documented in the PACS or 12-21-1981. Electronically Signed   By: [email or web address] M.D.   On: 12/11/2021 11:36    Scheduled Meds:  [START ON 12/13/2021] aspirin  81 mg Oral Daily   [MAR Hold] baricitinib  2 mg Oral Daily   [START ON 12/13/2021] clopidogrel  75 mg Oral Q breakfast   [MAR Hold] donepezil  5 mg Oral QHS   [MAR Hold] enoxaparin (LOVENOX) injection  40 mg Subcutaneous Q24H   [MAR Hold] folic acid  1 mg Oral Daily   [MAR Hold] memantine  10 mg Oral BID   [MAR Hold] mirabegron ER  25 mg Oral Daily   [MAR Hold] sodium chloride flush  3 mL Intravenous Q12H   [MAR Hold] sodium chloride flush  3 mL Intravenous Q12H   Continuous Infusions:  sodium chloride     sodium chloride 50 mL/hr at 12/12/21 1030   sodium chloride 1 mL/kg/hr (12/12/21 1419)     LOS: 1 day   02/11/22, MD [facility] P8/06/2021, 3:47 PM

## 2021-12-12 NOTE — Progress Notes (Signed)
Pt with history of dementia, Spoke with Dr Algie Coffer, med order obtained to keep pt relaxed and help with back discomfort with bedrest, sheath remains in place to right groin, approved by supervisor to allow pt's wife to bedside in holding area, wife at bedside at 1520, safety maintained, See MAR, support given

## 2021-12-12 NOTE — Plan of Care (Signed)
  Problem: Education: Goal: Knowledge of General Education information will improve Description: Including pain rating scale, medication(s)/side effects and non-pharmacologic comfort measures Outcome: Progressing   Problem: Health Behavior/Discharge Planning: Goal: Ability to manage health-related needs will improve Outcome: Progressing   Problem: Clinical Measurements: Goal: Ability to maintain clinical measurements within normal limits will improve Outcome: Progressing Goal: Will remain free from infection Outcome: Progressing Goal: Diagnostic test results will improve Outcome: Progressing Goal: Respiratory complications will improve Outcome: Progressing Goal: Cardiovascular complication will be avoided Outcome: Progressing   Problem: Activity: Goal: Risk for activity intolerance will decrease Outcome: Progressing   Problem: Nutrition: Goal: Adequate nutrition will be maintained Outcome: Progressing   Problem: Coping: Goal: Level of anxiety will decrease Outcome: Progressing   Problem: Elimination: Goal: Will not experience complications related to bowel motility Outcome: Progressing Goal: Will not experience complications related to urinary retention Outcome: Progressing   Problem: Pain Managment: Goal: General experience of comfort will improve Outcome: Progressing   Problem: Safety: Goal: Ability to remain free from injury will improve Outcome: Progressing   Problem: Skin Integrity: Goal: Risk for impaired skin integrity will decrease Outcome: Progressing   Problem: Education: Goal: Understanding of CV disease, CV risk reduction, and recovery process will improve Outcome: Progressing Goal: Individualized Educational Video(s) Outcome: Progressing   Problem: Activity: Goal: Ability to return to baseline activity level will improve Outcome: Progressing   Problem: Cardiovascular: Goal: Ability to achieve and maintain adequate cardiovascular perfusion  will improve Outcome: Progressing Goal: Vascular access site(s) Level 0-1 will be maintained Outcome: Progressing   Problem: Health Behavior/Discharge Planning: Goal: Ability to safely manage health-related needs after discharge will improve Outcome: Progressing   Problem: Education: Goal: Understanding of CV disease, CV risk reduction, and recovery process will improve Outcome: Progressing Goal: Individualized Educational Video(s) Outcome: Progressing   Problem: Activity: Goal: Ability to return to baseline activity level will improve Outcome: Progressing   Problem: Cardiovascular: Goal: Ability to achieve and maintain adequate cardiovascular perfusion will improve Outcome: Progressing Goal: Vascular access site(s) Level 0-1 will be maintained Outcome: Progressing   Problem: Health Behavior/Discharge Planning: Goal: Ability to safely manage health-related needs after discharge will improve Outcome: Progressing   

## 2021-12-12 NOTE — Progress Notes (Signed)
SITE AREA: right groin/femoral  SITE PRIOR TO REMOVAL:  LEVEL 0  PRESSURE APPLIED FOR: approximately 20 minutes  MANUAL: yes  PATIENT STATUS DURING PULL: stable  POST PULL SITE:  LEVEL 0, bruising noted to right groin area  POST PULL INSTRUCTIONS GIVEN: yes  POST PULL PULSES PRESENT: +2 bilateral pedal pulses  DRESSING APPLIED: gauze with tegaderm  BEDREST BEGINS @ 1532  COMMENTS:  wife remains at bedside before, during, and after sheath removal

## 2021-12-13 ENCOUNTER — Other Ambulatory Visit (HOSPITAL_COMMUNITY): Payer: Self-pay

## 2021-12-13 ENCOUNTER — Encounter (HOSPITAL_COMMUNITY): Payer: Self-pay | Admitting: Cardiovascular Disease

## 2021-12-13 DIAGNOSIS — R55 Syncope and collapse: Secondary | ICD-10-CM | POA: Diagnosis not present

## 2021-12-13 LAB — GLUCOSE, CAPILLARY
Glucose-Capillary: 101 mg/dL — ABNORMAL HIGH (ref 70–99)
Glucose-Capillary: 151 mg/dL — ABNORMAL HIGH (ref 70–99)
Glucose-Capillary: 86 mg/dL (ref 70–99)

## 2021-12-13 LAB — CBC
HCT: 37.4 % — ABNORMAL LOW (ref 39.0–52.0)
Hemoglobin: 12.5 g/dL — ABNORMAL LOW (ref 13.0–17.0)
MCH: 31.1 pg (ref 26.0–34.0)
MCHC: 33.4 g/dL (ref 30.0–36.0)
MCV: 93 fL (ref 80.0–100.0)
Platelets: 226 10*3/uL (ref 150–400)
RBC: 4.02 MIL/uL — ABNORMAL LOW (ref 4.22–5.81)
RDW: 13 % (ref 11.5–15.5)
WBC: 5.8 10*3/uL (ref 4.0–10.5)
nRBC: 0 % (ref 0.0–0.2)

## 2021-12-13 LAB — LIPID PANEL
Cholesterol: 172 mg/dL (ref 0–200)
HDL: 51 mg/dL (ref >40)
LDL Cholesterol: 113 mg/dL — ABNORMAL HIGH (ref 0–99)
Total CHOL/HDL Ratio: 3.4 RATIO
Triglycerides: 41 mg/dL (ref <150)
VLDL: 8 mg/dL (ref 0–40)

## 2021-12-13 LAB — BASIC METABOLIC PANEL
Anion gap: 4 — ABNORMAL LOW (ref 5–15)
BUN: 19 mg/dL (ref 8–23)
CO2: 25 mmol/L (ref 22–32)
Calcium: 8.3 mg/dL — ABNORMAL LOW (ref 8.9–10.3)
Chloride: 109 mmol/L (ref 98–111)
Creatinine, Ser: 1.06 mg/dL (ref 0.61–1.24)
GFR, Estimated: >60 mL/min (ref >60)
Glucose, Bld: 104 mg/dL — ABNORMAL HIGH (ref 70–99)
Potassium: 3.9 mmol/L (ref 3.5–5.1)
Sodium: 138 mmol/L (ref 135–145)

## 2021-12-13 MED ORDER — ROSUVASTATIN CALCIUM 5 MG PO TABS
10.0000 mg | ORAL_TABLET | Freq: Every day | ORAL | Status: DC
  Administered 2021-12-13: 10 mg via ORAL
  Filled 2021-12-13: qty 2

## 2021-12-13 MED ORDER — ROSUVASTATIN CALCIUM 10 MG PO TABS
10.0000 mg | ORAL_TABLET | Freq: Every day | ORAL | 1 refills | Status: AC
  Filled 2021-12-13: qty 30, 30d supply, fill #0
  Filled 2022-01-08: qty 30, 30d supply, fill #1

## 2021-12-13 MED ORDER — ASPIRIN 81 MG PO CHEW
81.0000 mg | CHEWABLE_TABLET | Freq: Every day | ORAL | 1 refills | Status: DC
  Filled 2021-12-13: qty 30, 30d supply, fill #0
  Filled 2022-01-08: qty 30, 30d supply, fill #1

## 2021-12-13 MED ORDER — CLOPIDOGREL BISULFATE 75 MG PO TABS
75.0000 mg | ORAL_TABLET | Freq: Every day | ORAL | 1 refills | Status: DC
  Filled 2021-12-13: qty 30, 30d supply, fill #0
  Filled 2022-01-08: qty 30, 30d supply, fill #1

## 2021-12-13 NOTE — Progress Notes (Signed)
CARDIAC REHAB PHASE I     Pt up ambulating in room independently. No CP or SOB today. Wife in room with pt. Plans for home today. Post stent teaching including site care, restrictions, heart healthy diet, antiplatelet therapy importance, risk factors, exercise guidelines, and CRP2 discussed with pt and wife.  Pt has significant memory problems related to dementia. Wife will help pt at home to adhere to precautions and home plans for recovery. No questions or concerns at this time.   [id number] Woodroe Chen, RN BSN 12/13/2021 9:43 AM

## 2021-12-13 NOTE — Progress Notes (Signed)
Nsg Discharge Note  Admit Date:  12/08/2021 Discharge date: 12/13/2021   [name] to be D/C'd Home per MD order.  AVS completed. Patient/caregiver able to verbalize understanding.  Discharge Medication: Allergies as of 12/13/2021       Reactions   Chlorzoxazone    REACTION: dizzy   Elemental Sulfur Rash        Medication List     STOP taking these medications    ciclopirox 0.77 % cream Commonly known as: LOPROX   clotrimazole-betamethasone cream Commonly known as: LOTRISONE   nystatin powder Commonly known as: MYCOSTATIN/NYSTOP       TAKE these medications    aspirin 81 MG chewable tablet Chew 1 tablet (81 mg total) by mouth daily. Start taking on: December 14, 2021   clopidogrel 75 MG tablet Commonly known as: PLAVIX Take 1 tablet (75 mg total) by mouth daily with breakfast. Start taking on: December 14, 2021   donepezil 5 MG tablet Commonly known as: ARICEPT TAKE ONE TABLET BY MOUTH DAILY FOR MEMORY   Elderberry 575 MG/5ML Syrp Take 5 mLs by mouth daily.   folic acid 1 MG tablet Commonly known as: FOLVITE Take 1 mg by mouth daily.   memantine 10 MG tablet Commonly known as: Namenda Take 1 tablet (10 mg total) by mouth 2 (two) times daily.   methotrexate 2.5 MG tablet Commonly known as: RHEUMATREX Take 15 mg by mouth once a week. Take 6 tablets (15 mg) on Saturdays   mirabegron ER 25 MG Tb24 tablet Commonly known as: MYRBETRIQ Take 25 mg by mouth daily.   Olumiant tablet Generic drug: baricitinib Take 2 mg by mouth daily.   rosuvastatin 10 MG tablet Commonly known as: CRESTOR Take 1 tablet (10 mg total) by mouth daily. What changed:  medication strength how much to take        Discharge Assessment: Vitals:   12/13/21 0527 12/13/21 1012  BP: 131/75 117/71  Pulse: 66 67  Resp: 18   Temp: 98.3 F (36.8 C) 98.4 F (36.9 C)  SpO2: 95% 100%   Skin clean, dry and intact without evidence of skin break down, no evidence of skin tears  noted. IV catheter discontinued intact. Site without signs and symptoms of complications - no redness or edema noted at insertion site, patient denies c/o pain - only slight tenderness at site.  Dressing with slight pressure applied.  D/c Instructions-Education: Discharge instructions given to patient/family with verbalized understanding. D/c education completed with patient/family including follow up instructions, medication list, d/c activities limitations if indicated, with other d/c instructions as indicated by MD - patient able to verbalize understanding, all questions fully answered. Patient instructed to return to ED, call 911, or call MD for any changes in condition.  Patient escorted via WC, and D/C home via private auto.  Kizzie Bane, RN 12/13/2021 1:01 PM

## 2021-12-13 NOTE — Consult Note (Signed)
Ref: Abner Greenspan, MD   Subjective:  Feeling better. Ambulated well. Large bruise below the cath site. He had good sleep with Ambien use. VS stable. CBC and BMET are stable. Lipid panel is cooking. Patient used to take Crestor in past. Wife stopped the medicine for he having aches and pain and feeling tired.  Objective:  Vital Signs in the last 24 hours: Temp:  [97.6 F (36.4 C)-98.4 F (36.9 C)] 98.4 F (36.9 C) (08/03 1012) Pulse Rate:  [0-83] 67 (08/03 1012) Cardiac Rhythm: Sinus bradycardia;Bundle branch block;Heart block (08/03 0700) Resp:  [11-23] 18 (08/03 0527) BP: (117-158)/(61-94) 117/71 (08/03 1012) SpO2:  [94 %-100 %] 100 % (08/03 1012) Weight:  [109 kg] 109 kg (08/03 0555)  Physical Exam: BP Readings from Last 1 Encounters:  12/13/21 117/71     Wt Readings from Last 1 Encounters:  12/13/21 109 kg    Weight change:  Body mass index is 31.26 kg/m. HEENT: [place]/AT, Eyes-Blue, Conjunctiva-Pink, Sclera-Non-icteric Neck: No JVD, No bruit, Trachea midline. Lungs:  Clear, Bilateral. Cardiac:  Regular rhythm, normal S1 and S2, no S3. II/VI systolic murmur. Abdomen:  Soft, non-tender. BS present. Extremities:  No edema present. No cyanosis. No clubbing. Dressing over cath sight in right groin. Large 8 to 9 inch area of bruise over right thigh extending into right side of scrotum, non-tender. CNS: AxOx2, Cranial nerves grossly intact, moves all 4 extremities.  Skin: Warm and dry.   Intake/Output from previous day: 08/02 0701 - 08/03 0700 In: 2532.2 [P.O.:360; I.V.:2172.2] Out: 500 [Urine:500]    Lab Results: BMET    Component Value Date/Time   NA 138 12/13/2021 0257   NA 140 12/10/2021 0407   NA 138 12/09/2021 0316   K 3.9 12/13/2021 0257   K 3.7 12/10/2021 0407   K 4.1 12/09/2021 0316   CL 109 12/13/2021 0257   CL 105 12/10/2021 0407   CL 107 12/09/2021 0316   CO2 25 12/13/2021 0257   CO2 29 12/10/2021 0407   CO2 24 12/09/2021 0316   GLUCOSE 104 (H)  12/13/2021 0257   GLUCOSE 94 12/10/2021 0407   GLUCOSE 118 (H) 12/09/2021 0316   BUN 19 12/13/2021 0257   BUN 21 12/10/2021 0407   BUN 21 12/09/2021 0316   CREATININE 1.06 12/13/2021 0257   CREATININE 1.09 12/12/2021 1836   CREATININE 1.28 (H) 12/10/2021 0407   CALCIUM 8.3 (L) 12/13/2021 0257   CALCIUM 8.6 (L) 12/10/2021 0407   CALCIUM 8.6 (L) 12/09/2021 0316   GFRNONAA >60 12/13/2021 0257   GFRNONAA >60 12/12/2021 1836   GFRNONAA 58 (L) 12/10/2021 0407   GFRAA  09/25/2009 1130    >60        The eGFR has been calculated using the MDRD equation. This calculation has not been validated in all clinical situations. eGFR's persistently <60 mL/min signify possible Chronic Kidney Disease.   GFRAA 110 02/16/2007 1508   CBC    Component Value Date/Time   WBC 5.8 12/13/2021 0257   RBC 4.02 (L) 12/13/2021 0257   HGB 12.5 (L) 12/13/2021 0257   HCT 37.4 (L) 12/13/2021 0257   PLT 226 12/13/2021 0257   MCV 93.0 12/13/2021 0257   MCH 31.1 12/13/2021 0257   MCHC 33.4 12/13/2021 0257   RDW 13.0 12/13/2021 0257   LYMPHSABS 1.0 12/08/2021 1545   MONOABS 0.5 12/08/2021 1545   EOSABS 0.1 12/08/2021 1545   BASOSABS 0.0 12/08/2021 1545   HEPATIC Function Panel Recent Labs    01/02/21  7334 12/08/21 1545  PROT 5.6* 5.5*  ALBUMIN 3.9 3.6  AST 38* 31  ALT 42 23  ALKPHOS 57 53   HEMOGLOBIN A1C No results found for: "MPG" CARDIAC ENZYMES No results found for: "CKTOTAL", "CKMB", "CKMBINDEX", "TROPONINI" BNP No results for input(s): "PROBNP" in the last 8760 hours. TSH Recent Labs    01/02/21 0807  TSH 2.24   CHOLESTEROL Recent Labs    01/02/21 0807  CHOL 139    Scheduled Meds:  aspirin  81 mg Oral Daily   baricitinib  2 mg Oral Daily   clopidogrel  75 mg Oral Q breakfast   donepezil  5 mg Oral QHS   enoxaparin (LOVENOX) injection  40 mg Subcutaneous K83I   folic acid  1 mg Oral Daily   memantine  10 mg Oral BID   mirabegron ER  25 mg Oral Daily   rosuvastatin  10  mg Oral Daily   sodium chloride flush  3 mL Intravenous Q12H   sodium chloride flush  3 mL Intravenous Q12H   sodium chloride flush  3 mL Intravenous Q12H   Continuous Infusions:  sodium chloride     PRN Meds:.sodium chloride, acetaminophen, ondansetron (ZOFRAN) IV, sodium chloride flush  Assessment/Plan:  Syncope Multi-vessel CAD S/P LAD stenting Rheumatoid arthritis Dementia  Plan: Add Crestor. F/U in 1-2 weeks.   LOS: 2 days   Time spent including chart review, lab review, examination, discussion with patient/Wife/Family/Nurse : 30 min   [name]  MD  12/13/2021, 11:10 AM

## 2021-12-13 NOTE — Plan of Care (Signed)
  Problem: Clinical Measurements: Goal: Cardiovascular complication will be avoided Outcome: Progressing   Problem: Nutrition: Goal: Adequate nutrition will be maintained Outcome: Progressing   Problem: Elimination: Goal: Will not experience complications related to urinary retention Outcome: Progressing   Problem: Pain Managment: Goal: General experience of comfort will improve Outcome: Progressing   Problem: Safety: Goal: Ability to remain free from injury will improve Outcome: Progressing   Problem: Skin Integrity: Goal: Risk for impaired skin integrity will decrease Outcome: Progressing

## 2021-12-13 NOTE — Plan of Care (Signed)
  Problem: Education: Goal: Knowledge of General Education information will improve Description: Including pain rating scale, medication(s)/side effects and non-pharmacologic comfort measures Outcome: Progressing   Problem: Health Behavior/Discharge Planning: Goal: Ability to manage health-related needs will improve Outcome: Progressing   Problem: Clinical Measurements: Goal: Ability to maintain clinical measurements within normal limits will improve Outcome: Progressing Goal: Will remain free from infection Outcome: Progressing Goal: Diagnostic test results will improve Outcome: Progressing Goal: Respiratory complications will improve Outcome: Progressing Goal: Cardiovascular complication will be avoided Outcome: Progressing   Problem: Activity: Goal: Risk for activity intolerance will decrease Outcome: Progressing   Problem: Nutrition: Goal: Adequate nutrition will be maintained Outcome: Progressing   Problem: Coping: Goal: Level of anxiety will decrease Outcome: Progressing   Problem: Elimination: Goal: Will not experience complications related to bowel motility Outcome: Progressing Goal: Will not experience complications related to urinary retention Outcome: Progressing   Problem: Pain Managment: Goal: General experience of comfort will improve Outcome: Progressing   Problem: Safety: Goal: Ability to remain free from injury will improve Outcome: Progressing   Problem: Skin Integrity: Goal: Risk for impaired skin integrity will decrease Outcome: Progressing   Problem: Education: Goal: Understanding of CV disease, CV risk reduction, and recovery process will improve Outcome: Progressing Goal: Individualized Educational Video(s) Outcome: Progressing   Problem: Activity: Goal: Ability to return to baseline activity level will improve Outcome: Progressing   Problem: Cardiovascular: Goal: Ability to achieve and maintain adequate cardiovascular perfusion  will improve Outcome: Progressing Goal: Vascular access site(s) Level 0-1 will be maintained Outcome: Progressing   Problem: Health Behavior/Discharge Planning: Goal: Ability to safely manage health-related needs after discharge will improve Outcome: Progressing   Problem: Education: Goal: Understanding of CV disease, CV risk reduction, and recovery process will improve Outcome: Progressing Goal: Individualized Educational Video(s) Outcome: Progressing   Problem: Activity: Goal: Ability to return to baseline activity level will improve Outcome: Progressing   Problem: Cardiovascular: Goal: Ability to achieve and maintain adequate cardiovascular perfusion will improve Outcome: Progressing Goal: Vascular access site(s) Level 0-1 will be maintained Outcome: Progressing   Problem: Health Behavior/Discharge Planning: Goal: Ability to safely manage health-related needs after discharge will improve Outcome: Progressing   

## 2021-12-13 NOTE — Discharge Summary (Signed)
Physician Discharge Summary  Troy [name] IRW:[id number] DOB: 19-Jan-1946 DOA: 12/08/2021  PCP: Judy Pimple, MD  Admit date: 12/08/2021 Discharge date: 12/13/2021  Admitted From: Home Disposition:  Home  Discharge Condition:Stable CODE STATUS:FULL Diet recommendation: Heart Healthy  Brief/Interim Summary: Patient is a 76 year old male with history of dementia, rheumatoid arthritis on methotrexate, hyperlipidemia who presented with syncope.  As per the report, patient was sitting on the bed of his truck outside in the shade watching a friend fix his mower.  He later felt dizzy and had witnessed loss of consciousness by his friend.  Wife then came out and saw him vomiting with mucus coming of his nose.  On condition he was bradycardic to 50s, normotensive.  Labs showed mild AKI with creatinine of 1.2.  Chest x-ray did not show any pneumonia or other findings.  EKG showed left bundle branch block which is not a new finding.  Patient admitted for syncopal management.  Echo showed diminished left ventricular function, positive nuclear stress test.  Underwent cardiac catheter that showed significant coronary artery disease, status post placement of 2 DES.  Started on aspirin, Plavix.  Medically stable for discharge home today.  He will follow-up with cardiology as an outpatient.  Following problems were addressed during his hospitalization:  Syncope: Events as above.  Currently he is hemodynamically stable.   EKG shows left bundle branch block which is not a new finding.  Echo as below.  Orthostatic vitals were found to be negative .PT/OT evaluation done, no follow-up recommended   Systolic congestive heart failure: New onset.  Currently euvolemic.  No clinical signs of heart failure.  Echo done here showed EF of 40 to 45%, anterior wall hypokinesia.  Troponin is still normal.  No chest pain.He has mildly elevated BNP.  Cardiology consulted.Underwent nuclear stress test which was positive for  reversible ischemia.  Most likely ischemic cardiomyopathy because of finding of significant coronary disease.  He will follow-up with cardiology as an outpatient  Coronary artery disease: Underwent cardiac cath with finding of 95% stenosis from mid LAD to distal LAD and also proximal LAD to mid LAD.  Status post 2 DES placement.  Continue aspirin, Plavix, statin  AKI: Resolved with IV fluids   Dementia: Alert and oriented to place but not to time.  Continue supportive care.  Delirium precautions.  Continue Aricept, Namenda   Rheumatoid arthritis: On methotrexate.  Also on clinical trial with baricitinib    Discharge Diagnoses:  Principal Problem:   Syncope Active Problems:   Rheumatoid arthritis (HCC)   Dementia (HCC)   AKI (acute kidney injury) (HCC)   Systolic CHF [facility])    Discharge Instructions  Discharge Instructions     Amb Referral to Cardiac Rehabilitation   Complete by: As directed    Diagnosis: Coronary Stents   After initial evaluation and assessments completed: Virtual Based Care may be provided alone or in conjunction with Phase 2 Cardiac Rehab based on patient barriers.: Yes   Diet - low sodium heart healthy   Complete by: As directed    Discharge instructions   Complete by: As directed    1)Please take prescribed medications as instructed 2)Follow up with your PCP in a week 3)Follow up with cardiology in 2 weeks.  Name and number the provider has been attached   Increase activity slowly   Complete by: As directed    No wound care   Complete by: As directed       Allergies as of 12/13/2021  Reactions   Chlorzoxazone    REACTION: dizzy   Elemental Sulfur Rash        Medication List     STOP taking these medications    ciclopirox 0.77 % cream Commonly known as: LOPROX   clotrimazole-betamethasone cream Commonly known as: LOTRISONE   nystatin powder Commonly known as: MYCOSTATIN/NYSTOP       TAKE these medications    aspirin 81 MG  chewable tablet Chew 1 tablet (81 mg total) by mouth daily. Start taking on: December 14, 2021   clopidogrel 75 MG tablet Commonly known as: PLAVIX Take 1 tablet (75 mg total) by mouth daily with breakfast. Start taking on: December 14, 2021   donepezil 5 MG tablet Commonly known as: ARICEPT TAKE ONE TABLET BY MOUTH DAILY FOR MEMORY   Elderberry 575 MG/5ML Syrp Take 5 mLs by mouth daily.   folic acid 1 MG tablet Commonly known as: FOLVITE Take 1 mg by mouth daily.   memantine 10 MG tablet Commonly known as: Namenda Take 1 tablet (10 mg total) by mouth 2 (two) times daily.   methotrexate 2.5 MG tablet Commonly known as: RHEUMATREX Take 15 mg by mouth once a week. Take 6 tablets (15 mg) on Saturdays   mirabegron ER 25 MG Tb24 tablet Commonly known as: MYRBETRIQ Take 25 mg by mouth daily.   Olumiant tablet Generic drug: baricitinib Take 2 mg by mouth daily.   rosuvastatin 10 MG tablet Commonly known as: CRESTOR Take 1 tablet (10 mg total) by mouth daily. What changed:  medication strength how much to take        Follow-up Information     Tower, Audrie Gallus, MD Follow up in 1 week(s).   Specialties: Family Medicine, Radiology Contact information: [address] [place] [address] [phone]         Orpah Cobb, MD Follow up in 2 week(s).   Specialty: Cardiology Contact information: [address] [place] [address] [phone]                Allergies  Allergen Reactions   Chlorzoxazone     REACTION: dizzy   Elemental Sulfur Rash    Consultations: Cardiology   Procedures/Studies: CARDIAC CATHETERIZATION  Result Date: 12/12/2021   LESION #1: Mid LAD to Dist LAD lesion is 95% stenosed with 50% stenosed side branch in 2nd Diag.   Scoring balloon angioplasty performed using a 2.5 mm  x 10 mm ScoreFlex:   After scoring Angioplasty, A drug-eluting stent was successfully placed using a SYNERGY XD 2.50X20 -> postdilated tapered  fashion from 2.8 to 2.6 mm   Post intervention, there is a 0% residual stenosis in the major branch;  side branch stenosis was stable at 50% residual stenosis.   ------------------------------------------------------   LESION #2: Prox LAD to Mid LAD lesion is 85% stenosed with 50% stenosed side branch in 2nd Diag.   Scoring balloon angioplasty performed using a 3.0 mm x 15 mm ScoreFlex:   After scoring balloon angioplasty, A drug-eluting stent was successfully placed using a SYNERGY XD 3.0X24.->  Postdilated to 3.3 mm   Post intervention, there is a 0% residual stenosis in the major branch;  side branch stenosis was stable at 50% residual stenosis. POST-OPERATIVE DIAGNOSIS: Successful 2 site PCI of the LAD with 2 not overlapping DES stents (as noted) Lesion 1 (mid-distal LAD 95% reduced to 0%, D2 with 30% ostial): Synergy DES 2.5 mm x 20 mm (tapered postdilation 2.8-2.6 mm) Lesion 2 (  proximal-mid LAD 85% at D1): Synergy DES 3.0 mm x 24 mm -> postdilated to 3.3 mm RECOMMENDATIONS Return to nursing unit after sheath removed in PACU for holding area Further Care per Dr. Lorretta Harp, MD, MS Glenetta Hew, M.D., M.S. Interventional Cardiologist [place] Pager # [phone] Phone # [phone] [address] [place], [place] [id number]  CARDIAC CATHETERIZATION  Result Date: 12/12/2021   Prox LAD lesion is 80% stenosed.   Mid LAD lesion is 75% stenosed.   LPAV lesion is 100% stenosed.   There is mild left ventricular systolic dysfunction.   The left ventricular ejection fraction is 45-50% by visual estimate. Stenting of LAD by Dr. Ellyn Hack.   NM Myocar Multi W/Spect W/Wall Motion / EF  Result Date: 12/11/2021 CLINICAL DATA:  High risk coronary artery disease. 76 year old male. EXAM: MYOCARDIAL IMAGING WITH SPECT (REST AND PHARMACOLOGIC-STRESS) GATED LEFT VENTRICULAR WALL MOTION STUDY LEFT VENTRICULAR EJECTION FRACTION TECHNIQUE: Standard myocardial SPECT imaging was performed after  resting intravenous injection of 10 mCi Tc-27m tetrofosmin. Subsequently, intravenous infusion of Lexiscan was performed under the supervision of the Cardiology staff. At peak effect of the drug, 30 mCi Tc-29m tetrofosmin was injected intravenously and standard myocardial SPECT imaging was performed. Quantitative gated imaging was also performed to evaluate left ventricular wall motion, and estimate left ventricular ejection fraction. COMPARISON:  None Available. FINDINGS: Perfusion: Small focus moderate decreased counts within the apical segment the anterior septal wall which improves from stress to rest. Larger more severe defect involving the apical, mid and basilar segment of the inferior and inferolateral wall which is fixed on rest and stress. Wall Motion: No focal wall motion abnormality. LEFT ventricular dilatation Left Ventricular Ejection Fraction: 42 % End diastolic volume [id number] ml End systolic volume [id number] ml IMPRESSION: 1. Small focus of reversible ischemia in the apical segment anterior septal wall. 2. Larger fixed defect in the inferolateral wall consistent with scar. 3. Normal left ventricular wall motion.  LEFT ventricular dilatation 4. Left ventricular ejection fraction 42% 5. Non invasive risk stratification*: Intermediate *2012 Appropriate Use Criteria for Coronary Revascularization Focused Update: J Am Coll Cardiol. N6492421. http://content.airportbarriers.com.aspx?articleid=1201161 These results will be called to the ordering clinician or representative by the Radiologist Assistant, and communication documented in the PACS or [employer]. Electronically Signed   By: Suzy Bouchard M.D.   On: 12/11/2021 11:36   ECHOCARDIOGRAM COMPLETE  Result Date: 12/09/2021    ECHOCARDIOGRAM REPORT   Patient Name:   Troy [name] Date of Exam: 12/09/2021 Medical Rec #:  [id number]      Height:       73.5 in Accession #:    [id number]     Weight:       237.6 lb Date of Birth:  09/25/1945       BSA:          2.327 m Patient Age:    25 years       BP:           101/55 mmHg Patient Gender: M              HR:           63 bpm. Exam Location:  Inpatient Procedure: 2D Echo, Color Doppler and Cardiac Doppler Indications:    R55 Syncope  History:        Patient has no prior history of Echocardiogram examinations.                 Risk  Factors:Dyslipidemia.  Sonographer:    Johny Chess RDCS Referring Phys: [id number] [place]  1. Significant hypokinesis of the anteroseptal/inferoseptal/anterior wall from base to apex. Left ventricular ejection fraction, by estimation, is 40 to 45%. The left ventricle has mildly decreased function. There is mild left ventricular hypertrophy. Left ventricular diastolic parameters are consistent with Grade I diastolic dysfunction (impaired relaxation).  2. Right ventricular systolic function is mildly reduced. The right ventricular size is normal. Tricuspid regurgitation signal is inadequate for assessing PA pressure.  3. The mitral valve is grossly normal. Trivial mitral valve regurgitation.  4. There is moderate calcification of the aortic valve. Aortic valve regurgitation is mild. No aortic stenosis is present.  5. There is mild dilatation of the ascending aorta, measuring 39 mm. There is mild dilatation of the aortic root, measuring 39 mm.  6. IVC not well visualized. Comparison(s): No prior Echocardiogram. Conclusion(s)/Recommendation(s): Echo suggestive of likely old LAD infarct. FINDINGS  Left Ventricle: Significant hypokinesis of the anteroseptal/inferoseptal/anterior wall from base to apex. Left ventricular ejection fraction, by estimation, is 40 to 45%. The left ventricle has mildly decreased function. The left ventricular internal cavity size was normal in size. There is mild left ventricular hypertrophy. Left ventricular diastolic parameters are consistent with Grade I diastolic dysfunction (impaired relaxation). Right Ventricle: The right ventricular  size is normal. No increase in right ventricular wall thickness. Right ventricular systolic function is mildly reduced. Tricuspid regurgitation signal is inadequate for assessing PA pressure. Left Atrium: Left atrial size was normal in size. Right Atrium: Right atrial size was normal in size. Pericardium: There is no evidence of pericardial effusion. Mitral Valve: The mitral valve is grossly normal. Trivial mitral valve regurgitation. Tricuspid Valve: The tricuspid valve is normal in structure. Tricuspid valve regurgitation is not demonstrated. Aortic Valve: There is moderate calcification of the aortic valve. Aortic valve regurgitation is mild. Aortic regurgitation PHT measures 719 msec. No aortic stenosis is present. Pulmonic Valve: The pulmonic valve was normal in structure. Pulmonic valve regurgitation is trivial. Aorta: There is mild dilatation of the ascending aorta, measuring 39 mm. There is mild dilatation of the aortic root, measuring 39 mm. Venous: IVC not well visualized. IAS/Shunts: The interatrial septum was not well visualized.  LEFT VENTRICLE PLAX 2D LVIDd:         5.50 cm      Diastology LVIDs:         4.40 cm      LV e' medial:    4.79 cm/s LV PW:         1.10 cm      LV E/e' medial:  10.4 LV IVS:        1.10 cm      LV e' lateral:   6.31 cm/s LVOT diam:     2.10 cm      LV E/e' lateral: 7.9 LV SV:         74 LV SV Index:   32 LVOT Area:     3.46 cm  LV Volumes (MOD) LV vol d, MOD A2C: 143.0 ml LV vol d, MOD A4C: 132.0 ml LV vol s, MOD A2C: 74.6 ml LV vol s, MOD A4C: 81.9 ml LV SV MOD A2C:     68.4 ml LV SV MOD A4C:     132.0 ml LV SV MOD BP:      62.4 ml RIGHT VENTRICLE RV Basal diam:  2.50 cm RV S prime:     8.27 cm/s TAPSE (M-mode): 1.1 cm  LEFT ATRIUM             Index        RIGHT ATRIUM           Index LA diam:        3.50 cm 1.50 cm/m   RA Area:     15.50 cm LA Vol (A2C):   53.3 ml 22.91 ml/m  RA Volume:   42.00 ml  18.05 ml/m LA Vol (A4C):   53.5 ml 22.99 ml/m LA Biplane Vol: 54.9 ml  23.59 ml/m  AORTIC VALVE LVOT Vmax:   94.60 cm/s LVOT Vmean:  59.000 cm/s LVOT VTI:    0.213 m AI PHT:      719 msec  AORTA Ao Root diam: 3.90 cm Ao Asc diam:  3.90 cm MITRAL VALVE MV Area (PHT): 2.32 cm    SHUNTS MV Decel Time: 327 msec    Systemic VTI:  0.21 m MV E velocity: 49.90 cm/s  Systemic Diam: 2.10 cm MV A velocity: 85.20 cm/s MV E/A ratio:  0.59 Mary Scientist, physiological signed by Phineas Inches Signature Date/Time: 12/09/2021/12:43:28 PM    Final    DG Chest 1 View  Result Date: 12/08/2021 CLINICAL DATA:  chest pain EXAM: CHEST  1 VIEW COMPARISON:  November 04, 2019 FINDINGS: The heart size and mediastinal contours are within normal limits. Stable ectatic thoracic aorta. Both lungs are clear and stable. The visualized skeletal structures are unremarkable. IMPRESSION: No active disease. Electronically Signed   By: Frazier Richards M.D.   On: 12/08/2021 17:03      Subjective: Patient seen and examined at the bedside this morning.  Hemodynamically stable for discharge.  Discharge planning discussed with wife at bedside  Discharge Exam: Vitals:   12/13/21 0527 12/13/21 1012  BP: 131/75 117/71  Pulse: 66 67  Resp: 18   Temp: 98.3 F (36.8 C) 98.4 F (36.9 C)  SpO2: 95% 100%   Vitals:   12/12/21 1950 12/13/21 0527 12/13/21 0555 12/13/21 1012  BP: (!) 141/77 131/75  117/71  Pulse: 77 66  67  Resp: 18 18    Temp: 97.6 F (36.4 C) 98.3 F (36.8 C)  98.4 F (36.9 C)  TempSrc: Oral Oral  Oral  SpO2: 97% 95%  100%  Weight:   109 kg   Height:        General: Pt is alert, awake, not in acute distress Cardiovascular: RRR, S1/S2 +, no rubs, no gallops Respiratory: CTA bilaterally, no wheezing, no rhonchi Abdominal: Soft, NT, ND, bowel sounds + Extremities: no edema, no cyanosis    The results of significant diagnostics from this hospitalization (including imaging, microbiology, ancillary and laboratory) are listed below for reference.     Microbiology: No results found for this  or any previous visit (from the past 240 hour(s)).   Labs: BNP (last 3 results) Recent Labs    12/08/21 1545  BNP 123456*   Basic Metabolic Panel: Recent Labs  Lab 12/08/21 1545 12/09/21 0316 12/10/21 0407 12/12/21 1836 12/13/21 0257  NA 139 138 140  --  138  K 4.6 4.1 3.7  --  3.9  CL 107 107 105  --  109  CO2 26 24 29   --  25  GLUCOSE 124* 118* 94  --  104*  BUN 19 21 21   --  19  CREATININE 1.29* 1.23 1.28* 1.09 1.06  CALCIUM 8.6* 8.6* 8.6*  --  8.3*  MG  --   --  2.0  --   --  Liver Function Tests: Recent Labs  Lab 12/08/21 1545  AST 31  ALT 23  ALKPHOS 53  BILITOT 0.5  PROT 5.5*  ALBUMIN 3.6   No results for input(s): "LIPASE", "AMYLASE" in the last 168 hours. No results for input(s): "AMMONIA" in the last 168 hours. CBC: Recent Labs  Lab 12/08/21 1545 12/12/21 1836 12/13/21 0257  WBC 4.3 5.0 5.8  NEUTROABS 2.7  --   --   HGB 14.8 14.0 12.5*  HCT 43.6 40.2 37.4*  MCV 92.0 91.4 93.0  PLT 209 182 226   Cardiac Enzymes: No results for input(s): "CKTOTAL", "CKMB", "CKMBINDEX", "TROPONINI" in the last 168 hours. BNP: Invalid input(s): "POCBNP" CBG: Recent Labs  Lab 12/11/21 0556 12/12/21 0557 12/13/21 0535 12/13/21 0808 12/13/21 1146  GLUCAP 106* 105* 101* 151* 86   D-Dimer No results for input(s): "DDIMER" in the last 72 hours. Hgb A1c No results for input(s): "HGBA1C" in the last 72 hours. Lipid Profile No results for input(s): "CHOL", "HDL", "LDLCALC", "TRIG", "CHOLHDL", "LDLDIRECT" in the last 72 hours. Thyroid function studies No results for input(s): "TSH", "T4TOTAL", "T3FREE", "THYROIDAB" in the last 72 hours.  Invalid input(s): "FREET3" Anemia work up No results for input(s): "VITAMINB12", "FOLATE", "FERRITIN", "TIBC", "IRON", "RETICCTPCT" in the last 72 hours. Urinalysis    Component Value Date/Time   BILIRUBINUR Negative 06/20/2020 1235   PROTEINUR Negative 06/20/2020 1235   UROBILINOGEN 0.2 06/20/2020 1235   NITRITE  Negative 06/20/2020 1235   LEUKOCYTESUR Negative 06/20/2020 1235   Sepsis Labs Recent Labs  Lab 12/08/21 1545 12/12/21 1836 12/13/21 0257  WBC 4.3 5.0 5.8   Microbiology No results found for this or any previous visit (from the past 240 hour(s)).  Please note: You were cared for by a hospitalist during your hospital stay. Once you are discharged, your primary care physician will handle any further medical issues. Please note that NO REFILLS for any discharge medications will be authorized once you are discharged, as it is imperative that you return to your primary care physician (or establish a relationship with a primary care physician if you do not have one) for your post hospital discharge needs so that they can reassess your need for medications and monitor your lab values.    Time coordinating discharge: 40 minutes  SIGNED:   Shelly Coss, MD  [facility] 12/13/2021, 12:27 PM Pager [phone]  If 7PM-7AM, please contact night-coverage [email or web address] Password [email or web address]

## 2021-12-14 ENCOUNTER — Telehealth: Payer: Self-pay

## 2021-12-14 LAB — LIPOPROTEIN A (LPA): Lipoprotein (a): 20 nmol/L (ref <75.0)

## 2021-12-14 NOTE — Chronic Care Management (AMB) (Addendum)
Chronic Care Management Pharmacy Assistant   Name: Troy [name]  MRN: [id number] DOB: [birth year] .  Reason for Encounter: Hospital Follow Up  Non-CCM     Medications: Outpatient Encounter Medications as of 12/14/2021  Medication Sig   aspirin 81 MG chewable tablet Chew 1 tablet (81 mg total) by mouth daily.   baricitinib (OLUMIANT) 2 MG TABS tablet Take 2 mg by mouth daily.   clopidogrel (PLAVIX) 75 MG tablet Take 1 tablet (75 mg total) by mouth daily with breakfast.   donepezil (ARICEPT) 5 MG tablet TAKE ONE TABLET BY MOUTH DAILY FOR MEMORY   Elderberry 575 MG/5ML SYRP Take 5 mLs by mouth daily.   folic acid (FOLVITE) 1 MG tablet Take 1 mg by mouth daily.   memantine (NAMENDA) 10 MG tablet Take 1 tablet (10 mg total) by mouth 2 (two) times daily.   methotrexate (RHEUMATREX) 2.5 MG tablet Take 15 mg by mouth once a week. Take 6 tablets (15 mg) on Saturdays   mirabegron ER (MYRBETRIQ) 25 MG TB24 tablet Take 25 mg by mouth daily.   rosuvastatin (CRESTOR) 10 MG tablet Take 1 tablet (10 mg total) by mouth daily.   No facility-administered encounter medications on file as of 12/14/2021.     Reviewed hospital notes for details of recent visit. Patient has been contacted by Transitions of Care team: Yes  Admitted to the ED on 12/08/21. Discharge date was 12/13/21.  Discharged from [facility].   Discharge diagnosis (Principal Problem): syncope and collapse Patient was discharged to Home  Presented with syncope.  As per the report, patient was sitting on the bed of his truck outside in the shade watching a friend fix his mower.  He later felt dizzy and had witnessed loss of consciousness by his friend.  Wife then came out and saw him vomiting with mucus coming of his nose.  On condition he was bradycardic to 50s, normotensive.  Labs showed mild AKI with creatinine of 1.2.  Chest x-ray did not show any pneumonia or other findings.  EKG showed left bundle branch block which  is not a new finding.  Patient admitted for syncopal management.  Echo showed diminished left ventricular function, positive nuclear stress test.  Underwent cardiac catheter that showed significant coronary artery disease, status post placement of 2 DES.  Started on aspirin, Plavix.  Medically stable for discharge home today.  He will follow-up with cardiology as an outpatient.   Following problems were addressed during his hospitalization:   Syncope: Events as above.  Currently he is hemodynamically stable.   EKG shows left bundle branch block which is not a new finding.  Echo as below.  Orthostatic vitals were found to be negative .PT/OT evaluation done, no follow-up recommended   Systolic congestive heart failure: New onset.  Currently euvolemic.  No clinical signs of heart failure.  Echo done here showed EF of 40 to 45%, anterior wall hypokinesia.  Troponin is still normal.  No chest pain.He has mildly elevated BNP.  Cardiology consulted.Underwent nuclear stress test which was positive for reversible ischemia.  Most likely ischemic cardiomyopathy because of finding of significant coronary disease.  He will follow-up with cardiology as an outpatient   Coronary artery disease: Underwent cardiac cath with finding of 95% stenosis from mid LAD to distal LAD and also proximal LAD to mid LAD.  Status post 2 DES placement.  Continue aspirin, Plavix, statin   AKI: Resolved with IV fluids   Dementia:  Alert and oriented to place but not to time.  Continue supportive care.  Delirium precautions.  Continue Aricept, Namenda   Rheumatoid arthritis: On methotrexate.  Also on clinical trial with baricitinib  New?Medications Started at [facility] Discharge:?? -Started aspirin  Plaix  Medication Changes at Hospital Discharge: -Changed rosuvastatin   Medications Discontinued at Hospital Discharge: -Stopped Nystatin powder  Clotrimazole  ciclopirox  Medications that remain the same after Hospital  Discharge:??  -All other medications will remain the same.    Next CCM appt: none  Other upcoming appts: PCP appointment on 12/18/21  Al Corpus, PharmD notified and will determine if action is needed.   Al Corpus, CPP notified  Burt Knack, [facility] concierge  [phone]

## 2021-12-14 NOTE — Telephone Encounter (Signed)
Transition Care Management Follow-up Telephone Call Date of discharge and from where: 8/3, [facility] How have you been since you were released from the hospital? Doing well, slept well last night Any questions or concerns? No  Items Reviewed: Did the pt receive and understand the discharge instructions provided? Yes  Medications obtained and verified? Yes  Other? Yes  Any new allergies since your discharge? No  Dietary orders reviewed? Yes, heart health low sodium diet Do you have support at home? Yes , wife, [facility] and Equipment/Supplies: Were home health services ordered? yes If so, what is the name of the agency? unsure  Has the agency set up a time to come to the patient's home? no Were any new equipment or medical supplies ordered?  No What is the name of the medical supply agency? N/a Were you able to get the supplies/equipment? not applicable Do you have any questions related to the use of the equipment or supplies? No  Functional Questionnaire: (I = Independent and D = Dependent) ADLs: I  Bathing/Dressing- I  Meal Prep- D  Eating- I  Maintaining continence- I  Transferring/Ambulation- I  Managing Meds- D  Follow up appointments reviewed:  PCP Hospital f/u appt confirmed? Yes  Scheduled to see Dr. Milinda Antis on 8/8 @ 11:30. Specialist Hospital f/u appt confirmed? No  ; but pt's wife is aware pt should have f/u with cardiology within 2 weeks. Are transportation arrangements needed? No  If their condition worsens, is the pt aware to call PCP or go to the Emergency Dept.? Yes Was the patient provided with contact information for the PCP's office or ED? Yes Was to pt encouraged to call back with questions or concerns? Yes  Interview was conducted with pt's wife, Troy [name].

## 2021-12-18 ENCOUNTER — Encounter: Payer: Self-pay | Admitting: Family Medicine

## 2021-12-18 ENCOUNTER — Ambulatory Visit (INDEPENDENT_AMBULATORY_CARE_PROVIDER_SITE_OTHER): Payer: PPO | Admitting: Family Medicine

## 2021-12-18 VITALS — BP 102/68 | HR 62 | Ht 73.5 in | Wt 240.0 lb

## 2021-12-18 DIAGNOSIS — F03B0 Unspecified dementia, moderate, without behavioral disturbance, psychotic disturbance, mood disturbance, and anxiety: Secondary | ICD-10-CM

## 2021-12-18 DIAGNOSIS — I2510 Atherosclerotic heart disease of native coronary artery without angina pectoris: Secondary | ICD-10-CM | POA: Insufficient documentation

## 2021-12-18 DIAGNOSIS — E782 Mixed hyperlipidemia: Secondary | ICD-10-CM | POA: Diagnosis not present

## 2021-12-18 DIAGNOSIS — R55 Syncope and collapse: Secondary | ICD-10-CM | POA: Diagnosis not present

## 2021-12-18 DIAGNOSIS — N179 Acute kidney failure, unspecified: Secondary | ICD-10-CM

## 2021-12-18 DIAGNOSIS — I5022 Chronic systolic (congestive) heart failure: Secondary | ICD-10-CM | POA: Diagnosis not present

## 2021-12-18 NOTE — Patient Instructions (Addendum)
For cholesterol Avoid red meat/ fried foods/ egg yolks/ fatty breakfast meats/ butter, cheese and high fat dairy/ and shellfish   Continue current medicines    Watch for dizziness or chest pain  Be careful in the heat  Stay hydrated !   Follow up with cardiology as planned   Schedule fasting lab here in 6 weeks

## 2021-12-18 NOTE — Assessment & Plan Note (Signed)
Was dehydrated Reviewed hospital records, lab results and studies in detail  Improved with hydration

## 2021-12-18 NOTE — Assessment & Plan Note (Signed)
Disc goals for lipids and reasons to control them Rev last labs with pt Rev low sat fat diet in detail New LDL goal is below 70 for recent dx of CAD Reviewed hospital records, lab results and studies in detail   Taking crestor 10 mg (from 5)  Re check lipids in 6 wk  Diet is fair/reviewed

## 2021-12-18 NOTE — Assessment & Plan Note (Addendum)
Recent hosp/dx with CAD  Echo EF 40-45% with ant wall hypokinesia  Mildly elevated BNP For outpt cardiology  No sob or cp

## 2021-12-18 NOTE — Progress Notes (Signed)
Subjective:    Patient ID: Troy [name], male    DOB: 1945-07-20, 76 y.o.   MRN: [id number]  HPI Pt presents for follow up of hospitalization for CAD  Wt Readings from Last 3 Encounters:  12/18/21 240 lb (108.9 kg)  12/13/21 240 lb 3.2 oz (109 kg)  09/26/21 245 lb 6 oz (111.3 kg)   31.23 kg/m    Hosp from 7/29 to 12/13/21 Presented with syncope (sitting on truck bed, passed out ten had vomiting) EKG showed LBBB Echo= dec LV function  Cath-sig CAD - pladed 2 DES and started asa and plavix  For outpt cardiology f/u   [place] course: Following problems were addressed during his hospitalization:   Syncope: Events as above.  Currently he is hemodynamically stable.   EKG shows left bundle branch block which is not a new finding.  Echo as below.  Orthostatic vitals were found to be negative .PT/OT evaluation done, no follow-up recommended   Systolic congestive heart failure: New onset.  Currently euvolemic.  No clinical signs of heart failure.  Echo done here showed EF of 40 to 45%, anterior wall hypokinesia.  Troponin is still normal.  No chest pain.He has mildly elevated BNP.  Cardiology consulted.Underwent nuclear stress test which was positive for reversible ischemia.  Most likely ischemic cardiomyopathy because of finding of significant coronary disease.  He will follow-up with cardiology as an outpatient   Per wife never really had symptoms from this  He had to sit occ when shopping-thought due to pain    Coronary artery disease: Underwent cardiac cath with finding of 95% stenosis from mid LAD to distal LAD and also proximal LAD to mid LAD.  Status post 2 DES placement.  Continue aspirin, Plavix, statin  Cardiology appt is next week  Still has a patch on his incision  Very bruised but not bothered that much     AKI: Resolved with IV fluids  Lab Results  Component Value Date   CREATININE 1.06 12/13/2021   BUN 19 12/13/2021   NA 138 12/13/2021   K 3.9 12/13/2021   CL 109  12/13/2021   CO2 25 12/13/2021      Dementia: Alert and oriented to place but not to time.  Continue supportive care.  Delirium precautions.  Continue Aricept, Namenda   Now taking effexor 37.5 mg for mood Also trazodone for sundowning/sleep prn   Rheumatoid arthritis: On methotrexate.  Also on clinical trial with baricitinib   Lab Results  Component Value Date   CREATININE 1.06 12/13/2021   BUN 19 12/13/2021   NA 138 12/13/2021   K 3.9 12/13/2021   CL 109 12/13/2021   CO2 25 12/13/2021  GFR over 60    Lab Results  Component Value Date   ALT 23 12/08/2021   AST 31 12/08/2021   ALKPHOS 53 12/08/2021   BILITOT 0.5 12/08/2021   Lab Results  Component Value Date   WBC 5.8 12/13/2021   HGB 12.5 (L) 12/13/2021   HCT 37.4 (L) 12/13/2021   MCV 93.0 12/13/2021   PLT 226 12/13/2021   Hyperlipidemia Lab Results  Component Value Date   CHOL 172 12/13/2021   HDL 51 12/13/2021   LDLCALC 113 (H) 12/13/2021   LDLDIRECT 136.9 07/19/2009   TRIG 41 12/13/2021   CHOLHDL 3.4 12/13/2021  Crestor was inc from 5 to 10 mg daily    Prediabetes Lab Results  Component Value Date   HGBA1C 5.7 01/02/2021   BP Readings from Last  3 Encounters:  12/18/21 102/68  12/13/21 117/71  09/26/21 112/60   Pulse Readings from Last 3 Encounters:  12/18/21 62  12/13/21 67  09/26/21 (!) 57   No dizziness today  Occ slt light headed and no syncope   Being careful in heat  Sits on front porch -in shade with a fan   Patient Active Problem List   Diagnosis Date Noted   CAD (coronary artery disease) 123XX123   Systolic CHF ([place]) 123456   Syncope 12/08/2021   AKI (acute kidney injury) ([place]) 12/08/2021   Ingrown nail of great toe of right foot 07/20/2021   COVID-19 02/06/2021   Medicare annual wellness visit, subsequent 01/09/2021   Tinea cruris 06/20/2020   Mesenteric lymphadenopathy 06/15/2020   Dementia ([place]) 02/11/2020   Constipation 11/23/2019   BPH (benign prostatic  hyperplasia) 11/23/2019   Microscopic hematuria 11/23/2019   Abnormal CXR 11/04/2019   Vitamin D deficiency 09/24/2019   Rheumatoid arthritis ([place]) 09/14/2019   Mild dehydration 09/14/2019   Positive ANA (antinuclear antibody) 03/21/2019   Anxiety 06/09/2015   Routine general medical examination at a health care facility 12/10/2013   Family history of colon cancer 12/10/2013   Prostate cancer screening 12/05/2013   Allergic rhinitis 10/18/2013   ROTATOR CUFF INJURY, LEFT SHOULDER 04/30/2010   Prediabetes 08/21/2009   HYPERLIPIDEMIA 04/20/2007   OSTEOARTHRITIS 02/09/2007   History reviewed. No pertinent past medical history. Past Surgical History:  Procedure Laterality Date   BACK SURGERY  04/1998   CORONARY STENT INTERVENTION N/A 12/12/2021   Procedure: CORONARY STENT INTERVENTION;  Surgeon: Leonie Man, MD;  Location: [facility];  Service: Cardiovascular;  Laterality: N/A;   LEFT HEART CATH AND CORONARY ANGIOGRAPHY N/A 12/12/2021   Procedure: LEFT HEART CATH AND CORONARY ANGIOGRAPHY;  Surgeon: [name], MD;  Location: [facility];  Service: Cardiovascular;  Laterality: N/A;   LIPOMA EXCISION  09/2009   ROTATOR CUFF REPAIR Left 2012   TONSILLECTOMY AND ADENOIDECTOMY  age 37   Social History   Tobacco Use   Smoking status: Never    Passive exposure: Past   Smokeless tobacco: Never  Vaping Use   Vaping Use: Never used  Substance Use Topics   Alcohol use: No    Comment: quit over 39 years ago   Drug use: No   Family History  Problem Relation Age of Onset   Dementia Mother    Colon cancer Father 27   Allergies  Allergen Reactions   Chlorzoxazone     REACTION: dizzy   Elemental Sulfur Rash   Current Outpatient Medications on File Prior to Visit  Medication Sig Dispense Refill   aspirin 81 MG chewable tablet Chew 1 tablet (81 mg total) by mouth daily. 30 tablet 1   baricitinib (OLUMIANT) 2 MG TABS tablet Take 2 mg by mouth daily.     clopidogrel  (PLAVIX) 75 MG tablet Take 1 tablet (75 mg total) by mouth daily with breakfast. 30 tablet 1   donepezil (ARICEPT) 5 MG tablet TAKE ONE TABLET BY MOUTH DAILY FOR MEMORY     Elderberry 575 MG/5ML SYRP Take 5 mLs by mouth daily.     folic acid (FOLVITE) 1 MG tablet Take 1 mg by mouth daily.     memantine (NAMENDA) 10 MG tablet Take 1 tablet (10 mg total) by mouth 2 (two) times daily. 60 tablet 5   methotrexate (RHEUMATREX) 2.5 MG tablet Take 15 mg by mouth once a week. Take 6 tablets (15  mg) on Saturdays     mirabegron ER (MYRBETRIQ) 25 MG TB24 tablet Take 25 mg by mouth daily.     rosuvastatin (CRESTOR) 10 MG tablet Take 1 tablet (10 mg total) by mouth daily. 30 tablet 1   traZODone (DESYREL) 50 MG tablet Take 50 mg by mouth at bedtime.     venlafaxine (EFFEXOR) 37.5 MG tablet Take 37.5 mg by mouth as needed.     No current facility-administered medications on file prior to visit.    Review of Systems  Constitutional:  Negative for activity change, appetite change, fatigue, fever and unexpected weight change.  HENT:  Negative for congestion, rhinorrhea, sore throat and trouble swallowing.   Eyes:  Negative for pain, redness, itching and visual disturbance.  Respiratory:  Negative for cough, chest tightness, shortness of breath and wheezing.   Cardiovascular:  Negative for chest pain and palpitations.  Gastrointestinal:  Negative for abdominal pain, blood in stool, constipation, diarrhea and nausea.  Endocrine: Negative for cold intolerance, heat intolerance, polydipsia and polyuria.  Genitourinary:  Negative for difficulty urinating, dysuria, frequency and urgency.  Musculoskeletal:  Negative for arthralgias, joint swelling and myalgias.  Skin:  Negative for pallor and rash.  Neurological:  Positive for light-headedness. Negative for dizziness, tremors, weakness, numbness and headaches.  Hematological:  Negative for adenopathy. Does not bruise/bleed easily.  Psychiatric/Behavioral:  Negative  for decreased concentration and dysphoric mood. The patient is not nervous/anxious.        Objective:   Physical Exam Constitutional:      General: He is not in acute distress.    Appearance: Normal appearance. He is well-developed. He is obese. He is not ill-appearing or diaphoretic.  HENT:     Head: Normocephalic and atraumatic.  Eyes:     Conjunctiva/sclera: Conjunctivae normal.     Pupils: Pupils are equal, round, and reactive to light.  Neck:     Thyroid: No thyromegaly.     Vascular: No carotid bruit or JVD.  Cardiovascular:     Rate and Rhythm: Normal rate and regular rhythm.     Heart sounds: Normal heart sounds.     No gallop.  Pulmonary:     Effort: Pulmonary effort is normal. No respiratory distress.     Breath sounds: Normal breath sounds. No wheezing or rales.  Abdominal:     General: There is no distension or abdominal bruit.     Palpations: Abdomen is soft. There is no mass.     Tenderness: There is no abdominal tenderness.  Musculoskeletal:     Cervical back: Normal range of motion and neck supple.     Right lower leg: No edema.     Left lower leg: No edema.  Lymphadenopathy:     Cervical: No cervical adenopathy.  Skin:    General: Skin is warm and dry.     Coloration: Skin is not jaundiced or pale.     Findings: Bruising present. No lesion or rash.     Comments: Old ecchymosis at cath site in R groin as expected   Neurological:     Mental Status: He is alert.     Coordination: Coordination normal.     Deep Tendon Reflexes: Reflexes are normal and symmetric. Reflexes normal.  Psychiatric:        Mood and Affect: Mood normal.           Assessment & Plan:   Problem List Items Addressed This Visit       Cardiovascular and Mediastinum  CAD (coronary artery disease)    Stenosis from mid to distal LAD Reviewed hospital records, lab results and studies in detail  S/p 2 DES placement  Clinically stable  On asa /plavix/statin  Planning  cardiology f/u      Systolic CHF [facility])    Recent hosp/dx with CAD  Echo EF 40-45% with ant wall hypokinesia  Mildly elevated BNP For outpt cardiology  No sob or cp         Nervous and Auditory   Dementia (HCC)    Some agitation during recent hospitalization for syncope/CAD Was px effexor and trazodone to keep on hand So far has not needed at home Plan f/u with neurology      Relevant Medications   traZODone (DESYREL) 50 MG tablet   venlafaxine (EFFEXOR) 37.5 MG tablet     Genitourinary   AKI (acute kidney injury) (HCC)    Was dehydrated Reviewed hospital records, lab results and studies in detail  Improved with hydration         Other   HYPERLIPIDEMIA    Disc goals for lipids and reasons to control them Rev last labs with pt Rev low sat fat diet in detail New LDL goal is below 70 for recent dx of CAD Reviewed hospital records, lab results and studies in detail   Taking crestor 10 mg (from 5)  Re check lipids in 6 wk  Diet is fair/reviewed       Syncope - Primary    One episode, hosp for likely due to dehydration and heat exp  Also incidentally dx with chf and CAD Reviewed hospital records, lab results and studies in detail   Now well hydrated/clinically stable Limiting outdoor time  occ light headed, disc change pos slowly  Baseline bp is low normal  Enc good fluid intake  Alert if the re occurs

## 2021-12-18 NOTE — Assessment & Plan Note (Addendum)
Stenosis from mid to distal LAD Reviewed hospital records, lab results and studies in detail  S/p 2 DES placement  Clinically stable  On asa /plavix/statin  Planning cardiology f/u

## 2021-12-18 NOTE — Assessment & Plan Note (Signed)
One episode, hosp for likely due to dehydration and heat exp  Also incidentally dx with chf and CAD Reviewed hospital records, lab results and studies in detail   Now well hydrated/clinically stable Limiting outdoor time  occ light headed, disc change pos slowly  Baseline bp is low normal  Enc good fluid intake  Alert if the re occurs

## 2021-12-18 NOTE — Assessment & Plan Note (Signed)
Some agitation during recent hospitalization for syncope/CAD Was px effexor and trazodone to keep on hand So far has not needed at home Plan f/u with neurology

## 2021-12-21 ENCOUNTER — Telehealth (HOSPITAL_COMMUNITY): Payer: Self-pay

## 2021-12-21 NOTE — Telephone Encounter (Signed)
Received a white card for pt to participate in cardiac rehab, pt see Dr. Algie Coffer for is cardiology services. Faxed over a cardiac rehab order form to Dr. Roseanne Kaufman office. Per pt's wife Troy [name], pt is interested in the cardiac rehab program, will f/u to schedule after we have received documentation.

## 2021-12-26 DIAGNOSIS — Z9861 Coronary angioplasty status: Secondary | ICD-10-CM | POA: Diagnosis not present

## 2021-12-26 DIAGNOSIS — I2510 Atherosclerotic heart disease of native coronary artery without angina pectoris: Secondary | ICD-10-CM | POA: Diagnosis not present

## 2021-12-26 DIAGNOSIS — E7849 Other hyperlipidemia: Secondary | ICD-10-CM | POA: Diagnosis not present

## 2022-01-01 DIAGNOSIS — M1990 Unspecified osteoarthritis, unspecified site: Secondary | ICD-10-CM | POA: Diagnosis not present

## 2022-01-01 DIAGNOSIS — M0609 Rheumatoid arthritis without rheumatoid factor, multiple sites: Secondary | ICD-10-CM | POA: Diagnosis not present

## 2022-01-01 DIAGNOSIS — Z79899 Other long term (current) drug therapy: Secondary | ICD-10-CM | POA: Diagnosis not present

## 2022-01-01 DIAGNOSIS — G3184 Mild cognitive impairment, so stated: Secondary | ICD-10-CM | POA: Diagnosis not present

## 2022-01-01 DIAGNOSIS — R768 Other specified abnormal immunological findings in serum: Secondary | ICD-10-CM | POA: Diagnosis not present

## 2022-01-01 DIAGNOSIS — Z13820 Encounter for screening for osteoporosis: Secondary | ICD-10-CM | POA: Diagnosis not present

## 2022-01-01 DIAGNOSIS — M25511 Pain in right shoulder: Secondary | ICD-10-CM | POA: Diagnosis not present

## 2022-01-01 DIAGNOSIS — M79646 Pain in unspecified finger(s): Secondary | ICD-10-CM | POA: Diagnosis not present

## 2022-01-03 DIAGNOSIS — F419 Anxiety disorder, unspecified: Secondary | ICD-10-CM | POA: Diagnosis not present

## 2022-01-03 DIAGNOSIS — E663 Overweight: Secondary | ICD-10-CM | POA: Diagnosis not present

## 2022-01-03 DIAGNOSIS — D8481 Immunodeficiency due to conditions classified elsewhere: Secondary | ICD-10-CM | POA: Diagnosis not present

## 2022-01-03 DIAGNOSIS — G4700 Insomnia, unspecified: Secondary | ICD-10-CM | POA: Diagnosis not present

## 2022-01-03 DIAGNOSIS — F0390 Unspecified dementia without behavioral disturbance: Secondary | ICD-10-CM | POA: Diagnosis not present

## 2022-01-03 DIAGNOSIS — D84821 Immunodeficiency due to drugs: Secondary | ICD-10-CM | POA: Diagnosis not present

## 2022-01-03 DIAGNOSIS — I252 Old myocardial infarction: Secondary | ICD-10-CM | POA: Diagnosis not present

## 2022-01-03 DIAGNOSIS — F3342 Major depressive disorder, recurrent, in full remission: Secondary | ICD-10-CM | POA: Diagnosis not present

## 2022-01-03 DIAGNOSIS — M0550 Rheumatoid polyneuropathy with rheumatoid arthritis of unspecified site: Secondary | ICD-10-CM | POA: Diagnosis not present

## 2022-01-03 DIAGNOSIS — E785 Hyperlipidemia, unspecified: Secondary | ICD-10-CM | POA: Diagnosis not present

## 2022-01-03 DIAGNOSIS — G8929 Other chronic pain: Secondary | ICD-10-CM | POA: Diagnosis not present

## 2022-01-03 DIAGNOSIS — I2510 Atherosclerotic heart disease of native coronary artery without angina pectoris: Secondary | ICD-10-CM | POA: Diagnosis not present

## 2022-01-08 ENCOUNTER — Other Ambulatory Visit (HOSPITAL_COMMUNITY): Payer: Self-pay

## 2022-01-09 ENCOUNTER — Other Ambulatory Visit (HOSPITAL_COMMUNITY): Payer: Self-pay

## 2022-01-10 ENCOUNTER — Ambulatory Visit (INDEPENDENT_AMBULATORY_CARE_PROVIDER_SITE_OTHER): Payer: PPO | Admitting: *Deleted

## 2022-01-10 DIAGNOSIS — Z0000 Encounter for general adult medical examination without abnormal findings: Secondary | ICD-10-CM

## 2022-01-10 NOTE — Patient Instructions (Signed)
Mr. Troy [name] , Thank you for taking time to come for your Medicare Wellness Visit. I appreciate your ongoing commitment to your health goals. Please review the following plan we discussed and let me know if I can assist you in the future.   Screening recommendations/referrals: Colonoscopy: no longer required Recommended yearly ophthalmology/optometry visit for glaucoma screening and checkup Recommended yearly dental visit for hygiene and checkup  Vaccinations: Influenza vaccine: up to date Pneumococcal vaccine: up to date Tdap vaccine: up to date Shingles vaccine: Education provided    Advanced directives: Education provided      Preventive Care 65 Years and Older, Male Preventive care refers to lifestyle choices and visits with your health care provider that can promote health and wellness. What does preventive care include? A yearly physical exam. This is also called an annual well check. Dental exams once or twice a year. Routine eye exams. Ask your health care provider how often you should have your eyes checked. Personal lifestyle choices, including: Daily care of your teeth and gums. Regular physical activity. Eating a healthy diet. Avoiding tobacco and drug use. Limiting alcohol use. Practicing safe sex. Taking low doses of aspirin every day. Taking vitamin and mineral supplements as recommended by your health care provider. What happens during an annual well check? The services and screenings done by your health care provider during your annual well check will depend on your age, overall health, lifestyle risk factors, and family history of disease. Counseling  Your health care provider may ask you questions about your: Alcohol use. Tobacco use. Drug use. Emotional well-being. Home and relationship well-being. Sexual activity. Eating habits. History of falls. Memory and ability to understand (cognition). Work and work Astronomer. Screening  You may have the  following tests or measurements: Height, weight, and BMI. Blood pressure. Lipid and cholesterol levels. These may be checked every 5 years, or more frequently if you are over 61 years old. Skin check. Lung cancer screening. You may have this screening every year starting at age 32 if you have a 30-pack-year history of smoking and currently smoke or have quit within the past 15 years. Fecal occult blood test (FOBT) of the stool. You may have this test every year starting at age 34. Flexible sigmoidoscopy or colonoscopy. You may have a sigmoidoscopy every 5 years or a colonoscopy every 10 years starting at age 104. Prostate cancer screening. Recommendations will vary depending on your family history and other risks. Hepatitis C blood test. Hepatitis B blood test. Sexually transmitted disease (STD) testing. Diabetes screening. This is done by checking your blood sugar (glucose) after you have not eaten for a while (fasting). You may have this done every 1-3 years. Abdominal aortic aneurysm (AAA) screening. You may need this if you are a current or former smoker. Osteoporosis. You may be screened starting at age 64 if you are at high risk. Talk with your health care provider about your test results, treatment options, and if necessary, the need for more tests. Vaccines  Your health care provider may recommend certain vaccines, such as: Influenza vaccine. This is recommended every year. Tetanus, diphtheria, and acellular pertussis (Tdap, Td) vaccine. You may need a Td booster every 10 years. Zoster vaccine. You may need this after age 47. Pneumococcal 13-valent conjugate (PCV13) vaccine. One dose is recommended after age 84. Pneumococcal polysaccharide (PPSV23) vaccine. One dose is recommended after age 56. Talk to your health care provider about which screenings and vaccines you need and how often you need  them. This information is not intended to replace advice given to you by your health care  provider. Make sure you discuss any questions you have with your health care provider. Document Released: 05/26/2015 Document Revised: 01/17/2016 Document Reviewed: 02/28/2015 Elsevier Interactive Patient Education  2017 [employer].  Fall Prevention in the Home Falls can cause injuries. They can happen to people of all ages. There are many things you can do to make your home safe and to help prevent falls. What can I do on the outside of my home? Regularly fix the edges of walkways and driveways and fix any cracks. Remove anything that might make you trip as you walk through a door, such as a raised step or threshold. Trim any bushes or trees on the path to your home. Use bright outdoor lighting. Clear any walking paths of anything that might make someone trip, such as rocks or tools. Regularly check to see if handrails are loose or broken. Make sure that both sides of any steps have handrails. Any raised decks and porches should have guardrails on the edges. Have any leaves, snow, or ice cleared regularly. Use sand or salt on walking paths during winter. Clean up any spills in your garage right away. This includes oil or grease spills. What can I do in the bathroom? Use night lights. Install grab bars by the toilet and in the tub and shower. Do not use towel bars as grab bars. Use non-skid mats or decals in the tub or shower. If you need to sit down in the shower, use a plastic, non-slip stool. Keep the floor dry. Clean up any water that spills on the floor as soon as it happens. Remove soap buildup in the tub or shower regularly. Attach bath mats securely with double-sided non-slip rug tape. Do not have throw rugs and other things on the floor that can make you trip. What can I do in the bedroom? Use night lights. Make sure that you have a light by your bed that is easy to reach. Do not use any sheets or blankets that are too big for your bed. They should not hang down onto the  floor. Have a firm chair that has side arms. You can use this for support while you get dressed. Do not have throw rugs and other things on the floor that can make you trip. What can I do in the kitchen? Clean up any spills right away. Avoid walking on wet floors. Keep items that you use a lot in easy-to-reach places. If you need to reach something above you, use a strong step stool that has a grab bar. Keep electrical cords out of the way. Do not use floor polish or wax that makes floors slippery. If you must use wax, use non-skid floor wax. Do not have throw rugs and other things on the floor that can make you trip. What can I do with my stairs? Do not leave any items on the stairs. Make sure that there are handrails on both sides of the stairs and use them. Fix handrails that are broken or loose. Make sure that handrails are as long as the stairways. Check any carpeting to make sure that it is firmly attached to the stairs. Fix any carpet that is loose or worn. Avoid having throw rugs at the top or bottom of the stairs. If you do have throw rugs, attach them to the floor with carpet tape. Make sure that you have a light switch at  the top of the stairs and the bottom of the stairs. If you do not have them, ask someone to add them for you. What else can I do to help prevent falls? Wear shoes that: Do not have high heels. Have rubber bottoms. Are comfortable and fit you well. Are closed at the toe. Do not wear sandals. If you use a stepladder: Make sure that it is fully opened. Do not climb a closed stepladder. Make sure that both sides of the stepladder are locked into place. Ask someone to hold it for you, if possible. Clearly mark and make sure that you can see: Any grab bars or handrails. First and last steps. Where the edge of each step is. Use tools that help you move around (mobility aids) if they are needed. These include: Canes. Walkers. Scooters. Crutches. Turn on the  lights when you go into a dark area. Replace any light bulbs as soon as they burn out. Set up your furniture so you have a clear path. Avoid moving your furniture around. If any of your floors are uneven, fix them. If there are any pets around you, be aware of where they are. Review your medicines with your doctor. Some medicines can make you feel dizzy. This can increase your chance of falling. Ask your doctor what other things that you can do to help prevent falls. This information is not intended to replace advice given to you by your health care provider. Make sure you discuss any questions you have with your health care provider. Document Released: 02/23/2009 Document Revised: 10/05/2015 Document Reviewed: 06/03/2014 Elsevier Interactive Patient Education  2017 [employer].

## 2022-01-10 NOTE — Progress Notes (Signed)
Subjective:   Troy [name] is a 76 y.o. male who presents for Medicare Annual/Subsequent preventive examination.  I connected with  Troy [name] on 01/10/22 by a telephone enabled telemedicine application and verified that I am speaking with the correct person using two identifiers.   I discussed the limitations of evaluation and management by telemedicine. The patient expressed understanding and agreed to proceed.  Patient location: home  Provider location:  Tele-Health-home    Review of Systems     Cardiac Risk Factors include: advanced age (>69men, >36 women);obesity (BMI >30kg/m2);sedentary lifestyle;male gender     Objective:    Today's Vitals   There is no height or weight on file to calculate BMI.     01/10/2022    9:18 AM 01/10/2022    9:11 AM 12/09/2021    3:14 AM 12/08/2021    5:36 PM 04/16/2019    7:54 AM 01/01/2017    8:45 AM 02/09/2014    3:57 PM  Advanced Directives  Does Patient Have a Medical Advance Directive? No No  No No No Yes  Type of Transport planner;Living will  Would patient like information on creating a medical advance directive?  No - Patient declined No - Patient declined        Current Medications (verified) Outpatient Encounter Medications as of 01/10/2022  Medication Sig   aspirin 81 MG chewable tablet Chew 1 tablet (81 mg total) by mouth daily.   clopidogrel (PLAVIX) 75 MG tablet Take 1 tablet (75 mg total) by mouth daily with breakfast.   donepezil (ARICEPT) 5 MG tablet TAKE ONE TABLET BY MOUTH DAILY FOR MEMORY   Elderberry 575 MG/5ML SYRP Take 5 mLs by mouth daily.   folic acid (FOLVITE) 1 MG tablet Take 1 mg by mouth daily.   memantine (NAMENDA) 10 MG tablet Take 1 tablet (10 mg total) by mouth 2 (two) times daily.   methotrexate (RHEUMATREX) 2.5 MG tablet Take 15 mg by mouth once a week. Take 6 tablets (15 mg) on Saturdays   mirabegron ER (MYRBETRIQ) 25 MG TB24 tablet Take 25 mg by mouth daily.    rosuvastatin (CRESTOR) 10 MG tablet Take 1 tablet (10 mg total) by mouth daily.   traZODone (DESYREL) 50 MG tablet Take 50 mg by mouth at bedtime.   venlafaxine (EFFEXOR) 37.5 MG tablet Take 37.5 mg by mouth as needed.   baricitinib (OLUMIANT) 2 MG TABS tablet Take 2 mg by mouth daily. (Patient not taking: Reported on 01/10/2022)   No facility-administered encounter medications on file as of 01/10/2022.    Allergies (verified) Chlorzoxazone and Elemental sulfur   History: History reviewed. No pertinent past medical history. Past Surgical History:  Procedure Laterality Date   BACK SURGERY  04/1998   CORONARY STENT INTERVENTION N/A 12/12/2021   Procedure: CORONARY STENT INTERVENTION;  Surgeon: Leonie Man, MD;  Location: [facility];  Service: Cardiovascular;  Laterality: N/A;   LEFT HEART CATH AND CORONARY ANGIOGRAPHY N/A 12/12/2021   Procedure: LEFT HEART CATH AND CORONARY ANGIOGRAPHY;  Surgeon: [name], MD;  Location: [facility];  Service: Cardiovascular;  Laterality: N/A;   LIPOMA EXCISION  09/2009   ROTATOR CUFF REPAIR Left 2012   TONSILLECTOMY AND ADENOIDECTOMY  age 39   Family History  Problem Relation Age of Onset   Dementia Mother    Colon cancer Father 51   Social History   Socioeconomic History  Marital status: Married    Spouse name: Stanton Kidney   Number of children: 3   Years of education: Not on file   Highest education level: Not on file  Occupational History   Not on file  Tobacco Use   Smoking status: Never    Passive exposure: Past   Smokeless tobacco: Never  Vaping Use   Vaping Use: Never used  Substance and Sexual Activity   Alcohol use: No    Comment: quit over 39 years ago   Drug use: No   Sexual activity: Not on file  Other Topics Concern   Not on file  Social History Narrative   Right handed   Lives in single story home with wife   Social Determinants of Health   Financial Resource Strain: Low Risk  (01/10/2022)   Overall  Financial Resource Strain (CARDIA)    Difficulty of Paying Living Expenses: Not hard at all  Food Insecurity: No Food Insecurity (01/10/2022)   Hunger Vital Sign    Worried About Running Out of Food in the Last Year: Never true    Ran Out of Food in the Last Year: Never true  Transportation Needs: No Transportation Needs (01/10/2022)   PRAPARE - Administrator, Civil Service (Medical): No    Lack of Transportation (Non-Medical): No  Physical Activity: Inactive (01/10/2022)   Exercise Vital Sign    Days of Exercise per Week: 0 days    Minutes of Exercise per Session: 0 min  Stress: No Stress Concern Present (01/10/2022)   [employer] of Occupational Health - Occupational Stress Questionnaire    Feeling of Stress : Not at all  Social Connections: Moderately Integrated (01/10/2022)   Social Connection and Isolation Panel [NHANES]    Frequency of Communication with Friends and Family: Never    Frequency of Social Gatherings with Friends and Family: Three times a week    Attends Religious Services: More than 4 times per year    Active Member of Clubs or Organizations: No    Attends Banker Meetings: Never    Marital Status: Married    Tobacco Counseling Counseling given: Not Answered   Clinical Intake:  Pre-visit preparation completed: Yes  Pain : No/denies pain     Nutritional Risks: None Diabetes: No  How often do you need to have someone help you when you read instructions, pamphlets, or other written materials from your doctor or pharmacy?: 5 - Always (wife reads)  Diabetic?  no  Interpreter Needed?: No  Information entered by :: Remi Haggard LPN   Activities of Daily Living    01/10/2022    9:18 AM 12/08/2021    9:06 PM  In your present state of health, do you have any difficulty performing the following activities:  Hearing? 0 0  Vision? 0 1  Comment  glasses  Difficulty concentrating or making decisions? 1 1  Walking or climbing  stairs? 1 1  Dressing or bathing? 1 0  Doing errands, shopping? 1 1  Preparing Food and eating ? Y   Using the Toilet? N   In the past six months, have you accidently leaked urine? Y   Do you have problems with loss of bowel control? N   Managing your Medications? Y   Managing your Finances? Y   Housekeeping or managing your Housekeeping? Y     Patient Care Team: Tower, Audrie Gallus, MD as PCP - General [place] Chard, DO as Consulting Physician (Neurology)  Indicate  any recent Medical Services you may have received from other than [facility] providers in the past year (date may be approximate).     Assessment:   This is a routine wellness examination for Nathanie.  Hearing/Vision screen Hearing Screening - Comments:: No trouble hearing Vision Screening - Comments:: [place] Up to date  Dietary issues and exercise activities discussed: Current Exercise Habits: The patient does not participate in regular exercise at present   Goals Addressed             This Visit's Progress    Patient Stated       Stay healthy       Depression Screen    01/10/2022    9:12 AM 10/12/2020   12:16 PM 09/24/2019    9:05 AM 01/01/2017    8:27 AM  PHQ 2/9 Scores  PHQ - 2 Score 2 2 0 0  PHQ- 9 Score 2 4  2     Fall Risk    01/10/2022    9:09 AM 01/10/2022    9:06 AM 12/18/2021   11:34 AM 01/09/2021   11:02 AM 10/12/2020   12:14 PM  Fall Risk   Falls in the past year? 0 0 0 1 1  Number falls in past yr: 0 0  1 1  Injury with Fall? 0 0  1 1  Risk for fall due to :  Impaired balance/gait     Follow up Falls evaluation completed;Education provided;Falls prevention discussed Falls evaluation completed;Education provided;Falls prevention discussed Falls evaluation completed Falls evaluation completed     FALL RISK PREVENTION PERTAINING TO THE HOME:  Any stairs in or around the home? No  If so, are there any without handrails? No  Home free of loose throw rugs in walkways, pet beds, electrical cords,  etc? Yes  Adequate lighting in your home to reduce risk of falls? Yes   ASSISTIVE DEVICES UTILIZED TO PREVENT FALLS:  Life alert? No  Use of a cane, walker or w/c? Yes  Grab bars in the bathroom? Yes  Shower chair or bench in shower? Yes  Elevated toilet seat or a handicapped toilet? Yes   TIMED UP AND GO:  Was the test performed? No .    Cognitive Function:    01/01/2017    8:27 AM  MMSE - Mini Mental State Exam  Orientation to time 5  Orientation to Place 5  Registration 3  Attention/ Calculation 0  Recall 3  Language- name 2 objects 0  Language- repeat 1  Language- follow 3 step command 3  Language- read & follow direction 0  Write a sentence 0  Copy design 0  Total score 20        01/10/2022    9:07 AM  6CIT Screen  What Year? 0 points  What month? 3 points  What time? 3 points  Count back from 20 0 points  Months in reverse 4 points  Repeat phrase 10 points  Total Score 20 points    Immunizations Immunization History  Administered Date(s) Administered   Fluad Quad(high Dose 65+) 02/11/2020, 01/09/2021   Influenza Whole 02/16/2007, 02/21/2010   Influenza, High Dose Seasonal PF 02/01/2019   Influenza,inj,Quad PF,6+ Mos 02/19/2017   Influenza-Unspecified 02/10/2018   PFIZER(Purple Top)SARS-COV-2 Vaccination 06/17/2019, 07/09/2019, 04/11/2020   Pneumococcal Conjugate-13 01/01/2017   Pneumococcal-Unspecified 01/12/2011   Td 11/22/2002, 05/08/2020   Tdap 01/11/2006, 01/26/2015    TDAP status: Up to date  Flu Vaccine status: Up to date  Pneumococcal vaccine status:  Up to date  Covid-19 vaccine status: Information provided on how to obtain vaccines.   Qualifies for Shingles Vaccine? Yes   Zostavax completed No   Shingrix Completed?: No.    Education has been provided regarding the importance of this vaccine. Patient has been advised to call insurance company to determine out of pocket expense if they have not yet received this vaccine. Advised may  also receive vaccine at local pharmacy or Health Dept. Verbalized acceptance and understanding.  Screening Tests Health Maintenance  Topic Date Due   INFLUENZA VACCINE  12/11/2021   COVID-19 Vaccine (4 - Pfizer risk series) 01/26/2022 (Originally 06/06/2020)   Zoster Vaccines- Shingrix (1 of 2) 04/11/2022 (Originally 10/07/1964)   Pneumonia Vaccine 41+ Years old (2 - PPSV23 or PCV20) 01/11/2023 (Originally 01/01/2018)   TETANUS/TDAP  05/08/2030   Hepatitis C Screening  Completed   HPV VACCINES  Aged Out   COLONOSCOPY (Pts 45-57yrs Insurance coverage will need to be confirmed)  Discontinued    Health Maintenance  Health Maintenance Due  Topic Date Due   INFLUENZA VACCINE  12/11/2021    Colorectal cancer screening: No longer required.   Lung Cancer Screening: (Low Dose CT Chest recommended if Age 69-80 years, 30 pack-year currently smoking OR have quit w/in 15years.) does not qualify.   Lung Cancer Screening Referral:   Additional Screening:  Hepatitis C Screening: does not qualify; Completed 2018  Vision Screening: Recommended annual ophthalmology exams for early detection of glaucoma and other disorders of the eye. Is the patient up to date with their annual eye exam?  Yes  Who is the provider or what is the name of the office in which the patient attends annual eye exams? [facility] If pt is not established with a provider, would they like to be referred to a provider to establish care? No .   Dental Screening: Recommended annual dental exams for proper oral hygiene  Community Resource Referral / Chronic Care Management: CRR required this visit?  No   CCM required this visit?  No      Plan:     I have personally reviewed and noted the following in the patient's chart:   Medical and social history Use of alcohol, tobacco or illicit drugs  Current medications and supplements including opioid prescriptions. Patient is not currently taking opioid prescriptions. Functional  ability and status Nutritional status Physical activity Advanced directives List of other physicians Hospitalizations, surgeries, and ER visits in previous 12 months Vitals Screenings to include cognitive, depression, and falls Referrals and appointments  In addition, I have reviewed and discussed with patient certain preventive protocols, quality metrics, and best practice recommendations. A written personalized care plan for preventive services as well as general preventive health recommendations were provided to patient.     Leroy Kennedy, LPN   [phone]   Nurse Notes:

## 2022-01-16 DIAGNOSIS — R351 Nocturia: Secondary | ICD-10-CM | POA: Diagnosis not present

## 2022-01-16 DIAGNOSIS — R350 Frequency of micturition: Secondary | ICD-10-CM | POA: Diagnosis not present

## 2022-01-28 ENCOUNTER — Other Ambulatory Visit: Payer: Self-pay | Admitting: Family Medicine

## 2022-02-04 ENCOUNTER — Ambulatory Visit (INDEPENDENT_AMBULATORY_CARE_PROVIDER_SITE_OTHER): Payer: PPO | Admitting: Family Medicine

## 2022-02-04 ENCOUNTER — Encounter: Payer: Self-pay | Admitting: Family Medicine

## 2022-02-04 VITALS — BP 110/66 | HR 60 | Temp 97.8°F | Ht 72.5 in | Wt 240.5 lb

## 2022-02-04 DIAGNOSIS — B356 Tinea cruris: Secondary | ICD-10-CM | POA: Diagnosis not present

## 2022-02-04 DIAGNOSIS — N401 Enlarged prostate with lower urinary tract symptoms: Secondary | ICD-10-CM | POA: Diagnosis not present

## 2022-02-04 DIAGNOSIS — E782 Mixed hyperlipidemia: Secondary | ICD-10-CM | POA: Diagnosis not present

## 2022-02-04 DIAGNOSIS — I5022 Chronic systolic (congestive) heart failure: Secondary | ICD-10-CM | POA: Diagnosis not present

## 2022-02-04 DIAGNOSIS — E559 Vitamin D deficiency, unspecified: Secondary | ICD-10-CM | POA: Diagnosis not present

## 2022-02-04 DIAGNOSIS — R7303 Prediabetes: Secondary | ICD-10-CM

## 2022-02-04 DIAGNOSIS — Z0000 Encounter for general adult medical examination without abnormal findings: Secondary | ICD-10-CM | POA: Diagnosis not present

## 2022-02-04 DIAGNOSIS — F03B0 Unspecified dementia, moderate, without behavioral disturbance, psychotic disturbance, mood disturbance, and anxiety: Secondary | ICD-10-CM | POA: Diagnosis not present

## 2022-02-04 DIAGNOSIS — R350 Frequency of micturition: Secondary | ICD-10-CM | POA: Diagnosis not present

## 2022-02-04 DIAGNOSIS — I2510 Atherosclerotic heart disease of native coronary artery without angina pectoris: Secondary | ICD-10-CM

## 2022-02-04 DIAGNOSIS — Z23 Encounter for immunization: Secondary | ICD-10-CM | POA: Diagnosis not present

## 2022-02-04 DIAGNOSIS — M0609 Rheumatoid arthritis without rheumatoid factor, multiple sites: Secondary | ICD-10-CM | POA: Diagnosis not present

## 2022-02-04 DIAGNOSIS — L989 Disorder of the skin and subcutaneous tissue, unspecified: Secondary | ICD-10-CM | POA: Insufficient documentation

## 2022-02-04 LAB — CBC WITH DIFFERENTIAL/PLATELET
Basophils Absolute: 0 10*3/uL (ref 0.0–0.1)
Basophils Relative: 0.6 % (ref 0.0–3.0)
Eosinophils Absolute: 0.1 10*3/uL (ref 0.0–0.7)
Eosinophils Relative: 3.1 % (ref 0.0–5.0)
HCT: 42.3 % (ref 39.0–52.0)
Hemoglobin: 14.4 g/dL (ref 13.0–17.0)
Lymphocytes Relative: 13.5 % (ref 12.0–46.0)
Lymphs Abs: 0.5 10*3/uL — ABNORMAL LOW (ref 0.7–4.0)
MCHC: 34 g/dL (ref 30.0–36.0)
MCV: 92.1 fl (ref 78.0–100.0)
Monocytes Absolute: 0.3 10*3/uL (ref 0.1–1.0)
Monocytes Relative: 7.8 % (ref 3.0–12.0)
Neutro Abs: 3 10*3/uL (ref 1.4–7.7)
Neutrophils Relative %: 75 % (ref 43.0–77.0)
Platelets: 150 10*3/uL (ref 150.0–400.0)
RBC: 4.59 Mil/uL (ref 4.22–5.81)
RDW: 13.8 % (ref 11.5–15.5)
WBC: 4 10*3/uL (ref 4.0–10.5)

## 2022-02-04 LAB — COMPREHENSIVE METABOLIC PANEL
ALT: 18 U/L (ref 0–53)
AST: 21 U/L (ref 0–37)
Albumin: 3.9 g/dL (ref 3.5–5.2)
Alkaline Phosphatase: 67 U/L (ref 39–117)
BUN: 18 mg/dL (ref 6–23)
CO2: 33 mEq/L — ABNORMAL HIGH (ref 19–32)
Calcium: 9.1 mg/dL (ref 8.4–10.5)
Chloride: 104 mEq/L (ref 96–112)
Creatinine, Ser: 1.09 mg/dL (ref 0.40–1.50)
GFR: 66.01 mL/min (ref >60.00)
Glucose, Bld: 94 mg/dL (ref 70–99)
Potassium: 4.6 mEq/L (ref 3.5–5.1)
Sodium: 142 mEq/L (ref 135–145)
Total Bilirubin: 0.7 mg/dL (ref 0.2–1.2)
Total Protein: 6 g/dL (ref 6.0–8.3)

## 2022-02-04 LAB — TSH: TSH: 1.61 u[IU]/mL (ref 0.35–5.50)

## 2022-02-04 LAB — LIPID PANEL
Cholesterol: 124 mg/dL (ref 0–200)
HDL: 52.5 mg/dL (ref >39.00)
LDL Cholesterol: 62 mg/dL (ref 0–99)
NonHDL: 71.74
Total CHOL/HDL Ratio: 2
Triglycerides: 48 mg/dL (ref 0.0–149.0)
VLDL: 9.6 mg/dL (ref 0.0–40.0)

## 2022-02-04 LAB — PSA, MEDICARE: PSA: 1.74 ng/ml (ref 0.10–4.00)

## 2022-02-04 LAB — VITAMIN D 25 HYDROXY (VIT D DEFICIENCY, FRACTURES): VITD: 41.79 ng/mL (ref 30.00–100.00)

## 2022-02-04 MED ORDER — CICLOPIROX OLAMINE 0.77 % EX CREA: TOPICAL_CREAM | Freq: Two times a day (BID) | CUTANEOUS | 3 refills | Status: AC

## 2022-02-04 NOTE — Assessment & Plan Note (Addendum)
Left upper chest Cannot r/o early skin cancer (scale/erythema) Referral done to dermatology

## 2022-02-04 NOTE — Patient Instructions (Addendum)
Consider using the effexor xr if needed for mood/ depression   If you are interested in the shingles vaccine series (Shingrix), call your insurance or pharmacy to check on coverage and location it must be given.  If affordable - you can schedule it here or at your pharmacy depending on coverage   I placed a referral to the dermatologist for the skin lesion on your chest  If you don't hear back in 1-2 weeks - then call the dermatology office Allyson Sabal)   Use sunscreen when outside   Labs today  Flu shot today    Continue to treat the skin fungal infection  I refilled the loprox topical medicine Keep areas dry if possible

## 2022-02-04 NOTE — Assessment & Plan Note (Signed)
D level today  Taking D3 Disc imp to bone and overall health

## 2022-02-04 NOTE — Progress Notes (Signed)
Subjective:    Patient ID: Troy [name], male    DOB: 1945-12-31, 76 y.o.   MRN: [id number]  HPI Here for health maintenance exam and to review chronic medical problems    Wt Readings from Last 3 Encounters:  02/04/22 240 lb 8 oz (109.1 kg)  12/18/21 240 lb (108.9 kg)  12/13/21 240 lb 3.2 oz (109 kg)   32.17 kg/m Appetite is good  He cannot remember that he has eaten   Has been good overall  Nothing new    Immunization History  Administered Date(s) Administered   Fluad Quad(high Dose 65+) 02/11/2020, 01/09/2021   Influenza Whole 02/16/2007, 02/21/2010   Influenza, High Dose Seasonal PF 02/01/2019   Influenza,inj,Quad PF,6+ Mos 02/19/2017   Influenza-Unspecified 02/10/2018   PFIZER(Purple Top)SARS-COV-2 Vaccination 06/17/2019, 07/09/2019, 04/11/2020   Pneumococcal Conjugate-13 01/01/2017   Pneumococcal-Unspecified 01/12/2011   Td 11/22/2002, 05/08/2020   Tdap 01/11/2006, 01/26/2015   Health Maintenance Due  Topic Date Due   COVID-19 Vaccine (4 - Pfizer risk series) 06/06/2020   INFLUENZA VACCINE  12/11/2021    Flu shot-today   Zoster vaccine- declines for now  Had shingles in May   Colonoscopy 2020 with no recall   Prostate health  H/o BPH Takes myrbetirq for urinary problems - has helped a lot!  Is expensive Gets some samples - will go into the donut hole   Lab Results  Component Value Date   PSA 1.39 01/02/2021   PSA 1.65 01/01/2017   PSA 1.18 12/06/2013     H/o CAD and systolic CHF  BP Readings from Last 3 Encounters:  02/04/22 110/66  12/18/21 102/68  12/13/21 117/71   Pulse Readings from Last 3 Encounters:  02/04/22 60  12/18/21 62  12/13/21 67   Dementia- more forgetful lately  Under care of neuro Aricept  Namenda   Mood has been fair  May be a little depressed  Not doing quite as much- wife cannot get him to go for a walk Will use a pedaler at home   Effexor xr 37.5 -has px filled but has never taken  It was given for  agitation and depression  Sleep is fair - was up last night  Prefers to sleep on th couch and wife does not push the issue    RA Under care of rheumatology  Prediabetes Lab Results  Component Value Date   HGBA1C 5.7 01/02/2021   Due for labs   Hyperlipidemia Lab Results  Component Value Date   CHOL 172 12/13/2021   HDL 51 12/13/2021   LDLCALC 113 (H) 12/13/2021   LDLDIRECT 136.9 07/19/2009   TRIG 41 12/13/2021   CHOLHDL 3.4 12/13/2021   On crestor 10 mg daily   Vit D def-taking vitamin D   Patient Active Problem List   Diagnosis Date Noted   Skin lesion 02/04/2022   CAD (coronary artery disease) 78/29/5621   Systolic CHF ([place]) 30/86/5784   Syncope 12/08/2021   Ingrown nail of great toe of right foot 07/20/2021   Medicare annual wellness visit, subsequent 01/09/2021   Tinea cruris 06/20/2020   Mesenteric lymphadenopathy 06/15/2020   Dementia ([place]) 02/11/2020   Constipation 11/23/2019   BPH (benign prostatic hyperplasia) 11/23/2019   Microscopic hematuria 11/23/2019   Abnormal CXR 11/04/2019   Vitamin D deficiency 09/24/2019   Rheumatoid arthritis ([place]) 09/14/2019   Mild dehydration 09/14/2019   Positive ANA (antinuclear antibody) 03/21/2019   Anxiety 06/09/2015   Routine general medical examination at a health care facility  12/10/2013   Family history of colon cancer 12/10/2013   Prostate cancer screening 12/05/2013   Allergic rhinitis 10/18/2013   ROTATOR CUFF INJURY, LEFT SHOULDER 04/30/2010   Prediabetes 08/21/2009   HYPERLIPIDEMIA 04/20/2007   OSTEOARTHRITIS 02/09/2007   No past medical history on file. Past Surgical History:  Procedure Laterality Date   BACK SURGERY  04/1998   CORONARY STENT INTERVENTION N/A 12/12/2021   Procedure: CORONARY STENT INTERVENTION;  Surgeon: Marykay Lex, MD;  Location[facility] INVASIVE CV LAB;  Service: Cardiovascular;  Laterality: N/A;   LEFT HEART CATH AND CORONARY ANGIOGRAPHY N/A 12/12/2021   Procedure: LEFT HEART CATH  AND CORONARY ANGIOGRAPHY;  Surgeon: Orpah Cobb, MD;  Location: [facility] INVASIVE CV LAB;  Service: Cardiovascular;  Laterality: N/A;   LIPOMA EXCISION  09/2009   ROTATOR CUFF REPAIR Left 2012   TONSILLECTOMY AND ADENOIDECTOMY  age 89   Social History   Tobacco Use   Smoking status: Never    Passive exposure: Past   Smokeless tobacco: Never  Vaping Use   Vaping Use: Never used  Substance Use Topics   Alcohol use: No    Comment: quit over 39 years ago   Drug use: No   Family History  Problem Relation Age of Onset   Dementia Mother    Colon cancer Father 66   Allergies  Allergen Reactions   Chlorzoxazone     REACTION: dizzy   Elemental Sulfur Rash   Current Outpatient Medications on File Prior to Visit  Medication Sig Dispense Refill   aspirin 81 MG chewable tablet Chew 1 tablet (81 mg total) by mouth daily. 30 tablet 1   clopidogrel (PLAVIX) 75 MG tablet Take 1 tablet (75 mg total) by mouth daily with breakfast. 30 tablet 1   donepezil (ARICEPT) 5 MG tablet TAKE ONE TABLET BY MOUTH DAILY FOR MEMORY     Elderberry 575 MG/5ML SYRP Take 5 mLs by mouth daily.     folic acid (FOLVITE) 1 MG tablet Take 1 mg by mouth daily.     memantine (NAMENDA) 10 MG tablet Take 1 tablet (10 mg total) by mouth 2 (two) times daily. 60 tablet 5   methotrexate (RHEUMATREX) 2.5 MG tablet Take 15 mg by mouth once a week. Take 6 tablets (15 mg) on Saturdays     mirabegron ER (MYRBETRIQ) 25 MG TB24 tablet Take 25 mg by mouth daily.     rosuvastatin (CRESTOR) 10 MG tablet Take 1 tablet (10 mg total) by mouth daily. 30 tablet 1   traZODone (DESYREL) 50 MG tablet Take 50 mg by mouth at bedtime.     venlafaxine (EFFEXOR) 37.5 MG tablet Take 37.5 mg by mouth as needed.     No current facility-administered medications on file prior to visit.     Review of Systems  Constitutional:  Positive for fatigue. Negative for activity change, appetite change, fever and unexpected weight change.  HENT:  Negative for  congestion, rhinorrhea, sore throat and trouble swallowing.   Eyes:  Negative for pain, redness, itching and visual disturbance.  Respiratory:  Negative for cough, chest tightness, shortness of breath and wheezing.   Cardiovascular:  Negative for chest pain and palpitations.  Gastrointestinal:  Negative for abdominal pain, blood in stool, constipation, diarrhea and nausea.  Endocrine: Negative for cold intolerance, heat intolerance, polydipsia and polyuria.  Genitourinary:  Negative for difficulty urinating, dysuria, frequency and urgency.  Musculoskeletal:  Negative for arthralgias, joint swelling and myalgias.  Skin:  Negative for pallor and rash.  Neurological:  Negative for dizziness, tremors, weakness, numbness and headaches.  Hematological:  Negative for adenopathy. Does not bruise/bleed easily.  Psychiatric/Behavioral:  Positive for decreased concentration and dysphoric mood. The patient is not nervous/anxious.        Dementia is worse  Not agitated recently        Objective:   Physical Exam Constitutional:      General: He is not in acute distress.    Appearance: Normal appearance. He is well-developed. He is obese. He is not ill-appearing or diaphoretic.  HENT:     Head: Normocephalic and atraumatic.     Right Ear: Tympanic membrane, ear canal and external ear normal.     Left Ear: Tympanic membrane, ear canal and external ear normal.     Nose: Nose normal. No congestion.     Mouth/Throat:     Mouth: Mucous membranes are moist.     Pharynx: Oropharynx is clear. No posterior oropharyngeal erythema.  Eyes:     General: No scleral icterus.       Right eye: No discharge.        Left eye: No discharge.     Conjunctiva/sclera: Conjunctivae normal.     Pupils: Pupils are equal, round, and reactive to light.  Neck:     Thyroid: No thyromegaly.     Vascular: No carotid bruit or JVD.  Cardiovascular:     Rate and Rhythm: Normal rate and regular rhythm.     Pulses: Normal  pulses.     Heart sounds: Normal heart sounds.     No gallop.  Pulmonary:     Effort: Pulmonary effort is normal. No respiratory distress.     Breath sounds: Normal breath sounds. No wheezing or rales.     Comments: Good air exch Chest:     Chest wall: No tenderness.  Abdominal:     General: Bowel sounds are normal. There is no distension or abdominal bruit.     Palpations: Abdomen is soft. There is no mass.     Tenderness: There is no abdominal tenderness.     Hernia: No hernia is present.  Musculoskeletal:        General: No tenderness.     Cervical back: Normal range of motion and neck supple. No rigidity. No muscular tenderness.     Right lower leg: No edema.     Left lower leg: No edema.  Lymphadenopathy:     Cervical: No cervical adenopathy.  Skin:    General: Skin is warm and dry.     Coloration: Skin is not pale.     Findings: No erythema or rash.     Comments: Solar lentigines diffusely  Skin lesion noted on L ant chest with scale and erythema/raised    Many SKs, scattered   Neurological:     Mental Status: He is alert.     Cranial Nerves: No cranial nerve deficit.     Motor: No abnormal muscle tone.     Coordination: Coordination normal.     Gait: Gait normal.     Deep Tendon Reflexes: Reflexes are normal and symmetric. Reflexes normal.  Psychiatric:        Attention and Perception: Attention normal.        Mood and Affect: Mood is depressed.        Speech: Speech normal.        Behavior: Behavior is withdrawn.        Cognition and Memory: Cognition normal. He exhibits impaired  recent memory and impaired remote memory.     Comments: Baseline dementia   Seems down /more depressed today  Is able to laugh and joke at times however            Assessment & Plan:   Problem List Items Addressed This Visit       Cardiovascular and Mediastinum   CAD (coronary artery disease)    Continues statin and asa and plavix  Clinically quiet  Under care of  cardiology      Relevant Orders   Lipid panel   Comprehensive metabolic panel   CBC with Differential/Platelet   Systolic CHF (HCC)    Under care of cardiology No clinical changes       Relevant Orders   Comprehensive metabolic panel     Nervous and Auditory   Dementia (HCC)    Under neuro care  Progressing  More symptoms of depression lately (lethargic/unmotivated)  Disc trial of effexor that they have at home       Relevant Orders   TSH   CBC with Differential/Platelet     Musculoskeletal and Integument   Rheumatoid arthritis (HCC)    Clinically fair control/stable Under rheum care  On mtx and folic acid       Skin lesion    Left upper chest Cannot r/o early skin cancer (scale/erythema) Referral done to dermatology      Relevant Orders   Ambulatory referral to Dermatology   Tinea cruris    Ongoing  Difficult to get pt to bathe (dementia) Disc staying dry when able Refilled loprox for prn use        Relevant Medications   ciclopirox (LOPROX) 0.77 % cream     Genitourinary   BPH (benign prostatic hyperplasia)    myrbetriq is helpful  psa ordered      Relevant Orders   PSA, Medicare     Other   HYPERLIPIDEMIA    Disc goals for lipids and reasons to control them Rev last labs with pt Rev low sat fat diet in detail  Lab ordered Taking crestor 10 mg daily in setting of CAD      Prediabetes    psa ordered  Appetite is up / with dementia   disc imp of low glycemic diet and wt loss to prevent DM2       Relevant Orders   Comprehensive metabolic panel   Hemoglobin A1c   Routine general medical examination at a health care facility - Primary    Reviewed health habits including diet and exercise and skin cancer prevention Reviewed appropriate screening tests for age  Also reviewed health mt list, fam hx and immunization status , as well as social and family history   See HPI Labs ordered  Flu shot given  Declines shingrix  Colonoscopy utd  2020  psa ordered for pt with BPH Taking vit D and folic acid       Vitamin D deficiency    D level today  Taking D3 Disc imp to bone and overall health       Relevant Orders   VITAMIN D 25 Hydroxy (Vit-D Deficiency, Fractures)   Other Visit Diagnoses     Need for immunization against influenza       Relevant Orders   Flu Vaccine QUAD High Dose(Fluad) (Completed)

## 2022-02-04 NOTE — Assessment & Plan Note (Signed)
Disc goals for lipids and reasons to control them Rev last labs with pt Rev low sat fat diet in detail  Lab ordered Taking crestor 10 mg daily in setting of CAD

## 2022-02-04 NOTE — Assessment & Plan Note (Signed)
Clinically fair control/stable Under rheum care  On mtx and folic acid

## 2022-02-04 NOTE — Assessment & Plan Note (Signed)
Reviewed health habits including diet and exercise and skin cancer prevention Reviewed appropriate screening tests for age  Also reviewed health mt list, fam hx and immunization status , as well as social and family history   See HPI Labs ordered  Flu shot given  Declines shingrix  Colonoscopy utd 2020  psa ordered for pt with BPH Taking vit D and folic acid

## 2022-02-04 NOTE — Assessment & Plan Note (Signed)
Continues statin and asa and plavix  Clinically quiet  Under care of cardiology

## 2022-02-04 NOTE — Assessment & Plan Note (Signed)
Under neuro care  Progressing  More symptoms of depression lately (lethargic/unmotivated)  Disc trial of effexor that they have at home

## 2022-02-04 NOTE — Assessment & Plan Note (Signed)
Under care of cardiology  No clinical changes  

## 2022-02-04 NOTE — Assessment & Plan Note (Signed)
myrbetriq is helpful  psa ordered

## 2022-02-04 NOTE — Assessment & Plan Note (Signed)
psa ordered  Appetite is up / with dementia   disc imp of low glycemic diet and wt loss to prevent DM2

## 2022-02-04 NOTE — Assessment & Plan Note (Signed)
Ongoing  Difficult to get pt to bathe (dementia) Disc staying dry when able Refilled loprox for prn use

## 2022-02-05 LAB — HEMOGLOBIN A1C: Hgb A1c MFr Bld: 5.6 % (ref 4.6–6.5)

## 2022-02-14 ENCOUNTER — Encounter: Payer: Self-pay | Admitting: *Deleted

## 2022-03-05 DIAGNOSIS — Z9861 Coronary angioplasty status: Secondary | ICD-10-CM | POA: Diagnosis not present

## 2022-03-05 DIAGNOSIS — I2510 Atherosclerotic heart disease of native coronary artery without angina pectoris: Secondary | ICD-10-CM | POA: Diagnosis not present

## 2022-03-05 DIAGNOSIS — E7849 Other hyperlipidemia: Secondary | ICD-10-CM | POA: Diagnosis not present

## 2022-03-05 DIAGNOSIS — I340 Nonrheumatic mitral (valve) insufficiency: Secondary | ICD-10-CM | POA: Diagnosis not present

## 2022-03-07 DIAGNOSIS — M0609 Rheumatoid arthritis without rheumatoid factor, multiple sites: Secondary | ICD-10-CM | POA: Diagnosis not present

## 2022-03-07 DIAGNOSIS — G3184 Mild cognitive impairment, so stated: Secondary | ICD-10-CM | POA: Diagnosis not present

## 2022-03-07 DIAGNOSIS — M25561 Pain in right knee: Secondary | ICD-10-CM | POA: Diagnosis not present

## 2022-03-07 DIAGNOSIS — R768 Other specified abnormal immunological findings in serum: Secondary | ICD-10-CM | POA: Diagnosis not present

## 2022-03-07 DIAGNOSIS — M79646 Pain in unspecified finger(s): Secondary | ICD-10-CM | POA: Diagnosis not present

## 2022-03-07 DIAGNOSIS — Z79899 Other long term (current) drug therapy: Secondary | ICD-10-CM | POA: Diagnosis not present

## 2022-03-07 DIAGNOSIS — Z13820 Encounter for screening for osteoporosis: Secondary | ICD-10-CM | POA: Diagnosis not present

## 2022-03-07 DIAGNOSIS — M25511 Pain in right shoulder: Secondary | ICD-10-CM | POA: Diagnosis not present

## 2022-03-07 DIAGNOSIS — M1990 Unspecified osteoarthritis, unspecified site: Secondary | ICD-10-CM | POA: Diagnosis not present

## 2022-04-15 DIAGNOSIS — D492 Neoplasm of unspecified behavior of bone, soft tissue, and skin: Secondary | ICD-10-CM | POA: Diagnosis not present

## 2022-04-15 DIAGNOSIS — D225 Melanocytic nevi of trunk: Secondary | ICD-10-CM | POA: Diagnosis not present

## 2022-04-15 DIAGNOSIS — L821 Other seborrheic keratosis: Secondary | ICD-10-CM | POA: Diagnosis not present

## 2022-04-15 DIAGNOSIS — L814 Other melanin hyperpigmentation: Secondary | ICD-10-CM | POA: Diagnosis not present

## 2022-04-24 DIAGNOSIS — R296 Repeated falls: Secondary | ICD-10-CM | POA: Diagnosis not present

## 2022-04-24 DIAGNOSIS — F413 Other mixed anxiety disorders: Secondary | ICD-10-CM | POA: Diagnosis not present

## 2022-04-24 DIAGNOSIS — H53149 Visual discomfort, unspecified: Secondary | ICD-10-CM | POA: Diagnosis not present

## 2022-04-24 DIAGNOSIS — R413 Other amnesia: Secondary | ICD-10-CM | POA: Diagnosis not present

## 2022-04-24 DIAGNOSIS — R519 Headache, unspecified: Secondary | ICD-10-CM | POA: Diagnosis not present

## 2022-04-24 DIAGNOSIS — R4586 Emotional lability: Secondary | ICD-10-CM | POA: Diagnosis not present

## 2022-06-10 DIAGNOSIS — M25511 Pain in right shoulder: Secondary | ICD-10-CM | POA: Diagnosis not present

## 2022-06-10 DIAGNOSIS — Z13820 Encounter for screening for osteoporosis: Secondary | ICD-10-CM | POA: Diagnosis not present

## 2022-06-10 DIAGNOSIS — G3184 Mild cognitive impairment, so stated: Secondary | ICD-10-CM | POA: Diagnosis not present

## 2022-06-10 DIAGNOSIS — Z79899 Other long term (current) drug therapy: Secondary | ICD-10-CM | POA: Diagnosis not present

## 2022-06-10 DIAGNOSIS — R768 Other specified abnormal immunological findings in serum: Secondary | ICD-10-CM | POA: Diagnosis not present

## 2022-06-10 DIAGNOSIS — M0609 Rheumatoid arthritis without rheumatoid factor, multiple sites: Secondary | ICD-10-CM | POA: Diagnosis not present

## 2022-06-10 DIAGNOSIS — M1990 Unspecified osteoarthritis, unspecified site: Secondary | ICD-10-CM | POA: Diagnosis not present

## 2022-06-10 DIAGNOSIS — M25561 Pain in right knee: Secondary | ICD-10-CM | POA: Diagnosis not present

## 2022-07-09 DIAGNOSIS — Z9861 Coronary angioplasty status: Secondary | ICD-10-CM | POA: Diagnosis not present

## 2022-07-09 DIAGNOSIS — E7849 Other hyperlipidemia: Secondary | ICD-10-CM | POA: Diagnosis not present

## 2022-07-09 DIAGNOSIS — I340 Nonrheumatic mitral (valve) insufficiency: Secondary | ICD-10-CM | POA: Diagnosis not present

## 2022-07-09 DIAGNOSIS — I2510 Atherosclerotic heart disease of native coronary artery without angina pectoris: Secondary | ICD-10-CM | POA: Diagnosis not present

## 2022-09-09 DIAGNOSIS — R768 Other specified abnormal immunological findings in serum: Secondary | ICD-10-CM | POA: Diagnosis not present

## 2022-09-09 DIAGNOSIS — G3184 Mild cognitive impairment, so stated: Secondary | ICD-10-CM | POA: Diagnosis not present

## 2022-09-09 DIAGNOSIS — Z79899 Other long term (current) drug therapy: Secondary | ICD-10-CM | POA: Diagnosis not present

## 2022-09-09 DIAGNOSIS — Z13820 Encounter for screening for osteoporosis: Secondary | ICD-10-CM | POA: Diagnosis not present

## 2022-09-09 DIAGNOSIS — M25511 Pain in right shoulder: Secondary | ICD-10-CM | POA: Diagnosis not present

## 2022-09-09 DIAGNOSIS — M0609 Rheumatoid arthritis without rheumatoid factor, multiple sites: Secondary | ICD-10-CM | POA: Diagnosis not present

## 2022-09-09 DIAGNOSIS — M1990 Unspecified osteoarthritis, unspecified site: Secondary | ICD-10-CM | POA: Diagnosis not present

## 2022-09-09 DIAGNOSIS — M25561 Pain in right knee: Secondary | ICD-10-CM | POA: Diagnosis not present

## 2022-09-16 ENCOUNTER — Telehealth: Payer: Self-pay | Admitting: Family Medicine

## 2022-09-16 NOTE — Telephone Encounter (Signed)
Contacted Troy [name] to schedule their annual wellness visit. Appointment made for 10/23/2022.  [facility] Care Guide [facility] AWV TEAM Direct Dial: [phone]

## 2022-10-15 ENCOUNTER — Encounter: Payer: Self-pay | Admitting: Family Medicine

## 2022-10-15 ENCOUNTER — Ambulatory Visit (INDEPENDENT_AMBULATORY_CARE_PROVIDER_SITE_OTHER): Payer: PPO | Admitting: Family Medicine

## 2022-10-15 VITALS — BP 118/62 | HR 66 | Temp 97.4°F | Ht 72.5 in | Wt 234.4 lb

## 2022-10-15 DIAGNOSIS — F03B0 Unspecified dementia, moderate, without behavioral disturbance, psychotic disturbance, mood disturbance, and anxiety: Secondary | ICD-10-CM

## 2022-10-15 DIAGNOSIS — R350 Frequency of micturition: Secondary | ICD-10-CM

## 2022-10-15 DIAGNOSIS — R3129 Other microscopic hematuria: Secondary | ICD-10-CM

## 2022-10-15 DIAGNOSIS — R001 Bradycardia, unspecified: Secondary | ICD-10-CM

## 2022-10-15 LAB — POC URINALSYSI DIPSTICK (AUTOMATED)
Glucose, UA: NEGATIVE
Leukocytes, UA: NEGATIVE
Nitrite, UA: NEGATIVE
Protein, UA: POSITIVE — AB
Spec Grav, UA: >=1.03 — AB (ref 1.010–1.025)
Urobilinogen, UA: 1 E.U./dL
pH, UA: 5 (ref 5.0–8.0)

## 2022-10-15 NOTE — Patient Instructions (Addendum)
Urine with blood but no obvious infection - culture sent and we will be in touch with results. Continue to drink plenty of fluids.  EKG today - showing coupling of heart beats consistent with pulse of 60s.

## 2022-10-15 NOTE — Progress Notes (Signed)
Ph: [phone] Fax: [phone]   Patient ID: Troy [name], male    DOB: 08/06/1945, 77 y.o.   MRN: [id number]  This visit was conducted in person.  BP 118/62   Pulse 66 Comment: coupling present  Temp (!) 97.4 F (36.3 C) (Temporal)   Ht 6' 0.5" (1.842 m)   Wt 234 lb 6 oz (106.3 kg)   SpO2 97%   BMI 31.35 kg/m   BP Readings from Last 3 Encounters:  10/15/22 118/62  02/04/22 110/66  12/18/21 102/68   Pulse Readings from Last 3 Encounters:  10/15/22 66  02/04/22 60  12/18/21 62    CC: UTI symptoms  Subjective:   HPI: Troy [name] is a 77 y.o. male presenting on 10/15/2022 for Urinary Frequency (C/o urinary frequency, feeling anxious and HA. Sxs started today. Pt accompanied by wife, Stanton Kidney. )   Patient of Dr Royden Purl with known history of RA, dementia on aspirin, plavix, aricept, namenda, trazodone, effexor, crestor and methotrexate presents with 1d h/o urinary urgency, frequency, headache and incidentally noted bradycardia. Woke up at 5am this morning with headache treated with tylenol 1000mg  without benefit. He drank a bottle of water which led to increased urination. Increased agitation noted as well. Took a nap and is now feeling better.   Denies dysuria, hematuria, abd pain, flank pain, fevers/chills, nausea/vomiting.  No recent UTI. No recent abx use.   Rheumatologist Dr Deanne Coffer at [facility].  Neurologist Dr Malvin Johns at [facility].  He is also followed by the [place].   H/o chronic headaches managed with tylenol PRN. He's not been taking venlafaxine because he hadn't had trouble with headaches recently.  Takes myrbetriq for urinary incontinence issues.   Sees cardiologist Dr Orpah Cobb in [place].  Known CAD s/p stents 12/2021.  No h/o bradycardia.  They noted pulse 35 at [place] recently.  Denies chest pain, dizziness, dyspnea, presyncope.      Relevant past medical, surgical, family and social history reviewed and updated as indicated. Interim medical history since our last  visit reviewed. Allergies and medications reviewed and updated. Outpatient Medications Prior to Visit  Medication Sig Dispense Refill   aspirin 81 MG chewable tablet Chew 1 tablet (81 mg total) by mouth daily. 30 tablet 1   ciclopirox (LOPROX) 0.77 % cream Apply topically 2 (two) times daily. 30 g 3   clopidogrel (PLAVIX) 75 MG tablet Take 1 tablet (75 mg total) by mouth daily with breakfast. 30 tablet 1   Elderberry 575 MG/5ML SYRP Take 5 mLs by mouth daily.     folic acid (FOLVITE) 1 MG tablet Take 1 mg by mouth daily.     memantine (NAMENDA) 10 MG tablet Take 1 tablet (10 mg total) by mouth 2 (two) times daily. 60 tablet 5   methotrexate (RHEUMATREX) 2.5 MG tablet Take 15 mg by mouth once a week. Take 6 tablets (15 mg) on Saturdays     mirabegron ER (MYRBETRIQ) 25 MG TB24 tablet Take 25 mg by mouth daily.     rosuvastatin (CRESTOR) 10 MG tablet Take 1 tablet (10 mg total) by mouth daily. 30 tablet 1   traZODone (DESYREL) 50 MG tablet Take 50 mg by mouth at bedtime.     venlafaxine XR (EFFEXOR-XR) 75 MG 24 hr capsule Take by mouth.     donepezil (ARICEPT) 5 MG tablet TAKE ONE TABLET BY MOUTH DAILY FOR MEMORY     donepezil (ARICEPT) 10 MG tablet Take 1 tablet (10 mg total) by mouth at  bedtime.     donepezil (ARICEPT) 5 MG tablet Take 5 mg by mouth daily.     No facility-administered medications prior to visit.     Per HPI unless specifically indicated in ROS section below Review of Systems  Objective:  BP 118/62   Pulse 66 Comment: coupling present  Temp (!) 97.4 F (36.3 C) (Temporal)   Ht 6' 0.5" (1.842 m)   Wt 234 lb 6 oz (106.3 kg)   SpO2 97%   BMI 31.35 kg/m   Wt Readings from Last 3 Encounters:  10/15/22 234 lb 6 oz (106.3 kg)  02/04/22 240 lb 8 oz (109.1 kg)  12/18/21 240 lb (108.9 kg)      Physical Exam Vitals and nursing note reviewed.  Constitutional:      Appearance: Normal appearance. He is not ill-appearing.  HENT:     Head: Normocephalic and atraumatic.      Mouth/Throat:     Mouth: Mucous membranes are moist.     Pharynx: Oropharynx is clear. No oropharyngeal exudate or posterior oropharyngeal erythema.  Cardiovascular:     Rate and Rhythm: Normal rate. Rhythm irregular. Extrasystoles are present.    Pulses: Normal pulses.     Heart sounds: Normal heart sounds. No murmur heard.    Comments: Couplets on auscultation Pulmonary:     Effort: Pulmonary effort is normal. No respiratory distress.     Breath sounds: Normal breath sounds. No wheezing, rhonchi or rales.  Abdominal:     General: Bowel sounds are normal. There is no distension.     Palpations: Abdomen is soft. There is no mass.     Tenderness: There is no abdominal tenderness. There is no right CVA tenderness, left CVA tenderness, guarding or rebound.     Hernia: No hernia is present.  Musculoskeletal:     Right lower leg: No edema.  Skin:    General: Skin is warm and dry.     Coloration: Skin is not jaundiced.     Findings: No rash.  Neurological:     Mental Status: He is alert.  Psychiatric:        Mood and Affect: Mood normal.        Behavior: Behavior normal.       Results for orders placed or performed in visit on 10/15/22  Urine Culture   Specimen: Urine  Result Value Ref Range   MICRO NUMBER: [id number]    SPECIMEN QUALITY: Adequate    Sample Source URINE    STATUS: FINAL    ISOLATE 1: Escherichia coli (A)       Susceptibility   Escherichia coli - URINE CULTURE, REFLEX    AMOX/CLAVULANIC <=2 Sensitive     AMPICILLIN <=2 Sensitive     AMPICILLIN/SULBACTAM <=2 Sensitive     CEFAZOLIN* <=4 Not Reportable      * For infections other than uncomplicated UTI caused by E. coli, K. pneumoniae or P. mirabilis: Cefazolin is resistant if MIC > or = 8 mcg/mL. (Distinguishing susceptible versus intermediate for isolates with MIC < or = 4 mcg/mL requires additional testing.) For uncomplicated UTI caused by E. coli, K. pneumoniae or P. mirabilis: Cefazolin is susceptible  if MIC <32 mcg/mL and predicts susceptible to the oral agents cefaclor, cefdinir, cefpodoxime, cefprozil, cefuroxime, cephalexin and loracarbef.     CEFTAZIDIME <=1 Sensitive     CEFEPIME <=1 Sensitive     CEFTRIAXONE <=1 Sensitive     CIPROFLOXACIN <=0.25 Sensitive     LEVOFLOXACIN <=0.12 Sensitive  GENTAMICIN <=1 Sensitive     IMIPENEM <=0.25 Sensitive     NITROFURANTOIN <=16 Sensitive     PIP/TAZO <=4 Sensitive     TOBRAMYCIN <=1 Sensitive     TRIMETH/SULFA* <=20 Sensitive      * For infections other than uncomplicated UTI caused by E. coli, K. pneumoniae or P. mirabilis: Cefazolin is resistant if MIC > or = 8 mcg/mL. (Distinguishing susceptible versus intermediate for isolates with MIC < or = 4 mcg/mL requires additional testing.) For uncomplicated UTI caused by E. coli, K. pneumoniae or P. mirabilis: Cefazolin is susceptible if MIC <32 mcg/mL and predicts susceptible to the oral agents cefaclor, cefdinir, cefpodoxime, cefprozil, cefuroxime, cephalexin and loracarbef. Legend: S = Susceptible  I = Intermediate R = Resistant  NS = Not susceptible * = Not tested  NR = Not reported **NN = See antimicrobic comments   POCT Urinalysis Dipstick (Automated)  Result Value Ref Range   Color, UA dark yellow    Clarity, UA clear    Glucose, UA Negative Negative   Bilirubin, UA 1+    Ketones, UA +/-    Spec Grav, UA >=1.030 (A) 1.010 - 1.025   Blood, UA 3+    pH, UA 5.0 5.0 - 8.0   Protein, UA Positive (A) Negative   Urobilinogen, UA 1.0 0.2 or 1.0 E.U./dL   Nitrite, UA negative    Leukocytes, UA Negative Negative  Micro:  WBC rare  RBC 10-20  Bact 1+  Casts none  Epi few  UCx sent   EKG - ventricular bigeminy, sinus rhythm 60s, LAD with LBBB, normal intervals, no acute ST/T changes  Assessment & Plan:   Problem List Items Addressed This Visit     Urinary frequency - Primary    1d h/o AMS (increase in agitation) associated with headache and polyuria. UA with  blood, UCx sent.  Await results prior to starting treatment.  Known h/o urinary incontinence managed with Myrbetriq      Relevant Orders   POCT Urinalysis Dipstick (Automated) (Completed)   Urine Culture (Completed)   Microscopic hematuria    H/o this, per prior note saw urology 2022.  Will suggest rpt UA in 2-3 wks after UTI treatment.       Dementia (HCC)    This complicates care. On aricept and namenda.       Relevant Medications   venlafaxine XR (EFFEXOR-XR) 75 MG 24 hr capsule   donepezil (ARICEPT) 10 MG tablet   Bradycardia    Update EKG today - anticipate ventricular bigeminy.  Sees cardiology Dr Algie Coffer in h/o CAD s/p stents.  Overall asxs from cardiac standpoint.       Relevant Orders   EKG 12-Lead (Completed)     No orders of the defined types were placed in this encounter.   Orders Placed This Encounter  Procedures   Urine Culture   POCT Urinalysis Dipstick (Automated)   EKG 12-Lead    Patient Instructions  Urine with blood but no obvious infection - culture sent and we will be in touch with results. Continue to drink plenty of fluids.  EKG today - showing coupling of heart beats consistent with pulse of 60s.   Follow up plan: Return if symptoms worsen or fail to improve.  Eustaquio Boyden, MD

## 2022-10-15 NOTE — Assessment & Plan Note (Addendum)
Update EKG today - anticipate ventricular bigeminy.  Sees cardiology Dr Algie Coffer in h/o CAD s/p stents.  Overall asxs from cardiac standpoint.

## 2022-10-15 NOTE — Assessment & Plan Note (Signed)
This complicates care. On aricept and namenda.

## 2022-10-16 ENCOUNTER — Encounter: Payer: Self-pay | Admitting: Family Medicine

## 2022-10-17 ENCOUNTER — Other Ambulatory Visit: Payer: Self-pay | Admitting: Family Medicine

## 2022-10-17 MED ORDER — CEPHALEXIN 500 MG PO CAPS: 500.0000 mg | ORAL_CAPSULE | Freq: Two times a day (BID) | ORAL | 0 refills | Status: AC

## 2022-10-17 NOTE — Telephone Encounter (Signed)
Spoke with pt's wife, Stanton Kidney (on dpr). (See Labs, Result Notes- 10/15/22.)

## 2022-10-18 LAB — URINE CULTURE
MICRO NUMBER:: 15038880
SPECIMEN QUALITY:: ADEQUATE

## 2022-10-21 ENCOUNTER — Other Ambulatory Visit: Payer: Self-pay | Admitting: Family Medicine

## 2022-10-21 DIAGNOSIS — R3129 Other microscopic hematuria: Secondary | ICD-10-CM

## 2022-10-21 NOTE — Assessment & Plan Note (Addendum)
1d h/o AMS (increase in agitation) associated with headache and polyuria. UA with blood, UCx sent.  Await results prior to starting treatment.  Known h/o urinary incontinence managed with Myrbetriq

## 2022-10-21 NOTE — Assessment & Plan Note (Signed)
H/o this, per prior note saw urology 2022.  Will suggest rpt UA in 2-3 wks after UTI treatment.

## 2022-10-23 ENCOUNTER — Ambulatory Visit (INDEPENDENT_AMBULATORY_CARE_PROVIDER_SITE_OTHER): Payer: PPO

## 2022-10-23 VITALS — Ht 75.0 in | Wt 236.0 lb

## 2022-10-23 DIAGNOSIS — Z0000 Encounter for general adult medical examination without abnormal findings: Secondary | ICD-10-CM

## 2022-10-23 NOTE — Patient Instructions (Signed)
Mr. Troy [name] , Thank you for taking time to come for your Medicare Wellness Visit. I appreciate your ongoing commitment to your health goals. Please review the following plan we discussed and let me know if I can assist you in the future.   These are the goals we discussed:  Goals      Increase physical activity     Starting 01/01/2017, I will continue to walk at 8,000 - 10,000 steps 5 days per week.      Patient Stated     Stay healthy     Patient Stated     Exercise more        This is a list of the screening recommended for you and due dates:  Health Maintenance  Topic Date Due   Zoster (Shingles) Vaccine (1 of 2) Never done   COVID-19 Vaccine (4 - 2023-24 season) 01/11/2022   Pneumonia Vaccine (2 of 2 - PPSV23 or PCV20) 01/11/2023*   Flu Shot  12/12/2022   Medicare Annual Wellness Visit  10/23/2023   DTaP/Tdap/Td vaccine (5 - Td or Tdap) 05/08/2030   Hepatitis C Screening  Completed   HPV Vaccine  Aged Out   Colon Cancer Screening  Discontinued  *Topic was postponed. The date shown is not the original due date.    Advanced directives: none  Conditions/risks identified: Aim for 30 minutes of exercise or brisk walking, 6-8 glasses of water, and 5 servings of fruits and vegetables each day.   Next appointment: Follow up in one year for your annual wellness visit. 10/27/23 @ 10:45 televisit  Preventive Care 65 Years and Older, Male  Preventive care refers to lifestyle choices and visits with your health care provider that can promote health and wellness. What does preventive care include? A yearly physical exam. This is also called an annual well check. Dental exams once or twice a year. Routine eye exams. Ask your health care provider how often you should have your eyes checked. Personal lifestyle choices, including: Daily care of your teeth and gums. Regular physical activity. Eating a healthy diet. Avoiding tobacco and drug use. Limiting alcohol use. Practicing  safe sex. Taking low doses of aspirin every day. Taking vitamin and mineral supplements as recommended by your health care provider. What happens during an annual well check? The services and screenings done by your health care provider during your annual well check will depend on your age, overall health, lifestyle risk factors, and family history of disease. Counseling  Your health care provider may ask you questions about your: Alcohol use. Tobacco use. Drug use. Emotional well-being. Home and relationship well-being. Sexual activity. Eating habits. History of falls. Memory and ability to understand (cognition). Work and work Astronomer. Screening  You may have the following tests or measurements: Height, weight, and BMI. Blood pressure. Lipid and cholesterol levels. These may be checked every 5 years, or more frequently if you are over 84 years old. Skin check. Lung cancer screening. You may have this screening every year starting at age 77 if you have a 30-pack-year history of smoking and currently smoke or have quit within the past 15 years. Fecal occult blood test (FOBT) of the stool. You may have this test every year starting at age 77. Flexible sigmoidoscopy or colonoscopy. You may have a sigmoidoscopy every 5 years or a colonoscopy every 10 years starting at age 104. Prostate cancer screening. Recommendations will vary depending on your family history and other risks. Hepatitis C blood test. Hepatitis B blood  test. Sexually transmitted disease (STD) testing. Diabetes screening. This is done by checking your blood sugar (glucose) after you have not eaten for a while (fasting). You may have this done every 1-3 years. Abdominal aortic aneurysm (AAA) screening. You may need this if you are a current or former smoker. Osteoporosis. You may be screened starting at age 77 if you are at high risk. Talk with your health care provider about your test results, treatment options, and  if necessary, the need for more tests. Vaccines  Your health care provider may recommend certain vaccines, such as: Influenza vaccine. This is recommended every year. Tetanus, diphtheria, and acellular pertussis (Tdap, Td) vaccine. You may need a Td booster every 10 years. Zoster vaccine. You may need this after age 77. Pneumococcal 13-valent conjugate (PCV13) vaccine. One dose is recommended after age 77. Pneumococcal polysaccharide (PPSV23) vaccine. One dose is recommended after age 77. Talk to your health care provider about which screenings and vaccines you need and how often you need them. This information is not intended to replace advice given to you by your health care provider. Make sure you discuss any questions you have with your health care provider. Document Released: 05/26/2015 Document Revised: 01/17/2016 Document Reviewed: 02/28/2015 Elsevier Interactive Patient Education  2017 [employer].  Fall Prevention in the Home Falls can cause injuries. They can happen to people of all ages. There are many things you can do to make your home safe and to help prevent falls. What can I do on the outside of my home? Regularly fix the edges of walkways and driveways and fix any cracks. Remove anything that might make you trip as you walk through a door, such as a raised step or threshold. Trim any bushes or trees on the path to your home. Use bright outdoor lighting. Clear any walking paths of anything that might make someone trip, such as rocks or tools. Regularly check to see if handrails are loose or broken. Make sure that both sides of any steps have handrails. Any raised decks and porches should have guardrails on the edges. Have any leaves, snow, or ice cleared regularly. Use sand or salt on walking paths during winter. Clean up any spills in your garage right away. This includes oil or grease spills. What can I do in the bathroom? Use night lights. Install grab bars by the  toilet and in the tub and shower. Do not use towel bars as grab bars. Use non-skid mats or decals in the tub or shower. If you need to sit down in the shower, use a plastic, non-slip stool. Keep the floor dry. Clean up any water that spills on the floor as soon as it happens. Remove soap buildup in the tub or shower regularly. Attach bath mats securely with double-sided non-slip rug tape. Do not have throw rugs and other things on the floor that can make you trip. What can I do in the bedroom? Use night lights. Make sure that you have a light by your bed that is easy to reach. Do not use any sheets or blankets that are too big for your bed. They should not hang down onto the floor. Have a firm chair that has side arms. You can use this for support while you get dressed. Do not have throw rugs and other things on the floor that can make you trip. What can I do in the kitchen? Clean up any spills right away. Avoid walking on wet floors. Keep items that  you use a lot in easy-to-reach places. If you need to reach something above you, use a strong step stool that has a grab bar. Keep electrical cords out of the way. Do not use floor polish or wax that makes floors slippery. If you must use wax, use non-skid floor wax. Do not have throw rugs and other things on the floor that can make you trip. What can I do with my stairs? Do not leave any items on the stairs. Make sure that there are handrails on both sides of the stairs and use them. Fix handrails that are broken or loose. Make sure that handrails are as long as the stairways. Check any carpeting to make sure that it is firmly attached to the stairs. Fix any carpet that is loose or worn. Avoid having throw rugs at the top or bottom of the stairs. If you do have throw rugs, attach them to the floor with carpet tape. Make sure that you have a light switch at the top of the stairs and the bottom of the stairs. If you do not have them, ask someone  to add them for you. What else can I do to help prevent falls? Wear shoes that: Do not have high heels. Have rubber bottoms. Are comfortable and fit you well. Are closed at the toe. Do not wear sandals. If you use a stepladder: Make sure that it is fully opened. Do not climb a closed stepladder. Make sure that both sides of the stepladder are locked into place. Ask someone to hold it for you, if possible. Clearly mark and make sure that you can see: Any grab bars or handrails. First and last steps. Where the edge of each step is. Use tools that help you move around (mobility aids) if they are needed. These include: Canes. Walkers. Scooters. Crutches. Turn on the lights when you go into a dark area. Replace any light bulbs as soon as they burn out. Set up your furniture so you have a clear path. Avoid moving your furniture around. If any of your floors are uneven, fix them. If there are any pets around you, be aware of where they are. Review your medicines with your doctor. Some medicines can make you feel dizzy. This can increase your chance of falling. Ask your doctor what other things that you can do to help prevent falls. This information is not intended to replace advice given to you by your health care provider. Make sure you discuss any questions you have with your health care provider. Document Released: 02/23/2009 Document Revised: 10/05/2015 Document Reviewed: 06/03/2014 Elsevier Interactive Patient Education  2017 [employer].

## 2022-10-23 NOTE — Progress Notes (Signed)
I connected with  [name] and wife Stanton Kidney on 10/23/22 by a audio enabled telemedicine application and verified that I am speaking with the correct person using two identifiers.  Patient Location: Home  Provider Location: Home Office  I discussed the limitations of evaluation and management by telemedicine. The patient expressed understanding and agreed to proceed.  Subjective:   Troy [name] is a 77 y.o. male who presents for Medicare Annual/Subsequent preventive examination.  Review of Systems     no Cardiac Risk Factors include: advanced age (>74men, >39 women);sedentary lifestyle;male gender     Objective:    Today's Vitals   10/23/22 1132  Weight: 236 lb (107 kg)  Height: 6\' 3"  (1.905 m)   Body mass index is 29.5 kg/m.     10/23/2022   11:44 AM 01/10/2022    9:18 AM 01/10/2022    9:11 AM 12/09/2021    3:14 AM 12/08/2021    5:36 PM 04/16/2019    7:54 AM 01/01/2017    8:45 AM  Advanced Directives  Does Patient Have a Medical Advance Directive? No No No  No No No  Would patient like information on creating a medical advance directive? No - Patient declined  No - Patient declined No - Patient declined       Current Medications (verified) Outpatient Encounter Medications as of 10/23/2022  Medication Sig   aspirin 81 MG chewable tablet Chew 1 tablet (81 mg total) by mouth daily.   cephALEXin (KEFLEX) 500 MG capsule Take 1 capsule (500 mg total) by mouth 2 (two) times daily for 7 days.   clopidogrel (PLAVIX) 75 MG tablet Take 1 tablet (75 mg total) by mouth daily with breakfast.   donepezil (ARICEPT) 10 MG tablet Take 1 tablet (10 mg total) by mouth at bedtime.   Elderberry 575 MG/5ML SYRP Take 5 mLs by mouth daily.   folic acid (FOLVITE) 1 MG tablet Take 1 mg by mouth daily.   memantine (NAMENDA) 10 MG tablet Take 1 tablet (10 mg total) by mouth 2 (two) times daily.   methotrexate (RHEUMATREX) 2.5 MG tablet Take 15 mg by mouth once a week. Take 6 tablets (15 mg) on  Saturdays   mirabegron ER (MYRBETRIQ) 25 MG TB24 tablet Take 25 mg by mouth daily.   rosuvastatin (CRESTOR) 10 MG tablet Take 1 tablet (10 mg total) by mouth daily.   traZODone (DESYREL) 50 MG tablet Take 50 mg by mouth at bedtime.   venlafaxine XR (EFFEXOR-XR) 75 MG 24 hr capsule Take by mouth.   ciclopirox (LOPROX) 0.77 % cream Apply topically 2 (two) times daily. (Patient not taking: Reported on 10/23/2022)   No facility-administered encounter medications on file as of 10/23/2022.    Allergies (verified) Chlorzoxazone and Elemental sulfur   History: History reviewed. No pertinent past medical history. Past Surgical History:  Procedure Laterality Date   BACK SURGERY  04/1998   CORONARY STENT INTERVENTION N/A 12/12/2021   Procedure: CORONARY STENT INTERVENTION;  Surgeon: Marykay Lex, MD;  Location: [facility] INVASIVE CV LAB;  Service: Cardiovascular;  Laterality: N/A;   LEFT HEART CATH AND CORONARY ANGIOGRAPHY N/A 12/12/2021   Procedure: LEFT HEART CATH AND CORONARY ANGIOGRAPHY;  Surgeon: Orpah Cobb, MD;  Location: [facility] INVASIVE CV LAB;  Service: Cardiovascular;  Laterality: N/A;   LIPOMA EXCISION  09/2009   ROTATOR CUFF REPAIR Left 2012   TONSILLECTOMY AND ADENOIDECTOMY  age 6   Family History  Problem Relation Age of Onset   Dementia Mother  Colon cancer Father 70   Social History   Socioeconomic History   Marital status: Married    Spouse name: Stanton Kidney   Number of children: 3   Years of education: Not on file   Highest education level: Not on file  Occupational History   Not on file  Tobacco Use   Smoking status: Never    Passive exposure: Past   Smokeless tobacco: Never  Vaping Use   Vaping Use: Never used  Substance and Sexual Activity   Alcohol use: No    Comment: quit over 39 years ago   Drug use: No   Sexual activity: Not on file  Other Topics Concern   Not on file  Social History Narrative   Right handed   Lives in single story home with wife   Social  Determinants of Health   Financial Resource Strain: Low Risk  (10/23/2022)   Overall Financial Resource Strain (CARDIA)    Difficulty of Paying Living Expenses: Not hard at all  Food Insecurity: No Food Insecurity (10/23/2022)   Hunger Vital Sign    Worried About Running Out of Food in the Last Year: Never true    Ran Out of Food in the Last Year: Never true  Transportation Needs: No Transportation Needs (10/23/2022)   PRAPARE - Administrator, Civil Service (Medical): No    Lack of Transportation (Non-Medical): No  Physical Activity: Inactive (10/23/2022)   Exercise Vital Sign    Days of Exercise per Week: 0 days    Minutes of Exercise per Session: 0 min  Stress: No Stress Concern Present (10/23/2022)   [employer] of Occupational Health - Occupational Stress Questionnaire    Feeling of Stress : Not at all  Social Connections: Moderately Integrated (10/23/2022)   Social Connection and Isolation Panel [NHANES]    Frequency of Communication with Friends and Family: More than three times a week    Frequency of Social Gatherings with Friends and Family: More than three times a week    Attends Religious Services: More than 4 times per year    Active Member of [employer] or Organizations: No    Attends Engineer, structural: Never    Marital Status: Married    Tobacco Counseling Counseling given: Not Answered   Clinical Intake:  Pre-visit preparation completed: Yes  Pain : No/denies pain     Nutritional Risks: None Diabetes: No  How often do you need to have someone help you when you read instructions, pamphlets, or other written materials from your doctor or pharmacy?: 1 - Never  Diabetic?no  Interpreter Needed?: No  Information entered by :: C.Annelise Mccoy LPN   Activities of Daily Living    10/23/2022   11:45 AM 01/10/2022    9:18 AM  In your present state of health, do you have any difficulty performing the following activities:  Hearing? 0 0   Vision? 0 0  Difficulty concentrating or making decisions? 1 1  Comment on medications   Walking or climbing stairs? 0 1  Dressing or bathing? 1 1  Comment wife   Doing errands, shopping? 1 1  Comment wife   Quarry manager and eating ? Malvin Johns  Comment wife   Using the Toilet? N N  In the past six months, have you accidently leaked urine? Y Y  Comment occasionally   Do you have problems with loss of bowel control? N N  Managing your Medications? Malvin Johns  Comment wife   Managing  your Finances? Malvin Johns  Housekeeping or managing your Housekeeping? Malvin Johns    Patient Care Team: Tower, Audrie Gallus, MD as PCP - General [place] Chard, DO as Consulting Physician (Neurology)  Indicate any recent Medical Services you may have received from other than [facility] providers in the past year (date may be approximate).     Assessment:   This is a routine wellness examination for Nello.  Hearing/Vision screen Hearing Screening - Comments:: No hearing issues  V[facility] Screening - Comments:: [facility] - [facility]  Dietary issues and exercise activities discussed: Current Exercise Habits: The patient does not participate in regular exercise at present, Exercise limited by: None identified   Goals Addressed             This Visit's Progress    Patient Stated       Exercise more       Depression Screen    10/23/2022   11:44 AM 10/15/2022   12:44 PM 01/10/2022    9:12 AM 10/12/2020   12:16 PM 09/24/2019    9:05 AM 01/01/2017    8:27 AM  PHQ 2/9 Scores  PHQ - 2 Score 0  2 2 0 0  PHQ- 9 Score   2 4  2   Exception Documentation  Medical reason        Fall Risk    10/23/2022   11:38 AM 01/10/2022    9:09 AM 01/10/2022    9:06 AM 12/18/2021   11:34 AM 01/09/2021   11:02 AM  Fall Risk   Falls in the past year? 0 0 0 0 1  Number falls in past yr: 0 0 0  1  Injury with Fall? 0 0 0  1  Risk for fall due to : No Fall Risks  Impaired balance/gait    Follow up Falls prevention discussed;Falls evaluation completed Falls  evaluation completed;Education provided;Falls prevention discussed Falls evaluation completed;Education provided;Falls prevention discussed Falls evaluation completed Falls evaluation completed    FALL RISK PREVENTION PERTAINING TO THE HOME:  Any stairs in or around the home? Yes  If so, are there any without handrails? No  Home free of loose throw rugs in walkways, pet beds, electrical cords, etc? Yes  Adequate lighting in your home to reduce risk of falls? Yes   ASSISTIVE DEVICES UTILIZED TO PREVENT FALLS:  Life alert? No  Use of a cane, walker or w/c? No  Grab bars in the bathroom? Yes  Shower chair or bench in shower? Yes  Elevated toilet seat or a handicapped toilet? Yes    Cognitive Function:    01/01/2017    8:27 AM  MMSE - Mini Mental State Exam  Orientation to time 5  Orientation to Place 5  Registration 3  Attention/ Calculation 0  Recall 3  Language- name 2 objects 0  Language- repeat 1  Language- follow 3 step command 3  Language- read & follow direction 0  Write a sentence 0  Copy design 0  Total score 20        10/23/2022   11:47 AM 01/10/2022    9:07 AM  6CIT Screen  What Year? 4 points 0 points  What month? 3 points 3 points  What time? 3 points 3 points  Count back from 20 2 points 0 points  Months in reverse 2 points 4 points  Repeat phrase 10 points 10 points  Total Score 24 points 20 points    Immunizations Immunization History  Administered Date(s) Administered  Fluad Quad(high Dose 65+) 02/11/2020, 01/09/2021, 02/04/2022   Influenza Whole 02/16/2007, 02/21/2010   Influenza, High Dose Seasonal PF 02/01/2019   Influenza,inj,Quad PF,6+ Mos 02/19/2017   Influenza-Unspecified 02/10/2018   PFIZER(Purple Top)SARS-COV-2 Vaccination 06/17/2019, 07/09/2019, 04/11/2020   Pneumococcal Conjugate-13 01/01/2017   Pneumococcal-Unspecified 01/12/2011   Td 11/22/2002, 05/08/2020   Tdap 01/11/2006, 01/26/2015    TDAP status: Up to date  Flu  Vaccine status: Up to date  Pneumococcal vaccine status: Up to date  Covid-19 vaccine status: Information provided on how to obtain vaccines.   Qualifies for Shingles Vaccine? Yes   Zostavax completed No   Shingrix Completed?: No.    Education has been provided regarding the importance of this vaccine. Patient has been advised to call insurance company to determine out of pocket expense if they have not yet received this vaccine. Advised may also receive vaccine at local pharmacy or Health Dept. Verbalized acceptance and understanding.  Screening Tests Health Maintenance  Topic Date Due   Zoster Vaccines- Shingrix (1 of 2) Never done   COVID-19 Vaccine (4 - 2023-24 season) 01/11/2022   Pneumonia Vaccine 39+ Years old (2 of 2 - PPSV23 or PCV20) 01/11/2023 (Originally 01/01/2018)   INFLUENZA VACCINE  12/12/2022   Medicare Annual Wellness (AWV)  10/23/2023   DTaP/Tdap/Td (5 - Td or Tdap) 05/08/2030   Hepatitis C Screening  Completed   HPV VACCINES  Aged Out   Colonoscopy  Discontinued    Health Maintenance  Health Maintenance Due  Topic Date Due   Zoster Vaccines- Shingrix (1 of 2) Never done   COVID-19 Vaccine (4 - 2023-24 season) 01/11/2022    Colorectal cancer screening: No longer required.   Lung Cancer Screening: (Low Dose CT Chest recommended if Age 95-80 years, 30 pack-year currently smoking OR have quit w/in 15years.) does not qualify.   Lung Cancer Screening Referral: no  Additional Screening:  Hepatitis C Screening: does qualify; Completed 01/01/17  Vision Screening: Recommended annual ophthalmology exams for early detection of glaucoma and other disorders of the eye. Is the patient up to date with their annual eye exam?  Yes  Who is the provider or what is the name of the office in which the patient attends annual eye exams? [facility] If pt is not established with a provider, would they like to be referred to a provider to establish care? No .   Dental Screening:  Recommended annual dental exams for proper oral hygiene  Community Resource Referral / Chronic Care Management: CRR required this visit?  No   CCM required this visit?  No      Plan:     I have personally reviewed and noted the following in the patient's chart:   Medical and social history Use of alcohol, tobacco or illicit drugs  Current medications and supplements including opioid prescriptions. Patient is not currently taking opioid prescriptions. Functional ability and status Nutritional status Physical activity Advanced directives List of other physicians Hospitalizations, surgeries, and ER visits in previous 12 months Vitals Screenings to include cognitive, depression, and falls Referrals and appointments  In addition, I have reviewed and discussed with patient certain preventive protocols, quality metrics, and best practice recommendations. A written personalized care plan for preventive services as well as general preventive health recommendations were provided to patient.     Maryan Puls, LPN   1/61/0960   Nurse Notes: 6CIT score 24

## 2022-10-24 DIAGNOSIS — H53149 Visual discomfort, unspecified: Secondary | ICD-10-CM | POA: Diagnosis not present

## 2022-10-24 DIAGNOSIS — F413 Other mixed anxiety disorders: Secondary | ICD-10-CM | POA: Diagnosis not present

## 2022-10-24 DIAGNOSIS — R519 Headache, unspecified: Secondary | ICD-10-CM | POA: Diagnosis not present

## 2022-10-24 DIAGNOSIS — R413 Other amnesia: Secondary | ICD-10-CM | POA: Diagnosis not present

## 2022-10-24 DIAGNOSIS — R4586 Emotional lability: Secondary | ICD-10-CM | POA: Diagnosis not present

## 2022-10-28 DIAGNOSIS — R350 Frequency of micturition: Secondary | ICD-10-CM | POA: Diagnosis not present

## 2022-10-28 DIAGNOSIS — R3121 Asymptomatic microscopic hematuria: Secondary | ICD-10-CM | POA: Diagnosis not present

## 2022-11-06 NOTE — Telephone Encounter (Signed)
He saw urology 10/28/2022 s/p CT scan, pending cystoscopy.

## 2022-11-20 DIAGNOSIS — R350 Frequency of micturition: Secondary | ICD-10-CM | POA: Diagnosis not present

## 2022-11-20 DIAGNOSIS — R351 Nocturia: Secondary | ICD-10-CM | POA: Diagnosis not present

## 2022-11-21 DIAGNOSIS — F02A0 Dementia in other diseases classified elsewhere, mild, without behavioral disturbance, psychotic disturbance, mood disturbance, and anxiety: Secondary | ICD-10-CM | POA: Diagnosis not present

## 2022-11-21 DIAGNOSIS — R519 Headache, unspecified: Secondary | ICD-10-CM | POA: Diagnosis not present

## 2022-11-21 DIAGNOSIS — R413 Other amnesia: Secondary | ICD-10-CM | POA: Diagnosis not present

## 2022-11-21 DIAGNOSIS — G309 Alzheimer's disease, unspecified: Secondary | ICD-10-CM | POA: Diagnosis not present

## 2022-12-09 DIAGNOSIS — Z13820 Encounter for screening for osteoporosis: Secondary | ICD-10-CM | POA: Diagnosis not present

## 2022-12-09 DIAGNOSIS — M0609 Rheumatoid arthritis without rheumatoid factor, multiple sites: Secondary | ICD-10-CM | POA: Diagnosis not present

## 2022-12-09 DIAGNOSIS — M25511 Pain in right shoulder: Secondary | ICD-10-CM | POA: Diagnosis not present

## 2022-12-09 DIAGNOSIS — Z79899 Other long term (current) drug therapy: Secondary | ICD-10-CM | POA: Diagnosis not present

## 2022-12-09 DIAGNOSIS — M25561 Pain in right knee: Secondary | ICD-10-CM | POA: Diagnosis not present

## 2022-12-09 DIAGNOSIS — R768 Other specified abnormal immunological findings in serum: Secondary | ICD-10-CM | POA: Diagnosis not present

## 2022-12-09 DIAGNOSIS — G3184 Mild cognitive impairment, so stated: Secondary | ICD-10-CM | POA: Diagnosis not present

## 2022-12-09 DIAGNOSIS — M1990 Unspecified osteoarthritis, unspecified site: Secondary | ICD-10-CM | POA: Diagnosis not present

## 2022-12-10 DIAGNOSIS — E7849 Other hyperlipidemia: Secondary | ICD-10-CM | POA: Diagnosis not present

## 2022-12-10 DIAGNOSIS — Z9861 Coronary angioplasty status: Secondary | ICD-10-CM | POA: Diagnosis not present

## 2022-12-10 DIAGNOSIS — I2510 Atherosclerotic heart disease of native coronary artery without angina pectoris: Secondary | ICD-10-CM | POA: Diagnosis not present

## 2022-12-10 DIAGNOSIS — I340 Nonrheumatic mitral (valve) insufficiency: Secondary | ICD-10-CM | POA: Diagnosis not present

## 2023-01-02 ENCOUNTER — Telehealth (INDEPENDENT_AMBULATORY_CARE_PROVIDER_SITE_OTHER): Payer: PPO | Admitting: Family Medicine

## 2023-01-02 ENCOUNTER — Telehealth: Payer: Self-pay | Admitting: *Deleted

## 2023-01-02 ENCOUNTER — Other Ambulatory Visit (INDEPENDENT_AMBULATORY_CARE_PROVIDER_SITE_OTHER): Payer: PPO

## 2023-01-02 DIAGNOSIS — R3129 Other microscopic hematuria: Secondary | ICD-10-CM | POA: Diagnosis not present

## 2023-01-02 DIAGNOSIS — R8290 Unspecified abnormal findings in urine: Secondary | ICD-10-CM

## 2023-01-02 DIAGNOSIS — R319 Hematuria, unspecified: Secondary | ICD-10-CM | POA: Diagnosis not present

## 2023-01-02 LAB — POC URINALSYSI DIPSTICK (AUTOMATED)
Bilirubin, UA: 1 — AB
Blood, UA: 200 — AB
Glucose, UA: NEGATIVE
Ketones, UA: 5 — AB
Leukocytes, UA: NEGATIVE
Nitrite, UA: NEGATIVE
Protein, UA: POSITIVE — AB
Spec Grav, UA: >=1.03 — AB (ref 1.010–1.025)
Urobilinogen, UA: 0.2 E.U./dL
pH, UA: 5.5 (ref 5.0–8.0)

## 2023-01-02 LAB — POCT UA - MICROSCOPIC ONLY

## 2023-01-02 MED ORDER — CEPHALEXIN 500 MG PO CAPS: 500.0000 mg | ORAL_CAPSULE | Freq: Two times a day (BID) | ORAL | 0 refills | Status: DC

## 2023-01-02 NOTE — Telephone Encounter (Signed)
-----   Message from [facility] sent at 01/02/2023  1:02 PM EDT ----- Urine is concentrated-needs more fluids if possible  Blood cells on micro with many bacteria as well  Culture is pending Please send in keflex 500 mg bid for 7 d  # 14 no refills to start   Noted on last urology note he has some friable mucosa/vessels around prostate and this could cause more blood also   If any sever symptoms go to ER Pending culture result for further plan

## 2023-01-02 NOTE — Telephone Encounter (Signed)
Patient wife called in and stated that patient has been sick and not able to move much. She stated that he has blood in his urine and was able to collect a sample. She was wanting to see if she could just drop that sample off to be tested as he is having a hard time getting around. Please advise. Thank you!

## 2023-01-02 NOTE — Telephone Encounter (Signed)
Spoke with wife and she said pt is acting okay. Pt did have a fever of 99.8 (highest it went) yesterday but today he doesn't have a fever. She said she did give him tylenol yesterday when he had a fever. Pt is eating fine (eating breakfast when I called) with no issues, no nausea or vomiting. Pt hasn't said he has any UTI sxs but he is acting weaker then normal and usually he goes to the bathroom during the night but last night he didn't use the bathroom all night so wife had pt use the bathroom in a urinal and that's when she saw the blood. Pt also has no other UTI sxs she asked pt if he had dysuria, back pain or abd pain while I was on the phone and pt answered "no" to all questions

## 2023-01-02 NOTE — Telephone Encounter (Signed)
Pt notified of Dr. Royden Purl comments they will drop off a urine sample asap

## 2023-01-02 NOTE — Telephone Encounter (Signed)
Please ask what his symptoms are (sick) wise first  ? Fever/ vomiting ?   Uti symptoms ?   Thanks

## 2023-01-02 NOTE — Telephone Encounter (Signed)
Thanks  He had e coli uti in June Please drop off clean catch (if possible) urine sample today -this am (I am not here this afternoon) for urinalysis and culture

## 2023-01-02 NOTE — Telephone Encounter (Signed)
Pt's wife notified of UA results and Dr. Royden Purl comments. ER precautions given and Rx sent to pharmacy

## 2023-01-02 NOTE — Addendum Note (Signed)
Addended by: Roxy Manns A on: 01/02/2023 01:05 PM   Modules accepted: Orders

## 2023-01-03 ENCOUNTER — Telehealth: Payer: Self-pay | Admitting: Family Medicine

## 2023-01-03 LAB — URINE CULTURE
MICRO NUMBER:: 15368237
Result:: NO GROWTH
SPECIMEN QUALITY:: ADEQUATE

## 2023-01-03 NOTE — Telephone Encounter (Signed)
Yes, that is fine  Let me know if there are new symptoms to report

## 2023-01-03 NOTE — Telephone Encounter (Signed)
Patient wife called in asking if it was ok to give patient some cough medicine being that he is already on a antibiotic.

## 2023-01-03 NOTE — Telephone Encounter (Signed)
Wife notified and will keeps [place] posted if he has any worsening sxs

## 2023-01-29 DIAGNOSIS — R351 Nocturia: Secondary | ICD-10-CM | POA: Diagnosis not present

## 2023-01-29 DIAGNOSIS — R350 Frequency of micturition: Secondary | ICD-10-CM | POA: Diagnosis not present

## 2023-03-03 DIAGNOSIS — F413 Other mixed anxiety disorders: Secondary | ICD-10-CM | POA: Diagnosis not present

## 2023-03-03 DIAGNOSIS — H53149 Visual discomfort, unspecified: Secondary | ICD-10-CM | POA: Diagnosis not present

## 2023-03-03 DIAGNOSIS — R413 Other amnesia: Secondary | ICD-10-CM | POA: Diagnosis not present

## 2023-03-03 DIAGNOSIS — R4586 Emotional lability: Secondary | ICD-10-CM | POA: Diagnosis not present

## 2023-03-03 DIAGNOSIS — R519 Headache, unspecified: Secondary | ICD-10-CM | POA: Diagnosis not present

## 2023-03-03 DIAGNOSIS — F02A0 Dementia in other diseases classified elsewhere, mild, without behavioral disturbance, psychotic disturbance, mood disturbance, and anxiety: Secondary | ICD-10-CM | POA: Diagnosis not present

## 2023-03-03 DIAGNOSIS — R296 Repeated falls: Secondary | ICD-10-CM | POA: Diagnosis not present

## 2023-03-03 DIAGNOSIS — G309 Alzheimer's disease, unspecified: Secondary | ICD-10-CM | POA: Diagnosis not present

## 2023-03-07 DIAGNOSIS — I2510 Atherosclerotic heart disease of native coronary artery without angina pectoris: Secondary | ICD-10-CM | POA: Diagnosis not present

## 2023-03-07 DIAGNOSIS — Z9861 Coronary angioplasty status: Secondary | ICD-10-CM | POA: Diagnosis not present

## 2023-03-07 DIAGNOSIS — I340 Nonrheumatic mitral (valve) insufficiency: Secondary | ICD-10-CM | POA: Diagnosis not present

## 2023-03-07 DIAGNOSIS — E7849 Other hyperlipidemia: Secondary | ICD-10-CM | POA: Diagnosis not present

## 2023-03-11 DIAGNOSIS — M0609 Rheumatoid arthritis without rheumatoid factor, multiple sites: Secondary | ICD-10-CM | POA: Diagnosis not present

## 2023-03-11 DIAGNOSIS — G3184 Mild cognitive impairment, so stated: Secondary | ICD-10-CM | POA: Diagnosis not present

## 2023-03-11 DIAGNOSIS — M1990 Unspecified osteoarthritis, unspecified site: Secondary | ICD-10-CM | POA: Diagnosis not present

## 2023-03-11 DIAGNOSIS — Z13820 Encounter for screening for osteoporosis: Secondary | ICD-10-CM | POA: Diagnosis not present

## 2023-03-11 DIAGNOSIS — R768 Other specified abnormal immunological findings in serum: Secondary | ICD-10-CM | POA: Diagnosis not present

## 2023-03-11 DIAGNOSIS — M25511 Pain in right shoulder: Secondary | ICD-10-CM | POA: Diagnosis not present

## 2023-03-11 DIAGNOSIS — Z79899 Other long term (current) drug therapy: Secondary | ICD-10-CM | POA: Diagnosis not present

## 2023-03-21 ENCOUNTER — Ambulatory Visit (INDEPENDENT_AMBULATORY_CARE_PROVIDER_SITE_OTHER): Payer: PPO | Admitting: Internal Medicine

## 2023-03-21 ENCOUNTER — Encounter: Payer: Self-pay | Admitting: Internal Medicine

## 2023-03-21 ENCOUNTER — Telehealth: Payer: Self-pay | Admitting: Family Medicine

## 2023-03-21 VITALS — BP 102/60 | HR 60 | Temp 97.9°F | Ht 72.5 in | Wt 220.0 lb

## 2023-03-21 DIAGNOSIS — R319 Hematuria, unspecified: Secondary | ICD-10-CM | POA: Diagnosis not present

## 2023-03-21 LAB — POC URINALSYSI DIPSTICK (AUTOMATED)
Glucose, UA: NEGATIVE
Nitrite, UA: NEGATIVE
Protein, UA: POSITIVE — AB
Spec Grav, UA: >=1.03 — AB (ref 1.010–1.025)
Urobilinogen, UA: 0.2 U/dL
pH, UA: 5 (ref 5.0–8.0)

## 2023-03-21 MED ORDER — CEPHALEXIN 500 MG PO CAPS: 500.0000 mg | ORAL_CAPSULE | Freq: Three times a day (TID) | ORAL | 0 refills | Status: DC

## 2023-03-21 NOTE — Telephone Encounter (Signed)
Appointment already set up

## 2023-03-21 NOTE — Telephone Encounter (Signed)
FYI: This call has been transferred to Access Nurse. Once the result note has been entered staff can address the message at that time.  Patient called in with the following symptoms:  Red Word: Bleeding from the penis   Please advise at Mobile [phone] (mobile)  Message is routed to Provider Pool and [facility] Triage   Pt's wife, Stanton Kidney, called stating when she was showering the pt, she saw blood around his penis. Stanton Kidney mentioned the pt has dementia. No other symptoms. Scheduled pt with Dr. Alphonsus Sias for today, 11/8 @ 12:30pm. Transferred to access nurse.

## 2023-03-21 NOTE — Telephone Encounter (Signed)
Per appt notes pt already has appt with Dr Alphonsus Sias on 03/21/23 at 12:30. Sending note to Dr Alphonsus Sias.

## 2023-03-21 NOTE — Assessment & Plan Note (Signed)
Blood seen from penis is presumably from the urine Urinalysis 3+ leuks and blood  Will treat empirically for UTI---though no specific urinary symptoms Send culture Cephalexin 500 tid x 7 days If blood persists, will need to see Dr Sherron Monday again

## 2023-03-21 NOTE — Telephone Encounter (Signed)
Okay---I will check him at the OV today

## 2023-03-21 NOTE — Addendum Note (Signed)
Addended by: Eual Fines on: 03/21/2023 01:09 PM   Modules accepted: Orders

## 2023-03-21 NOTE — Progress Notes (Signed)
Subjective:    Patient ID: Troy [name], male    DOB: May 29, 1945, 77 y.o.   MRN: [id number]  HPI Here with wife due to blood on his penis  Got up to go to bathroom this morning---had wet on himself some Wife changed him--and noticed some blood on the side of his leg Got him cleaned up in shower--and wife noted some blood at the end of his penis Seemed to have some blood in urine when wife got specimen---and again some at end of penis and in toilet  No dysuria Intermittent incontinence recently  Did see Dr Sherron Monday in July Had urinary urgency/frequency---thought to be anxiety related  Got gemtasa--but not taking now Told not a UTI No blood at that time Last cystoscopy some years ago  Current Outpatient Medications on File Prior to Visit  Medication Sig Dispense Refill   ciclopirox (LOPROX) 0.77 % cream Apply topically 2 (two) times daily. 30 g 3   clopidogrel (PLAVIX) 75 MG tablet Take 1 tablet (75 mg total) by mouth daily with breakfast. 30 tablet 1   donepezil (ARICEPT) 10 MG tablet Take 1 tablet (10 mg total) by mouth at bedtime.     Elderberry 575 MG/5ML SYRP Take 5 mLs by mouth daily.     folic acid (FOLVITE) 1 MG tablet Take 1 mg by mouth daily.     memantine (NAMENDA) 10 MG tablet Take 1 tablet (10 mg total) by mouth 2 (two) times daily. 60 tablet 5   methotrexate (RHEUMATREX) 2.5 MG tablet Take 15 mg by mouth once a week. Take 6 tablets (15 mg) on Saturdays     mirabegron ER (MYRBETRIQ) 50 MG TB24 tablet Take 1 tablet by mouth daily.     rosuvastatin (CRESTOR) 10 MG tablet Take 1 tablet (10 mg total) by mouth daily. 30 tablet 1   traZODone (DESYREL) 100 MG tablet Take 100 mg by mouth at bedtime.     venlafaxine XR (EFFEXOR-XR) 75 MG 24 hr capsule Take by mouth.     No current facility-administered medications on file prior to visit.    Allergies  Allergen Reactions   Chlorzoxazone     REACTION: dizzy   Elemental Sulfur Rash    History reviewed. No pertinent  past medical history.  Past Surgical History:  Procedure Laterality Date   BACK SURGERY  04/1998   CORONARY STENT INTERVENTION N/A 12/12/2021   Procedure: CORONARY STENT INTERVENTION;  Surgeon: Marykay Lex, MD;  Location: [facility] INVASIVE CV LAB;  Service: Cardiovascular;  Laterality: N/A;   LEFT HEART CATH AND CORONARY ANGIOGRAPHY N/A 12/12/2021   Procedure: LEFT HEART CATH AND CORONARY ANGIOGRAPHY;  Surgeon: Orpah Cobb, MD;  Location: [facility] INVASIVE CV LAB;  Service: Cardiovascular;  Laterality: N/A;   LIPOMA EXCISION  09/2009   ROTATOR CUFF REPAIR Left 2012   TONSILLECTOMY AND ADENOIDECTOMY  age 82    Family History  Problem Relation Age of Onset   Dementia Mother    Colon cancer Father 32    Social History   Socioeconomic History   Marital status: Married    Spouse name: Stanton Kidney   Number of children: 3   Years of education: Not on file   Highest education level: Not on file  Occupational History   Not on file  Tobacco Use   Smoking status: Never    Passive exposure: Past   Smokeless tobacco: Never  Vaping Use   Vaping status: Never Used  Substance and Sexual Activity   Alcohol  use: No    Comment: quit over 39 years ago   Drug use: No   Sexual activity: Not on file  Other Topics Concern   Not on file  Social History Narrative   Right handed   Lives in single story home with wife   Social Determinants of Health   Financial Resource Strain: Low Risk  (10/23/2022)   Overall Financial Resource Strain (CARDIA)    Difficulty of Paying Living Expenses: Not hard at all  Food Insecurity: No Food Insecurity (10/23/2022)   Hunger Vital Sign    Worried About Running Out of Food in the Last Year: Never true    Ran Out of Food in the Last Year: Never true  Transportation Needs: No Transportation Needs (10/23/2022)   PRAPARE - Administrator, Civil Service (Medical): No    Lack of Transportation (Non-Medical): No  Physical Activity: Inactive (10/23/2022)   Exercise  Vital Sign    Days of Exercise per Week: 0 days    Minutes of Exercise per Session: 0 min  Stress: No Stress Concern Present (10/23/2022)   [employer] of Occupational Health - Occupational Stress Questionnaire    Feeling of Stress : Not at all  Social Connections: Moderately Integrated (10/23/2022)   Social Connection and Isolation Panel [NHANES]    Frequency of Communication with Friends and Family: More than three times a week    Frequency of Social Gatherings with Friends and Family: More than three times a week    Attends Religious Services: More than 4 times per year    Active Member of [employer] or Organizations: No    Attends Banker Meetings: Never    Marital Status: Married  Catering manager Violence: Not At Risk (10/23/2022)   Humiliation, Afraid, Rape, and Kick questionnaire    Fear of Current or Ex-Partner: No    Emotionally Abused: No    Physically Abused: No    Sexually Abused: No   Review of Systems No fever No N/V No back pain No sex now Last masturbation about a week ago-no pain or blood in ejaculate   Objective:   Physical Exam Constitutional:      Appearance: Normal appearance.  Abdominal:     Palpations: Abdomen is soft.     Tenderness: There is no abdominal tenderness. There is no right CVA tenderness or left CVA tenderness.  Genitourinary:    Penis: Normal.      Testes: Normal.     Comments: Scrotum quiet Does have some blood at urethral meatus--but no evidence of trauma or inflammation there Neurological:     Mental Status: He is alert.            Assessment & Plan:

## 2023-03-22 LAB — URINE CULTURE
MICRO NUMBER:: 15706078
SPECIMEN QUALITY:: ADEQUATE

## 2023-04-22 DIAGNOSIS — D492 Neoplasm of unspecified behavior of bone, soft tissue, and skin: Secondary | ICD-10-CM | POA: Diagnosis not present

## 2023-04-22 DIAGNOSIS — L821 Other seborrheic keratosis: Secondary | ICD-10-CM | POA: Diagnosis not present

## 2023-04-22 DIAGNOSIS — L304 Erythema intertrigo: Secondary | ICD-10-CM | POA: Diagnosis not present

## 2023-04-22 DIAGNOSIS — L814 Other melanin hyperpigmentation: Secondary | ICD-10-CM | POA: Diagnosis not present

## 2023-04-22 DIAGNOSIS — D225 Melanocytic nevi of trunk: Secondary | ICD-10-CM | POA: Diagnosis not present

## 2023-04-22 DIAGNOSIS — L570 Actinic keratosis: Secondary | ICD-10-CM | POA: Diagnosis not present

## 2023-05-16 DIAGNOSIS — R4586 Emotional lability: Secondary | ICD-10-CM | POA: Diagnosis not present

## 2023-05-16 DIAGNOSIS — R296 Repeated falls: Secondary | ICD-10-CM | POA: Diagnosis not present

## 2023-05-16 DIAGNOSIS — F03B18 Unspecified dementia, moderate, with other behavioral disturbance: Secondary | ICD-10-CM | POA: Diagnosis not present

## 2023-05-16 DIAGNOSIS — H53149 Visual discomfort, unspecified: Secondary | ICD-10-CM | POA: Diagnosis not present

## 2023-05-16 DIAGNOSIS — R413 Other amnesia: Secondary | ICD-10-CM | POA: Diagnosis not present

## 2023-06-11 DIAGNOSIS — I2510 Atherosclerotic heart disease of native coronary artery without angina pectoris: Secondary | ICD-10-CM | POA: Diagnosis not present

## 2023-06-11 DIAGNOSIS — I340 Nonrheumatic mitral (valve) insufficiency: Secondary | ICD-10-CM | POA: Diagnosis not present

## 2023-06-11 DIAGNOSIS — E7849 Other hyperlipidemia: Secondary | ICD-10-CM | POA: Diagnosis not present

## 2023-06-11 DIAGNOSIS — Z9861 Coronary angioplasty status: Secondary | ICD-10-CM | POA: Diagnosis not present

## 2023-06-12 DIAGNOSIS — G3184 Mild cognitive impairment, so stated: Secondary | ICD-10-CM | POA: Diagnosis not present

## 2023-06-12 DIAGNOSIS — M1990 Unspecified osteoarthritis, unspecified site: Secondary | ICD-10-CM | POA: Diagnosis not present

## 2023-06-12 DIAGNOSIS — M25511 Pain in right shoulder: Secondary | ICD-10-CM | POA: Diagnosis not present

## 2023-06-12 DIAGNOSIS — M0609 Rheumatoid arthritis without rheumatoid factor, multiple sites: Secondary | ICD-10-CM | POA: Diagnosis not present

## 2023-06-12 DIAGNOSIS — Z79899 Other long term (current) drug therapy: Secondary | ICD-10-CM | POA: Diagnosis not present

## 2023-06-12 DIAGNOSIS — R768 Other specified abnormal immunological findings in serum: Secondary | ICD-10-CM | POA: Diagnosis not present

## 2023-06-12 DIAGNOSIS — Z13820 Encounter for screening for osteoporosis: Secondary | ICD-10-CM | POA: Diagnosis not present

## 2023-09-10 DIAGNOSIS — M1990 Unspecified osteoarthritis, unspecified site: Secondary | ICD-10-CM | POA: Diagnosis not present

## 2023-09-10 DIAGNOSIS — Z79899 Other long term (current) drug therapy: Secondary | ICD-10-CM | POA: Diagnosis not present

## 2023-09-10 DIAGNOSIS — M25511 Pain in right shoulder: Secondary | ICD-10-CM | POA: Diagnosis not present

## 2023-09-10 DIAGNOSIS — G3184 Mild cognitive impairment, so stated: Secondary | ICD-10-CM | POA: Diagnosis not present

## 2023-09-10 DIAGNOSIS — R768 Other specified abnormal immunological findings in serum: Secondary | ICD-10-CM | POA: Diagnosis not present

## 2023-09-10 DIAGNOSIS — Z13820 Encounter for screening for osteoporosis: Secondary | ICD-10-CM | POA: Diagnosis not present

## 2023-09-10 DIAGNOSIS — M0609 Rheumatoid arthritis without rheumatoid factor, multiple sites: Secondary | ICD-10-CM | POA: Diagnosis not present

## 2023-10-07 DIAGNOSIS — I340 Nonrheumatic mitral (valve) insufficiency: Secondary | ICD-10-CM | POA: Diagnosis not present

## 2023-10-07 DIAGNOSIS — E7849 Other hyperlipidemia: Secondary | ICD-10-CM | POA: Diagnosis not present

## 2023-10-07 DIAGNOSIS — I2510 Atherosclerotic heart disease of native coronary artery without angina pectoris: Secondary | ICD-10-CM | POA: Diagnosis not present

## 2023-10-07 DIAGNOSIS — Z9861 Coronary angioplasty status: Secondary | ICD-10-CM | POA: Diagnosis not present

## 2023-10-22 DIAGNOSIS — L708 Other acne: Secondary | ICD-10-CM | POA: Diagnosis not present

## 2023-10-22 DIAGNOSIS — L738 Other specified follicular disorders: Secondary | ICD-10-CM | POA: Diagnosis not present

## 2023-10-22 DIAGNOSIS — L304 Erythema intertrigo: Secondary | ICD-10-CM | POA: Diagnosis not present

## 2023-10-22 DIAGNOSIS — L570 Actinic keratosis: Secondary | ICD-10-CM | POA: Diagnosis not present

## 2023-10-27 ENCOUNTER — Ambulatory Visit (INDEPENDENT_AMBULATORY_CARE_PROVIDER_SITE_OTHER)

## 2023-10-27 VITALS — BP 102/60 | Ht 76.0 in | Wt 229.0 lb

## 2023-10-27 DIAGNOSIS — Z2821 Immunization not carried out because of patient refusal: Secondary | ICD-10-CM | POA: Diagnosis not present

## 2023-10-27 DIAGNOSIS — Z0000 Encounter for general adult medical examination without abnormal findings: Secondary | ICD-10-CM | POA: Diagnosis not present

## 2023-10-27 NOTE — Patient Instructions (Signed)
 Mr. Troy [name] , Thank you for taking time out of your busy schedule to complete your Annual Wellness Visit with me. I enjoyed our conversation and look forward to speaking with you again next year. I, as well as your care team,  appreciate your ongoing commitment to your health goals. Please review the following plan we discussed and let me know if I can assist you in the future. Your Game plan/ To Do List    Referrals: If you haven't heard from the office you've been referred to, please reach out to them at the phone provided.  None  Follow up Visits: Next Medicare AWV with our clinical staff: 10/29/2023   Have you seen your provider in the last 6 months (3 months if uncontrolled diabetes)? No Next Office Visit with your provider: n/a  Clinician Recommendations:  Aim for 30 minutes of exercise or brisk walking, 6-8 glasses of water, and 5 servings of fruits and vegetables each day.       This is a list of the screening recommended for you and due dates:  Health Maintenance  Topic Date Due   Zoster (Shingles) Vaccine (1 of 2) Never done   COVID-19 Vaccine (4 - 2024-25 season) 01/12/2023   Medicare Annual Wellness Visit  10/23/2023   Flu Shot  12/12/2023   DTaP/Tdap/Td vaccine (5 - Td or Tdap) 05/08/2030   Pneumococcal Vaccine for age over 70  Completed   Hepatitis C Screening  Completed   HPV Vaccine  Aged Out   Meningitis B Vaccine  Aged Out   Colon Cancer Screening  Discontinued    Advanced directives: (Copy Requested) Please bring a copy of your health care power of attorney and living will to the office to be added to your chart at your convenience. You can mail to [facility] [address] [place], [place] [address] or email to [email or web address] Advance Care Planning is important because it:  [x]  Makes sure you receive the medical care that is consistent with your values, goals, and preferences  [x]  It provides guidance to your family and loved ones  and reduces their decisional burden about whether or not they are making the right decisions based on your wishes.  Follow the link provided in your after visit summary or read over the paperwork we have mailed to you to help you started getting your Advance Directives in place. If you need assistance in completing these, please reach out to us  so that we can help you!  See attachments for Preventive Care and Fall Prevention Tips.

## 2023-10-27 NOTE — Progress Notes (Signed)
 Because this visit was a virtual/telehealth visit,  certain criteria was not obtained, such a blood pressure, CBG if applicable, and timed get up and go. Any medications not marked as taking were not mentioned during the medication reconciliation part of the visit. Any vitals not documented were not able to be obtained due to this being a telehealth visit or patient was unable to self-report a recent blood pressure reading due to a lack of equipment at home via telehealth. Vitals that have been documented are verbally provided by the patient.  This visit was performed by a medical professional under my direct supervision. I was immediately available for consultation/collaboration. I have reviewed and agree with the Annual Wellness Visit documentation.  Subjective:   Troy [name] is a 78 y.o. who presents for a Medicare Wellness preventive visit.  As a reminder, Annual Wellness Visits don't include a physical exam, and some assessments may be limited, especially if this visit is performed virtually. We may recommend an in-person follow-up visit with your provider if needed.  Visit Complete: Virtual I connected with  Troy [name] on 10/27/23 by a audio enabled telemedicine application and verified that I am speaking with the correct person using two identifiers.  Patient Location: Home  Provider Location: Home Office  I discussed the limitations of evaluation and management by telemedicine. The patient expressed understanding and agreed to proceed.  Vital Signs: Because this visit was a virtual/telehealth visit, some criteria may be missing or patient reported. Any vitals not documented were not able to be obtained and vitals that have been documented are patient reported.  VideoDeclined- This patient declined Librarian, academic. Therefore the visit was completed with audio only.  Persons Participating in Visit: Patient.  AWV Questionnaire: No: Patient Medicare  AWV questionnaire was not completed prior to this visit.  Cardiac Risk Factors include: advanced age (>27men, >8 women);male gender;dyslipidemia     Objective:    Today's Vitals   10/27/23 1510  BP: 102/60  Weight: 229 lb (103.9 kg)  Height: 6' 4 (1.93 m)   Body mass index is 27.87 kg/m.     10/27/2023    3:20 PM 10/23/2022   11:44 AM 01/10/2022    9:18 AM 01/10/2022    9:11 AM 12/09/2021    3:14 AM 12/08/2021    5:36 PM 04/16/2019    7:54 AM  Advanced Directives  Does Patient Have a Medical Advance Directive? Yes No No No  No No  Type of Estate agent of [place];Living will        Does patient want to make changes to medical advance directive? No - Patient declined        Copy of Healthcare Power of Attorney in Chart? No - copy requested        Would patient like information on creating a medical advance directive? No - Patient declined No - Patient declined  No - Patient declined No - Patient declined      Current Medications (verified) Outpatient Encounter Medications as of 10/27/2023  Medication Sig   ciclopirox  (LOPROX ) 0.77 % cream Apply topically 2 (two) times daily.   clopidogrel  (PLAVIX ) 75 MG tablet Take 1 tablet (75 mg total) by mouth daily with breakfast.   donepezil  (ARICEPT ) 10 MG tablet Take 1 tablet (10 mg total) by mouth at bedtime.   Elderberry 575 MG/5ML SYRP Take 5 mLs by mouth daily.   folic acid  (FOLVITE ) 1 MG tablet Take 1 mg by mouth  daily.   memantine  (NAMENDA ) 10 MG tablet Take 1 tablet (10 mg total) by mouth 2 (two) times daily.   methotrexate (RHEUMATREX) 2.5 MG tablet Take 15 mg by mouth once a week. Take 6 tablets (15 mg) on Saturdays   mirabegron  ER (MYRBETRIQ ) 50 MG TB24 tablet Take 1 tablet by mouth daily.   rosuvastatin  (CRESTOR ) 10 MG tablet Take 1 tablet (10 mg total) by mouth daily.   traZODone (DESYREL) 100 MG tablet Take 100 mg by mouth at bedtime.   venlafaxine XR (EFFEXOR-XR) 75 MG 24 hr capsule Take by mouth.    cephALEXin  (KEFLEX ) 500 MG capsule Take 1 capsule (500 mg total) by mouth 3 (three) times daily.   No facility-administered encounter medications on file as of 10/27/2023.    Allergies (verified) Chlorzoxazone and Elemental sulfur   History: History reviewed. No pertinent past medical history. Past Surgical History:  Procedure Laterality Date   BACK SURGERY  04/1998   CORONARY STENT INTERVENTION N/A 12/12/2021   Procedure: CORONARY STENT INTERVENTION;  Surgeon: Arleen Lacer, MD;  Location: [facility] INVASIVE CV LAB;  Service: Cardiovascular;  Laterality: N/A;   LEFT HEART CATH AND CORONARY ANGIOGRAPHY N/A 12/12/2021   Procedure: LEFT HEART CATH AND CORONARY ANGIOGRAPHY;  Surgeon: Pasqual Bone, MD;  Location: [facility] INVASIVE CV LAB;  Service: Cardiovascular;  Laterality: N/A;   LIPOMA EXCISION  09/2009   ROTATOR CUFF REPAIR Left 2012   TONSILLECTOMY AND ADENOIDECTOMY  age 79   Family History  Problem Relation Age of Onset   Dementia Mother    Colon cancer Father 108   Social History   Socioeconomic History   Marital status: Married    Spouse name: Abran Abrahams   Number of children: 3   Years of education: Not on file   Highest education level: Not on file  Occupational History   Not on file  Tobacco Use   Smoking status: Never    Passive exposure: Past   Smokeless tobacco: Never  Vaping Use   Vaping status: Never Used  Substance and Sexual Activity   Alcohol use: No    Comment: quit over 39 years ago   Drug use: No   Sexual activity: Not on file  Other Topics Concern   Not on file  Social History Narrative   Right handed   Lives in single story home with wife   Social Drivers of Health   Financial Resource Strain: Low Risk  (10/27/2023)   Overall Financial Resource Strain (CARDIA)    Difficulty of Paying Living Expenses: Not hard at all  Food Insecurity: No Food Insecurity (10/27/2023)   Hunger Vital Sign    Worried About Running Out of Food in the Last Year: Never true    Ran  Out of Food in the Last Year: Never true  Transportation Needs: No Transportation Needs (10/27/2023)   PRAPARE - Administrator, Civil Service (Medical): No    Lack of Transportation (Non-Medical): No  Physical Activity: Inactive (10/27/2023)   Exercise Vital Sign    Days of Exercise per Week: 0 days    Minutes of Exercise per Session: 0 min  Stress: No Stress Concern Present (10/27/2023)   [employer] of Occupational Health - Occupational Stress Questionnaire    Feeling of Stress: Not at all  Social Connections: Moderately Integrated (10/27/2023)   Social Connection and Isolation Panel    Frequency of Communication with Friends and Family: More than three times a week    Frequency of  Social Gatherings with Friends and Family: More than three times a week    Attends Religious Services: More than 4 times per year    Active Member of [employer] or Organizations: No    Attends Engineer, structural: Never    Marital Status: Married    Tobacco Counseling Counseling given: Not Answered    Clinical Intake:  Pre-visit preparation completed: Yes  Pain : No/denies pain     BMI - recorded: 27.87 Nutritional Status: BMI 25 -29 Overweight Nutritional Risks: None Diabetes: No  Lab Results  Component Value Date   HGBA1C 5.6 02/04/2022   HGBA1C 5.7 01/02/2021   HGBA1C 5.5 09/14/2019     How often do you need to have someone help you when you read instructions, pamphlets, or other written materials from your doctor or pharmacy?: 1 - Never  Interpreter Needed?: No  Information entered by :: Juliann Ochoa   Activities of Daily Living     10/27/2023    3:18 PM  In your present state of health, do you have any difficulty performing the following activities:  Hearing? 0  Vision? 0  Difficulty concentrating or making decisions? 0  Walking or climbing stairs? 0  Dressing or bathing? 1  Comment some trouble bathing  Doing errands, shopping? 0  Preparing  Food and eating ? Y  Comment wife cooks  Using the Toilet? N  In the past six months, have you accidently leaked urine? Y  Do you have problems with loss of bowel control? N  Managing your Medications? N  Managing your Finances? N  Housekeeping or managing your Housekeeping? N    Patient Care Team: Tower, Manley Seeds, MD as PCP - General Patel, Donika K, DO as Consulting Physician (Neurology) Erman Hayward, MD as Consulting Physician (Urology)  I have updated your Care Teams any recent Medical Services you may have received from other providers in the past year.     Assessment:   This is a routine wellness examination for Troy [name].  Hearing/Vision screen Hearing Screening - Comments:: Patient has no difficulties hearing  Vision Screening - Comments:: Patient wears glasses   Goals Addressed             This Visit's Progress    Patient Stated   On track    Stay healthy       Depression Screen     10/27/2023    3:21 PM 10/23/2022   11:44 AM 10/15/2022   12:44 PM 01/10/2022    9:12 AM 10/12/2020   12:16 PM 09/24/2019    9:05 AM 01/01/2017    8:27 AM  PHQ 2/9 Scores  PHQ - 2 Score 0 0  2 2 0 0  PHQ- 9 Score 1   2 4  2   Exception Documentation   Medical reason        Fall Risk     10/27/2023    3:20 PM 10/23/2022   11:38 AM 01/10/2022    9:09 AM 01/10/2022    9:06 AM 12/18/2021   11:34 AM  Fall Risk   Falls in the past year? 1 0 0 0 0  Number falls in past yr: 1 0 0 0   Injury with Fall? 1 0 0 0   Risk for fall due to : History of fall(s);Impaired balance/gait;Impaired mobility No Fall Risks  Impaired balance/gait   Follow up Falls evaluation completed Falls prevention discussed;Falls evaluation completed Falls evaluation completed;Education provided;Falls prevention discussed  Falls evaluation completed;Education provided;Falls  prevention discussed  Falls evaluation completed      Data saved with a previous flowsheet row definition    MEDICARE RISK AT HOME:  Medicare  Risk at Home Any stairs in or around the home?: Yes If so, are there any without handrails?: No Home free of loose throw rugs in walkways, pet beds, electrical cords, etc?: Yes Adequate lighting in your home to reduce risk of falls?: Yes Life alert?: No Use of a cane, walker or w/c?: Yes (walker) Grab bars in the bathroom?: Yes Shower chair or bench in shower?: Yes Elevated toilet seat or a handicapped toilet?: Yes  TIMED UP AND GO:  Was the test performed?  No  Cognitive Function: 6CIT completed    01/01/2017    8:27 AM  MMSE - Mini Mental State Exam  Orientation to time 5   Orientation to Place 5   Registration 3   Attention/ Calculation 0   Recall 3   Language- name 2 objects 0   Language- repeat 1  Language- follow 3 step command 3   Language- read & follow direction 0   Write a sentence 0   Copy design 0   Total score 20      Data saved with a previous flowsheet row definition        10/27/2023    3:17 PM 10/23/2022   11:47 AM 01/10/2022    9:07 AM  6CIT Screen  What Year? 4 points 4 points 0 points  What month? 3 points 3 points 3 points  What time? 3 points 3 points 3 points  Count back from 20 4 points 2 points 0 points  Months in reverse 4 points 2 points 4 points  Repeat phrase 10 points 10 points 10 points  Total Score 28 points 24 points 20 points    Immunizations Immunization History  Administered Date(s) Administered   Fluad Quad(high Dose 65+) 02/11/2020, 01/09/2021, 02/04/2022   Influenza Whole 02/16/2007, 02/21/2010   Influenza, High Dose Seasonal PF 02/01/2019, 02/27/2023   Influenza,inj,Quad PF,6+ Mos 02/19/2017   Influenza-Unspecified 02/10/2018, 02/04/2022   PFIZER(Purple Top)SARS-COV-2 Vaccination 06/17/2019, 07/09/2019, 04/11/2020   Pneumococcal Conjugate-13 01/01/2017, 02/01/2017   Pneumococcal Polysaccharide-23 01/12/2011   Pneumococcal-Unspecified 01/12/2011   Td 11/22/2002, 05/08/2020   Tdap 01/11/2006, 01/26/2015    Screening  Tests Health Maintenance  Topic Date Due   Zoster Vaccines- Shingrix (1 of 2) Never done   COVID-19 Vaccine (4 - 2024-25 season) 01/12/2023   INFLUENZA VACCINE  12/12/2023   Medicare Annual Wellness (AWV)  10/26/2024   DTaP/Tdap/Td (5 - Td or Tdap) 05/08/2030   Pneumococcal Vaccine: 50+ Years  Completed   Hepatitis C Screening  Completed   HPV VACCINES  Aged Out   Meningococcal B Vaccine  Aged Out   Colonoscopy  Discontinued    Health Maintenance  Health Maintenance Due  Topic Date Due   Zoster Vaccines- Shingrix (1 of 2) Never done   COVID-19 Vaccine (4 - 2024-25 season) 01/12/2023   Health Maintenance Items Addressed: Patient declined vaccination  Additional Screening:  Vision Screening: Recommended annual ophthalmology exams for early detection of glaucoma and other disorders of the eye. Would you like a referral to an eye doctor? No    Dental Screening: Recommended annual dental exams for proper oral hygiene  Community Resource Referral / Chronic Care Management: CRR required this visit?  No   CCM required this visit?  No   Plan:    I have personally reviewed and noted the following  in the patient's chart:   Medical and social history Use of alcohol, tobacco or illicit drugs  Current medications and supplements including opioid prescriptions. Patient is not currently taking opioid prescriptions. Functional ability and status Nutritional status Physical activity Advanced directives List of other physicians Hospitalizations, surgeries, and ER visits in previous 12 months Vitals Screenings to include cognitive, depression, and falls Referrals and appointments  In addition, I have reviewed and discussed with patient certain preventive protocols, quality metrics, and best practice recommendations. A written personalized care plan for preventive services as well as general preventive health recommendations were provided to patient.   Freeda Jerry,  [place]   10/27/2023   After Visit Summary: (MyChart) Due to this being a telephonic visit, the after visit summary with patients personalized plan was offered to patient via MyChart   Notes: Nothing significant to report at this time.

## 2023-11-12 DIAGNOSIS — R413 Other amnesia: Secondary | ICD-10-CM | POA: Diagnosis not present

## 2023-12-10 DIAGNOSIS — R768 Other specified abnormal immunological findings in serum: Secondary | ICD-10-CM | POA: Diagnosis not present

## 2023-12-10 DIAGNOSIS — M25511 Pain in right shoulder: Secondary | ICD-10-CM | POA: Diagnosis not present

## 2023-12-10 DIAGNOSIS — Z79899 Other long term (current) drug therapy: Secondary | ICD-10-CM | POA: Diagnosis not present

## 2023-12-10 DIAGNOSIS — M1990 Unspecified osteoarthritis, unspecified site: Secondary | ICD-10-CM | POA: Diagnosis not present

## 2023-12-10 DIAGNOSIS — G3184 Mild cognitive impairment, so stated: Secondary | ICD-10-CM | POA: Diagnosis not present

## 2023-12-10 DIAGNOSIS — M0609 Rheumatoid arthritis without rheumatoid factor, multiple sites: Secondary | ICD-10-CM | POA: Diagnosis not present

## 2024-02-26 ENCOUNTER — Encounter: Payer: Self-pay | Admitting: Pharmacist

## 2024-02-26 NOTE — Progress Notes (Signed)
 Pharmacy Quality Measure Review  This patient is appearing on a report for being at risk of failing the adherence measure for cholesterol (statin) medications this calendar year.   Medication: rosuvastatin  10 mg Last fill date: 10/20/23 for 90 day supply  Insurance report was not up to date. No action needed at this time.  Medication has been refilled as of 02/11/24 x90 ds. Adh = 80%.

## 2024-03-11 DIAGNOSIS — M0609 Rheumatoid arthritis without rheumatoid factor, multiple sites: Secondary | ICD-10-CM | POA: Diagnosis not present

## 2024-03-11 DIAGNOSIS — Z13820 Encounter for screening for osteoporosis: Secondary | ICD-10-CM | POA: Diagnosis not present

## 2024-03-11 DIAGNOSIS — M1990 Unspecified osteoarthritis, unspecified site: Secondary | ICD-10-CM | POA: Diagnosis not present

## 2024-03-11 DIAGNOSIS — R760 Raised antibody titer: Secondary | ICD-10-CM | POA: Diagnosis not present

## 2024-03-11 DIAGNOSIS — Z79899 Other long term (current) drug therapy: Secondary | ICD-10-CM | POA: Diagnosis not present

## 2024-03-11 DIAGNOSIS — M25511 Pain in right shoulder: Secondary | ICD-10-CM | POA: Diagnosis not present

## 2024-03-25 DIAGNOSIS — R350 Frequency of micturition: Secondary | ICD-10-CM | POA: Diagnosis not present

## 2024-03-25 DIAGNOSIS — N3281 Overactive bladder: Secondary | ICD-10-CM | POA: Diagnosis not present

## 2024-03-25 DIAGNOSIS — R351 Nocturia: Secondary | ICD-10-CM | POA: Diagnosis not present

## 2024-03-25 DIAGNOSIS — R399 Unspecified symptoms and signs involving the genitourinary system: Secondary | ICD-10-CM | POA: Diagnosis not present

## 2024-04-21 ENCOUNTER — Ambulatory Visit: Payer: Self-pay

## 2024-04-21 NOTE — Telephone Encounter (Signed)
 Aware, will watch for correspondence Agree with that disposition

## 2024-04-21 NOTE — Telephone Encounter (Signed)
 No openings at PCP office or the surrounding offices within the region Patient's wife states she and her daughter will take the patient to a nearby Urgent Care   FYI Only or Action Required?: FYI only for provider: Urgent Care due to no openings in PCP office or surrounding PCP offices within the region.  Patient was last seen in primary care on 03/21/2023 by Jimmy Charlie FERNS, MD.  Called Nurse Triage reporting Hematuria.  Symptoms began today.  Interventions attempted: Nothing.  Symptoms are: unchanged.  Triage Disposition: See HCP Within 4 Hours (Or PCP Triage)  Patient/caregiver understands and will follow disposition?: [facility]               Copied from CRM #[id number]. Topic: Clinical - Red Word Triage >> Apr 21, 2024  9:32 AM Thersia BROCKS wrote: [facility] that prompted transfer to Nurse Triage: Patient wife called in stated patient has dementia, wife stated he has blood in his stool and depends Reason for Disposition  [1] Pain or burning with passing urine AND [2] side (flank) or back pain present  Answer Assessment - Initial Assessment Questions Patient's wife called and advised that the patient had blood in his depends and she states that he was frequently going to the bathroom a week ago. Patient is normotensive Patient has complained off and on about back pain Wife denied any known injuries She states he is not having any major complaints at this time Wife states that she will take the patient to Urgent Care---she is going to call her daughter to come help her.  Patient is advised to call us  back if anything changes or with any further questions/concerns. Patient is advised that if anything worsens to go to the Emergency Room. Patient verbalized understanding.    2. ONSET: When did the bleeding start?      It was noticed today in patient's depends and in the toilet water 5. FEVER: Do you have a fever? If Yes, ask: What is your temperature, how was  it measured, and when did it start?     Wife denies--she took his temp this morning and it was fine 6. ASSOCIATED SYMPTOMS: Are you passing urine more frequently than usual?     Frequency  7. OTHER SYMPTOMS: Do you have any other symptoms? (e.g., back/flank pain, abdomen pain, vomiting)     Off and on back pain  Protocols used: Urine - Blood In-A-AH

## 2024-04-25 ENCOUNTER — Emergency Department (HOSPITAL_COMMUNITY)
Admission: EM | Admit: 2024-04-25 | Discharge: 2024-04-25 | Disposition: A | Discharge status: Home / Self Care | Attending: Emergency Medicine | Admitting: Emergency Medicine

## 2024-04-25 ENCOUNTER — Emergency Department (HOSPITAL_COMMUNITY)

## 2024-04-25 ENCOUNTER — Encounter (HOSPITAL_COMMUNITY): Payer: Self-pay

## 2024-04-25 ENCOUNTER — Other Ambulatory Visit: Payer: Self-pay

## 2024-04-25 DIAGNOSIS — R1013 Epigastric pain: Secondary | ICD-10-CM | POA: Diagnosis not present

## 2024-04-25 DIAGNOSIS — Z7902 Long term (current) use of antithrombotics/antiplatelets: Secondary | ICD-10-CM | POA: Diagnosis not present

## 2024-04-25 DIAGNOSIS — R1011 Right upper quadrant pain: Secondary | ICD-10-CM | POA: Insufficient documentation

## 2024-04-25 DIAGNOSIS — R0789 Other chest pain: Secondary | ICD-10-CM | POA: Diagnosis not present

## 2024-04-25 DIAGNOSIS — R3129 Other microscopic hematuria: Secondary | ICD-10-CM

## 2024-04-25 DIAGNOSIS — R319 Hematuria, unspecified: Secondary | ICD-10-CM | POA: Diagnosis not present

## 2024-04-25 LAB — CBC WITH DIFFERENTIAL/PLATELET
Abs Immature Granulocytes: 0.02 K/uL (ref 0.00–0.07)
Basophils Absolute: 0 K/uL (ref 0.0–0.1)
Basophils Relative: 0 %
Eosinophils Absolute: 0.1 K/uL (ref 0.0–0.5)
Eosinophils Relative: 2 %
HCT: 42.4 % (ref 39.0–52.0)
Hemoglobin: 14.4 g/dL (ref 13.0–17.0)
Immature Granulocytes: 0 %
Lymphocytes Relative: 7 %
Lymphs Abs: 0.6 K/uL — ABNORMAL LOW (ref 0.7–4.0)
MCH: 31.4 pg (ref 26.0–34.0)
MCHC: 34 g/dL (ref 30.0–36.0)
MCV: 92.6 fL (ref 80.0–100.0)
Monocytes Absolute: 0.6 K/uL (ref 0.1–1.0)
Monocytes Relative: 8 %
Neutro Abs: 6.5 K/uL (ref 1.7–7.7)
Neutrophils Relative %: 83 %
Platelets: 159 K/uL (ref 150–400)
RBC: 4.58 MIL/uL (ref 4.22–5.81)
RDW: 12.4 % (ref 11.5–15.5)
WBC: 7.8 K/uL (ref 4.0–10.5)
nRBC: 0 % (ref 0.0–0.2)

## 2024-04-25 LAB — COMPREHENSIVE METABOLIC PANEL WITH GFR
ALT: 23 U/L (ref 0–44)
AST: 26 U/L (ref 15–41)
Albumin: 3.4 g/dL — ABNORMAL LOW (ref 3.5–5.0)
Alkaline Phosphatase: 76 U/L (ref 38–126)
Anion gap: 9 (ref 5–15)
BUN: 23 mg/dL (ref 8–23)
CO2: 29 mmol/L (ref 22–32)
Calcium: 8.2 mg/dL — ABNORMAL LOW (ref 8.9–10.3)
Chloride: 102 mmol/L (ref 98–111)
Creatinine, Ser: 1.41 mg/dL — ABNORMAL HIGH (ref 0.61–1.24)
GFR, Estimated: 51 mL/min — ABNORMAL LOW (ref >60)
Glucose, Bld: 109 mg/dL — ABNORMAL HIGH (ref 70–99)
Potassium: 4 mmol/L (ref 3.5–5.1)
Sodium: 140 mmol/L (ref 135–145)
Total Bilirubin: 0.9 mg/dL (ref 0.0–1.2)
Total Protein: 5.6 g/dL — ABNORMAL LOW (ref 6.5–8.1)

## 2024-04-25 LAB — URINALYSIS, W/ REFLEX TO CULTURE (INFECTION SUSPECTED)
Bacteria, UA: NONE SEEN
Bilirubin Urine: NEGATIVE
Glucose, UA: NEGATIVE mg/dL
Ketones, ur: NEGATIVE mg/dL
Leukocytes,Ua: NEGATIVE
Nitrite: NEGATIVE
Protein, ur: NEGATIVE mg/dL
Specific Gravity, Urine: 1.019 (ref 1.005–1.030)
pH: 6 (ref 5.0–8.0)

## 2024-04-25 LAB — LIPASE, BLOOD: Lipase: 37 U/L (ref 11–51)

## 2024-04-25 MED ORDER — IOHEXOL 350 MG/ML SOLN
75.0000 mL | Freq: Once | INTRAVENOUS | Status: AC | PRN
  Administered 2024-04-25: 75 mL via INTRAVENOUS

## 2024-04-25 NOTE — Discharge Instructions (Addendum)
 Thank you for coming to [facility] Emergency Department. You were seen for abdominal pain. We did an exam, labs, and imaging, and these showed no acute findings. You can take tylenol  1,000 mg every 8 hours for pain.  Please follow up with your primary care provider within 1 week.   You do have a very small amount of blood in the urine. Please follow up with Dr. Gaston your urologist within 1-2 weeks.   Do not hesitate to return to the ED or call 911 if you experience: -Worsening symptoms -Nausea/vomiting so severe you cannot eat/drink anything -Lightheadedness, passing out -Fevers/chills -Anything else that concerns you

## 2024-04-25 NOTE — ED Notes (Signed)
Pt ambulated to restroom with walker and assistance

## 2024-04-25 NOTE — ED Notes (Signed)
 Patient transported to [place]

## 2024-04-25 NOTE — ED Notes (Signed)
 Patient to CT.

## 2024-04-25 NOTE — ED Notes (Signed)
 Patient back from CT

## 2024-04-25 NOTE — ED Notes (Signed)
 Pt ambulated to restroom with assistance.

## 2024-04-25 NOTE — ED Triage Notes (Signed)
 Pt coming from home for evaluation of right upper quadrant pain that began yesterday according to wife; pt has hx of frequent uti's. Upon palpation, RUQ is soft and extremely tender. Pt alert and confused per baseline

## 2024-04-25 NOTE — ED Provider Notes (Signed)
 [place] [facility] Provider Note   CSN: [id number] Arrival date & time: 04/25/24  0701     History {Add pertinent medical, surgical, social history, OB history to HPI:1} Chief Complaint  Patient presents with   Abdominal Pain    Troy [name] is a 78 y.o. male with PMH as listed below who presents with wife coming from home for evaluation of right upper quadrant pain that began this morning according to wife; pt has hx of frequent uti's.  Wife states that patient and she live at home alone. She has not heard him fall or seen him to have any falls or abdominal/chest trauma.  He does typically have constipation, unsure if he has had constipation or diarrhea lately.  On Wednesday he had some blood in his urine and they went to urgent care to have it evaluated but stated that the UA and urine culture were normal at that time.  He has complained of some pain in his back but she is not exactly sure the specifics of that. Had T 99.26F but no distinct fevers.  Patient cannot give history, A&Ox1, reports that he feels well but does have some RUQ pain. No nausea/vomiting. He does have an epigastric ventral hernia that is typically out per the wife and he does have some pain there as well.  History reviewed. No pertinent past medical history.     Home Medications Prior to Admission medications  Medication Sig Start Date End Date Taking? Authorizing Provider  cephALEXin  (KEFLEX ) 500 MG capsule Take 1 capsule (500 mg total) by mouth 3 (three) times daily. 03/21/23   Jimmy Charlie FERNS, MD  ciclopirox  (LOPROX ) 0.77 % cream Apply topically 2 (two) times daily. 02/04/22   Tower, Laine DELENA, MD  clopidogrel  (PLAVIX ) 75 MG tablet Take 1 tablet (75 mg total) by mouth daily with breakfast. 12/14/21   Jillian Buttery, MD  donepezil  (ARICEPT ) 10 MG tablet Take 1 tablet (10 mg total) by mouth at bedtime. 10/15/22   Rilla Baller, MD  Elderberry 575 MG/5ML SYRP Take 5 mLs by  mouth daily.    [provider]  folic acid  (FOLVITE ) 1 MG tablet Take 1 mg by mouth daily. 06/17/19   [provider]  memantine  (NAMENDA ) 10 MG tablet Take 1 tablet (10 mg total) by mouth 2 (two) times daily. 01/09/21   Tower, Laine DELENA, MD  methotrexate (RHEUMATREX) 2.5 MG tablet Take 15 mg by mouth once a week. Take 6 tablets (15 mg) on Saturdays 08/02/19   [provider]  mirabegron  ER (MYRBETRIQ ) 50 MG TB24 tablet Take 1 tablet by mouth daily. 02/27/23   [provider]  rosuvastatin  (CRESTOR ) 10 MG tablet Take 1 tablet (10 mg total) by mouth daily. 12/13/21   Adhikari, Amrit, MD  traZODone (DESYREL) 100 MG tablet Take 100 mg by mouth at bedtime. 01/22/23   [provider]  venlafaxine XR (EFFEXOR-XR) 75 MG 24 hr capsule Take by mouth. 08/28/22   [provider]      Allergies    Chlorzoxazone and Elemental sulfur    Review of Systems   Review of Systems A 10 point review of systems was performed and is negative unless otherwise reported in HPI.  Physical Exam Updated Vital Signs BP 134/87 (BP Location: Right Arm)   Pulse 75   Temp 97.8 F (36.6 C) (Oral)   Resp 20   Ht 6' 3 (1.905 m)   Wt 103 kg   SpO2  95%   BMI 28.38 kg/m  Physical Exam General: Normal appearing male, lying in bed.  HEENT: NCAT, PERRLA, Sclera anicteric, MMM, trachea midline.  Cardiology: RRR, no murmurs/rubs/gallops.  Mild right lower chest tenderness in the area of the right upper quadrant and the ribs in that region without any crepitus or deformity noted, no signs of gross trauma. Resp: Normal respiratory rate and effort. CTAB, no wheezes, rhonchi, crackles.  Abd: Soft, right upper quadrant tenderness palpation, epigastric tenderness palpation with a small 3 x 3 cm mildly tender ventral hernia without any overlying erythema, non-distended. No rebound tenderness or guarding.  GU: Deferred. MSK: No peripheral edema or signs of trauma. Extremities without  deformity or TTP. No cyanosis or clubbing. Skin: warm, dry.  Back: No CVA tenderness Neuro: A&Ox1 (baseline), CNs II-XII grossly intact. MAEs. Sensation grossly intact.  Psych: Normal mood and affect.   ED Results / Procedures / Treatments   Labs (all labs ordered are listed, but only abnormal results are displayed) Labs Reviewed  CBC WITH DIFFERENTIAL/PLATELET  COMPREHENSIVE METABOLIC PANEL WITH GFR  URINALYSIS, W/ REFLEX TO CULTURE (INFECTION SUSPECTED)  LIPASE, BLOOD    EKG None  Radiology No results found.  Procedures Procedures  {Document cardiac monitor, telemetry assessment procedure when appropriate:1}  Medications Ordered in ED Medications - No data to display  ED Course/ Medical Decision Making/ A&P                          Medical Decision Making   This patient presents to the ED for concern of ***, this involves an extensive number of treatment options, and is a complaint that carries with it a high risk of complications and morbidity.  I considered the following differential and admission for this acute, potentially life threatening condition.   MDM:    For DDX for abdominal pain includes but is not limited to:  Abdominal exam without peritoneal signs. No evidence of acute abdomen at this time.  With right upper quadrant pain consider hepatobiliary disease including acute cholecystitis or cholelithiasis, acute pancreatitis, strangulated hernia, PUD, vascular catastrophe, bowel obstruction.  Less likely viscus perforation or diverticulitis/appendicitis.  Also reportedly has recurrent history of UTIs, consider pyelonephritis or ureterolithiasis.  No reported trauma but also consider rib fractures.  No reported respiratory symptoms, shortness of breath, hypoxia but also consider right lower lobe pneumonia or pleural effusion.      Labs: I Ordered, and personally interpreted labs.  The pertinent results include: Those listed above  Imaging Studies ordered: I  ordered imaging studies including CT chest abdomen pelvis I independently visualized and interpreted imaging. I agree with the radiologist interpretation  Additional history obtained from chart review, wife at bedside.   Reevaluation: After the interventions noted above, I reevaluated the patient and found that they have :{resolved/improved/worsened:23923::improved}  Social Determinants of Health: Lives independently with wife as caretaker  Disposition:  ***  Co morbidities that complicate the patient evaluation History reviewed. No pertinent past medical history.   Medicines No orders of the defined types were placed in this encounter.   I have reviewed the patients home medicines and have made adjustments as needed  Problem List / ED Course: Problem List Items Addressed This Visit   None        {Document critical care time when appropriate:1} {Document review of labs and clinical decision tools ie heart score, Chads2Vasc2 etc:1}  {Document your independent review of radiology images, and any outside records:1} {  Document your discussion with family members, caretakers, and with consultants:1} {Document social determinants of health affecting pt's care:1} {Document your decision making why or why not admission, treatments were needed:1}  This note was created using dictation software, which may contain spelling or grammatical errors.

## 2024-06-09 ENCOUNTER — Encounter: Payer: Self-pay | Admitting: Family Medicine

## 2024-06-17 ENCOUNTER — Ambulatory Visit: Payer: Self-pay | Admitting: *Deleted

## 2024-06-17 NOTE — Telephone Encounter (Signed)
 Aware, will watch for correspondence Thanks for seeing him

## 2024-06-17 NOTE — Telephone Encounter (Signed)
 Appt scheduled with Mallie, NP tomorrow, will route to PCP and Mallie, NP as a [phone]

## 2024-06-17 NOTE — Telephone Encounter (Signed)
 FYI Only or Action Required?: FYI only for provider: appointment scheduled on 2/6.  Patient was last seen in primary care on 03/21/2023 by Jimmy Charlie FERNS, MD.  Called Nurse Triage reporting Hematuria.  Symptoms began yesterday.  Interventions attempted: Rest, hydration, or home remedies.  Symptoms are: unchanged.  Triage Disposition: See Physician Within 24 Hours  Patient/caregiver understands and will follow disposition?: Yes   Message from Antwanette L sent at 06/17/2024  2:37 PM EST  Reason for Triage:Troy [name] is calling bc the pt who has early stage dementia and wears depends was found wet this morning. There was blood in the depends and when he used the restroom again, blood was coming from  his penis.   Reason for Disposition  Blood in urine  (Exception: Could be normal menstrual bleeding.)  Answer Assessment - Initial Assessment Questions Patient's [name] calling with concern: patient has accident in bed last night- noticed blood in urine, and then today- blood in depends. Patient does not have any other complaints at this time. Patient is a dementia patient- [name] will collect sample at home before appointment. She does want provider to know sometimes patient will say inappropriate remarks.    1. COLOR of URINE: Describe the color of the urine.  (e.g., tea-colored, pink, red, bloody) Do you have blood clots in your urine? (e.g., none, pea, grape, small coin)     Stream started with blood in urine and eand with clear urine 2. ONSET: When did the bleeding start?      Last night 3. EPISODES: How many times has there been blood in the urine? or How many times today?     2 times- 2 times 4. PAIN with URINATION: Is there any pain with passing your urine? If Yes, ask: How bad is the pain?  (Scale 1-10; or mild, moderate, severe)     no 5. FEVER: Do you have a fever? If Yes, ask: What is your temperature, how was it measured, and when did it start?     No     97.7 6. ASSOCIATED SYMPTOMS: Are you passing urine more frequently than usual?     no 7. OTHER SYMPTOMS: Do you have any other symptoms? (e.g., back/flank pain, abdomen pain, vomiting)     no  Protocols used: Urine - Blood In-A-AH

## 2024-06-17 NOTE — Telephone Encounter (Signed)
 Noted, will evaluate.

## 2024-06-18 ENCOUNTER — Encounter: Payer: Self-pay | Admitting: Primary Care

## 2024-06-18 ENCOUNTER — Ambulatory Visit: Admitting: Primary Care

## 2024-06-18 VITALS — BP 122/66 | HR 84 | Temp 97.9°F | Ht 72.5 in | Wt 232.4 lb

## 2024-06-18 DIAGNOSIS — R319 Hematuria, unspecified: Secondary | ICD-10-CM

## 2024-06-18 DIAGNOSIS — R310 Gross hematuria: Secondary | ICD-10-CM

## 2024-06-18 LAB — POC URINALSYSI DIPSTICK (AUTOMATED)
Bilirubin, UA: NEGATIVE
Glucose, UA: NEGATIVE
Ketones, UA: NEGATIVE
Leukocytes, UA: NEGATIVE
Nitrite, UA: NEGATIVE
Protein, UA: NEGATIVE
Spec Grav, UA: 1.02
Urobilinogen, UA: 0.2 U/dL
pH, UA: 6

## 2024-06-18 NOTE — Patient Instructions (Signed)
 We will be in touch once the urine culture results return  It was a pleasure meeting you!

## 2024-06-18 NOTE — Addendum Note (Signed)
 Addended by: HOPE VEVA PARAS on: 06/18/2024 03:08 PM   Modules accepted: Orders

## 2024-06-18 NOTE — Progress Notes (Signed)
 "  Subjective:    Patient ID: Troy [name], male    DOB: 04-09-1946, 79 y.o.   MRN: [id number]  Troy [name] is a very pleasant 79 y.o. male patient of Dr. Randeen with a history of dementia, rheumatoid arthritis, CAD, CHF, urinary frequency, hematuria who presents today to discuss gross hematuria.  His wife joins us  today who provides information for HPI given his dementia  Symptom onset yesterday with blood on his depends.  His wife has a picture with her today his wife did notice gross hematuria as he urinated. He denies dysuria, urinary urgency, fevers, flank pain. His appetite is good, and there has been no unusual behavior.   Chronic history of microscopic hematuria with and without acute cystitis.  Last culture positive urinalysis on file was in June 2024, E. coli, 10,000 49,000 colonies without resistance.  Evaluated at [facility] ED on 04/25/2024 for right upper quadrant abdominal pain.  Urinalysis was with 1+ blood, otherwise negative.  He underwent CT chest/abdomen/pelvis which revealed bilateral nonobstructing renal stones, renal cysts, and was unremarkable for acute process.  On file, last evaluated last by [facility] in September 2024 for microscopic hematuria and LUTS.  Symptoms had improved on Myrbetriq .  He has undergone testing for gross and microscopic hematuria previously without malignant or life-threatening cause.   His wife states that he was actually evaluated by his Urologist in November and December 2025. No cause was found for his hematuria.  His wife tested his urine yesterday with an over-the-counter urine kit and saw that it may be positive for infection.   Review of Systems  Constitutional:  Negative for appetite change and fever.  Genitourinary:  Positive for hematuria. Negative for dysuria, flank pain and urgency.         History reviewed. No pertinent past medical history.  Social History   Socioeconomic History   Marital status: Married    Spouse  name: Adrien   Number of children: 3   Years of education: Not on file   Highest education level: Not on file  Occupational History   Not on file  Tobacco Use   Smoking status: Never    Passive exposure: Past   Smokeless tobacco: Never  Vaping Use   Vaping status: Never Used  Substance and Sexual Activity   Alcohol use: No    Comment: quit over 39 years ago   Drug use: No   Sexual activity: Not on file  Other Topics Concern   Not on file  Social History Narrative   Right handed   Lives in single story home with wife   Social Drivers of Health   Tobacco Use: Low Risk (06/18/2024)   Patient History    Smoking Tobacco Use: Never    Smokeless Tobacco Use: Never    Passive Exposure: Past  Financial Resource Strain: Low Risk (10/27/2023)   Overall Financial Resource Strain (CARDIA)    Difficulty of Paying Living Expenses: Not hard at all  Food Insecurity: Low Risk (04/21/2024)   Received from [facility]   Epic    Within the past 12 months, you worried that your food would run out before you got money to buy more: Never true    Within the past 12 months, the food you bought just didn't last and you didn't have money to get more. : Never true  Transportation Needs: No Transportation Needs (04/21/2024)   Received from [facility]    In the past 12  months, has lack of reliable transportation kept you from medical appointments, meetings, work or from getting things needed for daily living? : No  Physical Activity: Inactive (10/27/2023)   Exercise Vital Sign    Days of Exercise per Week: 0 days    Minutes of Exercise per Session: 0 min  Stress: No Stress Concern Present (10/27/2023)   [employer] of Occupational Health - Occupational Stress Questionnaire    Feeling of Stress: Not at all  Social Connections: Moderately Integrated (10/27/2023)   Social Connection and Isolation Panel    Frequency of Communication with Friends and Family: More than three times  a week    Frequency of Social Gatherings with Friends and Family: More than three times a week    Attends Religious Services: More than 4 times per year    Active Member of [employer] or Organizations: No    Attends Banker Meetings: Never    Marital Status: Married  Catering Manager Violence: Not At Risk (10/27/2023)   Epic    Fear of Current or Ex-Partner: No    Emotionally Abused: No    Physically Abused: No    Sexually Abused: No  Depression (PHQ2-9): Low Risk (10/27/2023)   Depression (PHQ2-9)    PHQ-2 Score: 1  Alcohol Screen: Low Risk (10/27/2023)   Alcohol Screen    Last Alcohol Screening Score (AUDIT): 0  Housing: Unknown (05/18/2024)   Received from [facility]   Epic    Unable to Pay for Housing in the Last Year: Not on file    Number of Times Moved in the Last Year: Not on file    At any time in the past 12 months, were you homeless or living in a shelter (including now)?: No  Utilities: Not At Risk (10/27/2023)   Epic    Threatened with loss of utilities: No  Health Literacy: Adequate Health Literacy (10/27/2023)   B1300 Health Literacy    Frequency of need for help with medical instructions: Never    Past Surgical History:  Procedure Laterality Date   BACK SURGERY  04/1998   CORONARY STENT INTERVENTION N/A 12/12/2021   Procedure: CORONARY STENT INTERVENTION;  Surgeon: Anner Alm ORN, MD;  Location: [facility] INVASIVE CV LAB;  Service: Cardiovascular;  Laterality: N/A;   LEFT HEART CATH AND CORONARY ANGIOGRAPHY N/A 12/12/2021   Procedure: LEFT HEART CATH AND CORONARY ANGIOGRAPHY;  Surgeon: Claudene Pacific, MD;  Location: [facility] INVASIVE CV LAB;  Service: Cardiovascular;  Laterality: N/A;   LIPOMA EXCISION  09/2009   ROTATOR CUFF REPAIR Left 2012   TONSILLECTOMY AND ADENOIDECTOMY  age 66    Family History  Problem Relation Age of Onset   Dementia Mother    Colon cancer Father 40    Allergies[1]  Medications Ordered Prior to Encounter[2]  BP 122/66    Pulse 84   Temp 97.9 F (36.6 C) (Oral)   Ht 6' 0.5 (1.842 m)   Wt 232 lb 6 oz (105.4 kg)   SpO2 95%   BMI 31.08 kg/m  Objective:   Physical Exam Constitutional:      Appearance: He is not ill-appearing.  Cardiovascular:     Rate and Rhythm: Normal rate.  Pulmonary:     Effort: Pulmonary effort is normal.  Skin:    General: Skin is warm and dry.  Neurological:     Mental Status: He is alert.     Comments: Able to participate some during HPI     Physical Exam  Assessment & Plan:  Gross hematuria Assessment & Plan: Reviewed patient photos today  Urinalysis today with 2+ blood, otherwise negative. Urine culture and microscopic ordered and pending.  Will await results as there is no evidence of infection upon urinalysis or examination today  His wife agrees.   Orders: -     Urine Microscopic -     Urine Culture  Hematuria, unspecified type Assessment & Plan: Reviewed patient photos today  Urinalysis today with 2+ blood, otherwise negative. Urine culture and microscopic ordered and pending.  Will await results as there is no evidence of infection upon urinalysis or examination today  His wife agrees.   Orders: -     POCT Urinalysis Dipstick (Automated)    Assessment and Plan Assessment & Plan         Comer MARLA Gaskins, NP       [1]  Allergies Allergen Reactions   Chlorzoxazone     REACTION: dizzy   Elemental Sulfur Rash  [2]  Current Outpatient Medications on File Prior to Visit  Medication Sig Dispense Refill   ciclopirox  (LOPROX ) 0.77 % cream Apply topically 2 (two) times daily. 30 g 3   donepezil  (ARICEPT ) 10 MG tablet Take 1 tablet (10 mg total) by mouth at bedtime.     Elderberry 575 MG/5ML SYRP Take 5 mLs by mouth daily.     folic acid  (FOLVITE ) 1 MG tablet Take 1 mg by mouth daily.     memantine  (NAMENDA ) 10 MG tablet Take 1 tablet (10 mg total) by mouth 2 (two) times daily. 60 tablet 5   methotrexate (RHEUMATREX)  2.5 MG tablet Take 15 mg by mouth once a week. Take 6 tablets (15 mg) on Saturdays     mirabegron  ER (MYRBETRIQ ) 50 MG TB24 tablet Take 1 tablet by mouth daily.     rosuvastatin  (CRESTOR ) 10 MG tablet Take 1 tablet (10 mg total) by mouth daily. 30 tablet 1   traZODone (DESYREL) 100 MG tablet Take 100 mg by mouth at bedtime.     venlafaxine XR (EFFEXOR-XR) 75 MG 24 hr capsule Take by mouth.     No current facility-administered medications on file prior to visit.   "

## 2024-06-18 NOTE — Assessment & Plan Note (Signed)
 Reviewed patient photos today  Urinalysis today with 2+ blood, otherwise negative. Urine culture and microscopic ordered and pending.  Will await results as there is no evidence of infection upon urinalysis or examination today  His wife agrees.

## 2024-10-28 ENCOUNTER — Ambulatory Visit
